# Patient Record
Sex: Male | Born: 1980
Health system: Southern US, Community
[De-identification: ages and names within clinical notes are randomized; demographics above are authoritative.]

## PROBLEM LIST (undated history)

## (undated) DIAGNOSIS — J45909 Unspecified asthma, uncomplicated: Secondary | ICD-10-CM

## (undated) DIAGNOSIS — B20 Human immunodeficiency virus [HIV] disease: Secondary | ICD-10-CM

## (undated) DIAGNOSIS — N186 End stage renal disease: Secondary | ICD-10-CM

## (undated) DIAGNOSIS — I10 Essential (primary) hypertension: Secondary | ICD-10-CM

## (undated) DIAGNOSIS — Z21 Asymptomatic human immunodeficiency virus [HIV] infection status: Secondary | ICD-10-CM

## (undated) HISTORY — DX: End stage renal disease: N18.6

## (undated) HISTORY — DX: Essential (primary) hypertension: I10

---

## 2017-04-28 ENCOUNTER — Inpatient Hospital Stay (HOSPITAL_COMMUNITY): Payer: Medicaid Other

## 2017-04-28 ENCOUNTER — Encounter (HOSPITAL_COMMUNITY): Payer: Self-pay | Admitting: Emergency Medicine

## 2017-04-28 ENCOUNTER — Emergency Department (HOSPITAL_COMMUNITY): Payer: Medicaid Other

## 2017-04-28 ENCOUNTER — Inpatient Hospital Stay (HOSPITAL_COMMUNITY)
Admission: EM | Admit: 2017-04-28 | Discharge: 2017-05-06 | DRG: 969 | Disposition: A | Discharge status: Home / Self Care | Payer: Medicaid Other | Attending: Oncology | Admitting: Oncology

## 2017-04-28 DIAGNOSIS — E860 Dehydration: Secondary | ICD-10-CM | POA: Diagnosis present

## 2017-04-28 DIAGNOSIS — R112 Nausea with vomiting, unspecified: Secondary | ICD-10-CM

## 2017-04-28 DIAGNOSIS — D631 Anemia in chronic kidney disease: Secondary | ICD-10-CM | POA: Diagnosis present

## 2017-04-28 DIAGNOSIS — R197 Diarrhea, unspecified: Secondary | ICD-10-CM

## 2017-04-28 DIAGNOSIS — K529 Noninfective gastroenteritis and colitis, unspecified: Secondary | ICD-10-CM | POA: Diagnosis present

## 2017-04-28 DIAGNOSIS — Z9114 Patient's other noncompliance with medication regimen: Secondary | ICD-10-CM | POA: Diagnosis not present

## 2017-04-28 DIAGNOSIS — I120 Hypertensive chronic kidney disease with stage 5 chronic kidney disease or end stage renal disease: Secondary | ICD-10-CM | POA: Diagnosis present

## 2017-04-28 DIAGNOSIS — R000 Tachycardia, unspecified: Secondary | ICD-10-CM | POA: Diagnosis not present

## 2017-04-28 DIAGNOSIS — J45909 Unspecified asthma, uncomplicated: Secondary | ICD-10-CM | POA: Diagnosis present

## 2017-04-28 DIAGNOSIS — N179 Acute kidney failure, unspecified: Secondary | ICD-10-CM | POA: Diagnosis present

## 2017-04-28 DIAGNOSIS — I10 Essential (primary) hypertension: Secondary | ICD-10-CM

## 2017-04-28 DIAGNOSIS — J948 Other specified pleural conditions: Secondary | ICD-10-CM

## 2017-04-28 DIAGNOSIS — A419 Sepsis, unspecified organism: Secondary | ICD-10-CM | POA: Diagnosis present

## 2017-04-28 DIAGNOSIS — J181 Lobar pneumonia, unspecified organism: Secondary | ICD-10-CM

## 2017-04-28 DIAGNOSIS — R1084 Generalized abdominal pain: Secondary | ICD-10-CM | POA: Diagnosis not present

## 2017-04-28 DIAGNOSIS — J189 Pneumonia, unspecified organism: Secondary | ICD-10-CM | POA: Diagnosis present

## 2017-04-28 DIAGNOSIS — N184 Chronic kidney disease, stage 4 (severe): Secondary | ICD-10-CM | POA: Diagnosis not present

## 2017-04-28 DIAGNOSIS — I471 Supraventricular tachycardia: Secondary | ICD-10-CM | POA: Diagnosis present

## 2017-04-28 DIAGNOSIS — D649 Anemia, unspecified: Secondary | ICD-10-CM | POA: Diagnosis not present

## 2017-04-28 DIAGNOSIS — B20 Human immunodeficiency virus [HIV] disease: Secondary | ICD-10-CM | POA: Diagnosis present

## 2017-04-28 DIAGNOSIS — N186 End stage renal disease: Secondary | ICD-10-CM | POA: Diagnosis present

## 2017-04-28 DIAGNOSIS — Z8701 Personal history of pneumonia (recurrent): Secondary | ICD-10-CM

## 2017-04-28 DIAGNOSIS — Z419 Encounter for procedure for purposes other than remedying health state, unspecified: Secondary | ICD-10-CM

## 2017-04-28 DIAGNOSIS — E871 Hypo-osmolality and hyponatremia: Secondary | ICD-10-CM | POA: Diagnosis present

## 2017-04-28 DIAGNOSIS — Z992 Dependence on renal dialysis: Secondary | ICD-10-CM | POA: Diagnosis not present

## 2017-04-28 DIAGNOSIS — R509 Fever, unspecified: Secondary | ICD-10-CM

## 2017-04-28 DIAGNOSIS — E872 Acidosis: Secondary | ICD-10-CM | POA: Diagnosis present

## 2017-04-28 DIAGNOSIS — R109 Unspecified abdominal pain: Secondary | ICD-10-CM

## 2017-04-28 DIAGNOSIS — R319 Hematuria, unspecified: Secondary | ICD-10-CM | POA: Diagnosis present

## 2017-04-28 HISTORY — DX: Essential (primary) hypertension: I10

## 2017-04-28 HISTORY — DX: Asymptomatic human immunodeficiency virus (hiv) infection status: Z21

## 2017-04-28 HISTORY — DX: Unspecified asthma, uncomplicated: J45.909

## 2017-04-28 HISTORY — DX: Human immunodeficiency virus (HIV) disease: B20

## 2017-04-28 LAB — CBC WITH DIFFERENTIAL/PLATELET
BASOS ABS: 0 10*3/uL (ref 0.0–0.1)
Basophils Relative: 0 %
EOS ABS: 0 10*3/uL (ref 0.0–0.7)
Eosinophils Relative: 0 %
HEMATOCRIT: 26.7 % — AB (ref 39.0–52.0)
HEMOGLOBIN: 9.2 g/dL — AB (ref 13.0–17.0)
LYMPHS PCT: 13 %
Lymphs Abs: 2.4 10*3/uL (ref 0.7–4.0)
MCH: 29.5 pg (ref 26.0–34.0)
MCHC: 34.5 g/dL (ref 30.0–36.0)
MCV: 85.6 fL (ref 78.0–100.0)
MONOS PCT: 12 %
Monocytes Absolute: 2.2 10*3/uL — ABNORMAL HIGH (ref 0.1–1.0)
NEUTROS ABS: 13.9 10*3/uL — AB (ref 1.7–7.7)
NEUTROS PCT: 75 %
Platelets: 150 10*3/uL (ref 150–400)
RBC: 3.12 MIL/uL — ABNORMAL LOW (ref 4.22–5.81)
RDW: 15 % (ref 11.5–15.5)
WBC: 18.5 10*3/uL — ABNORMAL HIGH (ref 4.0–10.5)

## 2017-04-28 LAB — GASTROINTESTINAL PANEL BY PCR, STOOL (REPLACES STOOL CULTURE)
ASTROVIRUS: NOT DETECTED
Adenovirus F40/41: NOT DETECTED
CAMPYLOBACTER SPECIES: NOT DETECTED
CRYPTOSPORIDIUM: NOT DETECTED
CYCLOSPORA CAYETANENSIS: NOT DETECTED
ENTEROTOXIGENIC E COLI (ETEC): NOT DETECTED
Entamoeba histolytica: NOT DETECTED
Enteroaggregative E coli (EAEC): NOT DETECTED
Enteropathogenic E coli (EPEC): NOT DETECTED
Giardia lamblia: NOT DETECTED
Norovirus GI/GII: NOT DETECTED
PLESIMONAS SHIGELLOIDES: NOT DETECTED
Rotavirus A: NOT DETECTED
SAPOVIRUS (I, II, IV, AND V): NOT DETECTED
SHIGA LIKE TOXIN PRODUCING E COLI (STEC): NOT DETECTED
Salmonella species: NOT DETECTED
Shigella/Enteroinvasive E coli (EIEC): NOT DETECTED
VIBRIO SPECIES: NOT DETECTED
Vibrio cholerae: NOT DETECTED
YERSINIA ENTEROCOLITICA: NOT DETECTED

## 2017-04-28 LAB — I-STAT CG4 LACTIC ACID, ED
LACTIC ACID, VENOUS: 1.68 mmol/L (ref 0.5–1.9)
Lactic Acid, Venous: 1.8 mmol/L (ref 0.5–1.9)

## 2017-04-28 LAB — URINALYSIS, ROUTINE W REFLEX MICROSCOPIC
Bilirubin Urine: NEGATIVE
Glucose, UA: NEGATIVE mg/dL
Ketones, ur: NEGATIVE mg/dL
LEUKOCYTES UA: NEGATIVE
Nitrite: NEGATIVE
Specific Gravity, Urine: 1.015 (ref 1.005–1.030)
pH: 7.5 (ref 5.0–8.0)

## 2017-04-28 LAB — C DIFFICILE QUICK SCREEN W PCR REFLEX
C DIFFICILE (CDIFF) INTERP: NOT DETECTED
C DIFFICILE (CDIFF) TOXIN: NEGATIVE
C Diff antigen: NEGATIVE

## 2017-04-28 LAB — CREATININE, URINE, RANDOM: CREATININE, URINE: 114.63 mg/dL

## 2017-04-28 LAB — COMPREHENSIVE METABOLIC PANEL
ALBUMIN: 2.5 g/dL — AB (ref 3.5–5.0)
ALT: 14 U/L — ABNORMAL LOW (ref 17–63)
ANION GAP: 16 — AB (ref 5–15)
AST: 33 U/L (ref 15–41)
Alkaline Phosphatase: 124 U/L (ref 38–126)
BUN: 102 mg/dL — AB (ref 6–20)
CHLORIDE: 97 mmol/L — AB (ref 101–111)
CO2: 15 mmol/L — ABNORMAL LOW (ref 22–32)
Calcium: 8.5 mg/dL — ABNORMAL LOW (ref 8.9–10.3)
Creatinine, Ser: 15.14 mg/dL — ABNORMAL HIGH (ref 0.61–1.24)
GFR calc Af Amer: 4 mL/min — ABNORMAL LOW (ref >60)
GFR calc non Af Amer: 4 mL/min — ABNORMAL LOW (ref >60)
GLUCOSE: 119 mg/dL — AB (ref 65–99)
POTASSIUM: 4.4 mmol/L (ref 3.5–5.1)
Sodium: 128 mmol/L — ABNORMAL LOW (ref 135–145)
TOTAL PROTEIN: 8.2 g/dL — AB (ref 6.5–8.1)
Total Bilirubin: 0.6 mg/dL (ref 0.3–1.2)

## 2017-04-28 LAB — URINALYSIS, MICROSCOPIC (REFLEX)

## 2017-04-28 LAB — SODIUM, URINE, RANDOM: SODIUM UR: 42 mmol/L

## 2017-04-28 LAB — LIPASE, BLOOD: Lipase: 132 U/L — ABNORMAL HIGH (ref 11–51)

## 2017-04-28 LAB — PROTIME-INR
INR: 1.16
PROTHROMBIN TIME: 14.9 s (ref 11.4–15.2)

## 2017-04-28 LAB — POC OCCULT BLOOD, ED: Fecal Occult Bld: NEGATIVE

## 2017-04-28 MED ORDER — ONDANSETRON HCL 4 MG PO TABS
4.0000 mg | ORAL_TABLET | Freq: Four times a day (QID) | ORAL | Status: DC | PRN
  Administered 2017-04-30 – 2017-05-03 (×2): 4 mg via ORAL
  Filled 2017-04-28: qty 1

## 2017-04-28 MED ORDER — PANTOPRAZOLE SODIUM 40 MG IV SOLR
40.0000 mg | Freq: Once | INTRAVENOUS | Status: AC
  Administered 2017-04-28: 40 mg via INTRAVENOUS
  Filled 2017-04-28: qty 40

## 2017-04-28 MED ORDER — SODIUM CHLORIDE 0.9 % IV BOLUS (SEPSIS)
1000.0000 mL | Freq: Once | INTRAVENOUS | Status: AC
  Administered 2017-04-28: 1000 mL via INTRAVENOUS

## 2017-04-28 MED ORDER — PIPERACILLIN-TAZOBACTAM 3.375 G IVPB 30 MIN
3.3750 g | Freq: Once | INTRAVENOUS | Status: AC
  Administered 2017-04-28: 3.375 g via INTRAVENOUS
  Filled 2017-04-28: qty 50

## 2017-04-28 MED ORDER — HYDROMORPHONE HCL 1 MG/ML IJ SOLN
0.5000 mg | INTRAMUSCULAR | Status: AC | PRN
  Administered 2017-04-28 – 2017-04-29 (×3): 0.5 mg via INTRAVENOUS
  Filled 2017-04-28 (×3): qty 0.5

## 2017-04-28 MED ORDER — GI COCKTAIL ~~LOC~~
30.0000 mL | Freq: Three times a day (TID) | ORAL | Status: DC | PRN
  Administered 2017-04-29: 30 mL via ORAL
  Filled 2017-04-28 (×2): qty 30

## 2017-04-28 MED ORDER — PIPERACILLIN-TAZOBACTAM 3.375 G IVPB
3.3750 g | Freq: Two times a day (BID) | INTRAVENOUS | Status: DC
  Administered 2017-04-28 – 2017-05-03 (×9): 3.375 g via INTRAVENOUS
  Filled 2017-04-28 (×11): qty 50

## 2017-04-28 MED ORDER — NEPRO/CARBSTEADY PO LIQD
237.0000 mL | Freq: Two times a day (BID) | ORAL | Status: DC
  Filled 2017-04-28 (×4): qty 237

## 2017-04-28 MED ORDER — ORAL CARE MOUTH RINSE
15.0000 mL | Freq: Two times a day (BID) | OROMUCOSAL | Status: DC
  Administered 2017-04-28 – 2017-05-05 (×12): 15 mL via OROMUCOSAL

## 2017-04-28 MED ORDER — VANCOMYCIN HCL 10 G IV SOLR
2000.0000 mg | Freq: Once | INTRAVENOUS | Status: AC
  Administered 2017-04-28: 2000 mg via INTRAVENOUS
  Filled 2017-04-28: qty 2000

## 2017-04-28 MED ORDER — ZOLPIDEM TARTRATE 5 MG PO TABS
5.0000 mg | ORAL_TABLET | Freq: Every evening | ORAL | Status: DC | PRN
  Administered 2017-04-28 – 2017-05-01 (×4): 5 mg via ORAL
  Filled 2017-04-28 (×4): qty 1

## 2017-04-28 MED ORDER — HEPARIN SODIUM (PORCINE) 5000 UNIT/ML IJ SOLN
5000.0000 [IU] | Freq: Three times a day (TID) | INTRAMUSCULAR | Status: DC
  Administered 2017-04-28 – 2017-05-06 (×22): 5000 [IU] via SUBCUTANEOUS
  Filled 2017-04-28 (×23): qty 1

## 2017-04-28 MED ORDER — SODIUM CHLORIDE 0.9% FLUSH
3.0000 mL | Freq: Two times a day (BID) | INTRAVENOUS | Status: DC
  Administered 2017-04-28 – 2017-05-05 (×11): 3 mL via INTRAVENOUS

## 2017-04-28 MED ORDER — ACETAMINOPHEN 650 MG RE SUPP: 650.0000 mg | Freq: Four times a day (QID) | RECTAL | Status: DC | PRN

## 2017-04-28 MED ORDER — ONDANSETRON HCL 4 MG/2ML IJ SOLN
4.0000 mg | Freq: Four times a day (QID) | INTRAMUSCULAR | Status: DC | PRN
  Administered 2017-04-29: 4 mg via INTRAVENOUS
  Filled 2017-04-28 (×2): qty 2

## 2017-04-28 MED ORDER — SODIUM CHLORIDE 0.9 % IV BOLUS (SEPSIS)
500.0000 mL | Freq: Once | INTRAVENOUS | Status: AC
  Administered 2017-04-28: 500 mL via INTRAVENOUS

## 2017-04-28 MED ORDER — ACETAMINOPHEN 325 MG PO TABS
650.0000 mg | ORAL_TABLET | Freq: Four times a day (QID) | ORAL | Status: DC | PRN
  Administered 2017-04-28 – 2017-05-06 (×3): 650 mg via ORAL
  Filled 2017-04-28 (×2): qty 2

## 2017-04-28 MED ORDER — SODIUM BICARBONATE 8.4 % IV SOLN
INTRAVENOUS | Status: DC
  Administered 2017-04-28 – 2017-04-29 (×3): via INTRAVENOUS
  Filled 2017-04-28 (×8): qty 150

## 2017-04-28 MED ORDER — GI COCKTAIL ~~LOC~~
30.0000 mL | Freq: Once | ORAL | Status: AC
Start: 2017-04-28 — End: 2017-04-28
  Administered 2017-04-28: 30 mL via ORAL
  Filled 2017-04-28: qty 30

## 2017-04-28 MED ORDER — ALBUTEROL SULFATE (2.5 MG/3ML) 0.083% IN NEBU
2.5000 mg | INHALATION_SOLUTION | RESPIRATORY_TRACT | Status: DC | PRN
  Administered 2017-04-28: 2.5 mg via RESPIRATORY_TRACT
  Filled 2017-04-28 (×2): qty 3

## 2017-04-28 NOTE — ED Notes (Signed)
Hospitalist at bedside 

## 2017-04-28 NOTE — ED Provider Notes (Signed)
[facility]EMER[facility]ENCY DEPT Provider Note   CSN: [id number] Arrival date & time: 04/28/17  [id number]     History   Chief Complaint Chief Complaint  Patient presents with  . Shortness of Breath    HPI James Schwartz is a 36 y.o. male.   He presents for evaluation of frequent episodes of nausea, vomiting, diarrhea. Vomiting. Appears like whatever he ate. Stool is like water and "black." No reported fever, chills, diaphoresis, chest pain, shortness of breath, or abdominal pain. He has HIV disease and states he is compliant with his medications. He is visiting from [place], here in [place]. There are no other known modifying factors.   HPI  Past Medical History:  Diagnosis Date  . Asthma   . HIV (human immunodeficiency virus infection) (HCC)     There are no active problems to display for this patient.   History reviewed. No pertinent surgical history.     Home Medications    Prior to Admission medications   Not on File    Family History History reviewed. No pertinent family history.  Social History Social History  Substance Use Topics  . Smoking status: Never Smoker  . Smokeless tobacco: Never Used  . Alcohol use No     Allergies   Patient has no known allergies.   Review of Systems Review of Systems  All other systems reviewed and are negative.    Physical Exam Updated Vital Signs BP (!) 151/83   Pulse (!) 124   Temp (!) 102.8 F (39.3 C) (Oral)   Resp (!) 29   Ht 6\' 1"  (1.854 m)   Wt 136.1 kg (300 lb)   SpO2 100%   BMI 39.58 kg/m   Physical Exam  Constitutional: He is oriented to person, place, and time. He appears well-developed. No distress.  Appears older than stated age.  HENT:  Head: Normocephalic and atraumatic.  Right Ear: External ear normal.  Left Ear: External ear normal.  Eyes: Pupils are equal, round, and reactive to light. Conjunctivae and EOM are normal.  Neck: Normal range of motion and phonation normal. Neck supple.    Cardiovascular: Normal rate, regular rhythm and normal heart sounds.   Pulmonary/Chest: Effort normal and breath sounds normal. He exhibits no bony tenderness.  Abdominal: Soft. He exhibits no distension. There is no tenderness. There is no guarding.  Musculoskeletal: Normal range of motion.  Neurological: He is alert and oriented to person, place, and time. No cranial nerve deficit or sensory deficit. He exhibits normal muscle tone. Coordination normal.  Skin: Skin is warm, dry and intact.  Psychiatric: He has a normal mood and affect. His behavior is normal. Judgment and thought content normal.  Nursing note and vitals reviewed.    ED Treatments / Results  Labs (all labs ordered are listed, but only abnormal results are displayed) Labs Reviewed  COMPREHENSIVE METABOLIC PANEL - Abnormal; Notable for the following:       Result Value   Sodium 128 (*)    Chloride 97 (*)    CO2 15 (*)    Glucose, Bld 119 (*)    BUN 102 (*)    Creatinine, Ser 15.14 (*)    Calcium 8.5 (*)    Total Protein 8.2 (*)    Albumin 2.5 (*)    ALT 14 (*)    GFR calc non Af Amer 4 (*)    GFR calc Af Amer 4 (*)    Anion gap 16 (*)    All other  components within normal limits  CBC WITH DIFFERENTIAL/PLATELET - Abnormal; Notable for the following:    WBC 18.5 (*)    RBC 3.12 (*)    Hemoglobin 9.2 (*)    HCT 26.7 (*)    Neutro Abs 13.9 (*)    Monocytes Absolute 2.2 (*)    All other components within normal limits  C DIFFICILE QUICK SCREEN W PCR REFLEX  CULTURE, BLOOD (ROUTINE X 2)  CULTURE, BLOOD (ROUTINE X 2)  URINE CULTURE  GASTROINTESTINAL PANEL BY PCR, STOOL (REPLACES STOOL CULTURE)  PROTIME-INR  URINALYSIS, ROUTINE W REFLEX MICROSCOPIC  I-STAT CG4 LACTIC ACID, ED  POC OCCULT BLOOD, ED  I-STAT CG4 LACTIC ACID, ED    EKG  EKG Interpretation  Date/Time:  Sunday April 28 2017 09:47:59 EDT Ventricular Rate:  150 PR Interval:    QRS Duration: 76 QT Interval:  264 QTC Calculation: 417 R  Axis:   33 Text Interpretation:  Sinus tachycardia Borderline T abnormalities, diffuse leads since last tracing no significant change Confirmed by Mancel Bale [phone]) on 04/28/2017 10:06:37 AM       Radiology Dg Chest Port 1 View  Result Date: 04/28/2017 CLINICAL DATA:  Shortness of breath.  Abdominal pain. EXAM: PORTABLE CHEST 1 VIEW COMPARISON:  None. FINDINGS: There is a focal opacity at the right base. The opacity is hazy and subpleural. Lung volumes are low. Cardiomegaly and vascular pedicle widening that is accentuated by low volumes and rotation. IMPRESSION: 1. Subpleural opacity at the right base with chart history of fever and sepsis favoring pneumonia over infarct. 2. Cardiomegaly and vascular pedicle widening accentuated by low volumes and rotation. Electronically Signed   By: Marnee Spring M.D.   On: 04/28/2017 10:24    Procedures Procedures (including critical care time)  Medications Ordered in ED Medications  vancomycin (VANCOCIN) 2,000 mg in sodium chloride 0.9 % 500 mL IVPB (2,000 mg Intravenous New Bag/Given 04/28/17 1133)  piperacillin-tazobactam (ZOSYN) IVPB 3.375 g (not administered)  sodium chloride 0.9 % bolus 1,000 mL (0 mLs Intravenous Stopped 04/28/17 1156)    And  sodium chloride 0.9 % bolus 1,000 mL (0 mLs Intravenous Stopped 04/28/17 1156)    And  sodium chloride 0.9 % bolus 1,000 mL (1,000 mLs Intravenous New Bag/Given 04/28/17 1133)    And  sodium chloride 0.9 % bolus 1,000 mL (1,000 mLs Intravenous New Bag/Given 04/28/17 1206)    And  sodium chloride 0.9 % bolus 500 mL (500 mLs Intravenous New Bag/Given 04/28/17 1217)  gi cocktail (Maalox,Lidocaine,Donnatal) (30 mLs Oral Given 04/28/17 1041)  pantoprazole (PROTONIX) injection 40 mg (40 mg Intravenous Given 04/28/17 1041)  piperacillin-tazobactam (ZOSYN) IVPB 3.375 g (0 g Intravenous Stopped 04/28/17 1133)     Initial Impression / Assessment and Plan / ED Course  I have reviewed the triage vital signs and the nursing  notes.  Pertinent labs & imaging results that were available during my care of the patient were reviewed by me and considered in my medical decision making (see chart for details).  Clinical Course as of Apr 28 1318  Sun Apr 28, 2017  1316 High WBC: (!) 18.5 [EW]  1317 Low Hemoglobin: (!) 9.2 [EW]  1317 Low Sodium: (!) 128 [EW]  1317 Low, consistent with acute renal failure CO2: (!) 15 [EW]  1317 High Creatinine: (!) 15.14 [EW]  1318 Sub-pleural mass, right DG Chest Port 1 View [EW]    Clinical Course User Index [EW] Mancel Bale, MD     Patient Vitals for the  past 24 hrs:  BP Temp Temp src Pulse Resp SpO2 Height Weight  04/28/17 1115 (!) 151/83 - - (!) 124 (!) 29 100 % - -  04/28/17 1000 (!) 143/111 - - (!) 146 (!) 23 94 % - -  04/28/17 0946 - - - - - - 6\' 1"  (1.854 m) 136.1 kg (300 lb)  04/28/17 0933 (!) 157/95 (!) 102.8 F (39.3 C) Oral (!) 135 (!) 27 100 % - -      1:08 PM Reevaluation with update and discussion. After initial assessment and treatment, an updated evaluation reveals No change in clinical status.Mancel Bale. Javeon Macmurray L    1:16 PM-Consult complete with On-call teaching service resident. Patient case explained and discussed. He agrees to admit patient for further evaluation and treatment. Call ended at 14:45  Final Clinical Impressions(s) / ED Diagnoses   Final diagnoses:  Sepsis, due to unspecified organism (HCC)  AKI (acute kidney injury) (HCC)  Nausea vomiting and diarrhea  HIV (human immunodeficiency virus infection) (HCC)  Pleural mass   Nausea vomiting and diarrhea with fever. Patient with acute renal failure, likely dehydrated. Baseline creatinine, prior labs in [place], last year, the creatinine was 4. Suspected acute on chronic renal failure. Initial lactate normal. Patient will require admission for monitoring.  Nursing Notes Reviewed/ Care Coordinated Applicable Imaging Reviewed Interpretation of Laboratory Data incorporated into ED  treatment   Plan: Admit   New Prescriptions New Prescriptions   No medications on file     Mancel BaleWentz, Toua Stites, MD 04/28/17 1530

## 2017-04-28 NOTE — Progress Notes (Signed)
Patient arrived on unit via stretcher from ED.  No family at bedside.  Telemetry placed per MD order and CMT notified.  

## 2017-04-28 NOTE — H&P (Signed)
Date: 04/28/2017               Patient Name:  James Schwartz MRN: [id number]  DOB: 1981-07-20 Age / Sex: 36 y.o., male   PCP: No primary care provider on file.              Medical Service: Internal Medicine Teaching Service              Attending Physician: Dr. Cyndie ChimeGranfortuna, Genene ChurnJames M, Schwartz    First Contact: James GeraldsKalyani Avva, MS OMS-IV Pager: [phone]  Second Contact: Dr. Gwynn BurlyAndrew Marayah Schwartz Pager: [phone]            After Hours (After 5p/  First Contact Pager: [phone]  weekends / holidays): Second Contact Pager: [phone]   Chief Complaint: Abdominal Pain, Diarrhea, Vomitting  History of Present Illness: James Schwartz is a 36 yo [place] Male with a medical history significant for HIV, Chronic Kidney Disease, and asthma. [facility] with a 6 day history of abdominal pain, diarrhea, vomiting. Patient is from [place] and currently visiting his wife in [place]. He said that his symptoms began on 7/31, one day after his arrival and evening meal at a seafood buffet.   He states that his pain is a 6/10 and has gotten better since he has been in the hospital. He has had poor oral intake during this time and has had a fever and chills.  He states that he has been having up to 10 loose bowel movements a day, which are void of blood and mucus. He denies any hematemesis. His symptoms are also accompanied by a cough. He is also having right peri-scapular pain.   He also states that he has been non-compliant with his anti-retrovirals for the past 2 months. He denies any chest pain, difficulty breathing, new-onset weakness,or headache.    While in the ER, he was febrile (tmax 102.8) and tachycardic ( 146bpm) and had a leukocytosis 18.5. CXR revealed an opacity at the base of the lower lung. Blood cultures were drawn, and he was started on vancomycin and zosyn and given 4.5 L of NS .  UA was unremarkable for infectious causes, but significant for hematuria and proteinuria His BUN was 102 and  creatinine is 15.12 (labs from [place] 1 year ago show creatinine of ~ 4). He was admitted to the general medical floor for sepsis secondary to acute gastroenteritis as well as acute on chronic renal failure.  Meds: Current Facility-Administered Medications  Medication Dose Route Frequency Provider Last Rate Last Dose  . piperacillin-tazobactam (ZOSYN) IVPB 3.375 g  3.375 g Intravenous Q12H Deja, Roderic Scarcerin N, RPH       No current outpatient prescriptions on file.    Allergies: Allergies as of 04/28/2017  . (No Known Allergies)   Past Medical History:  Diagnosis Date  . Asthma   . HIV (human immunodeficiency virus infection) (HCC)    History reviewed. No pertinent surgical history. History reviewed. No pertinent family history. Social History   Social History  . Marital status: Single    Spouse name: N/A  . Number of children: N/A  . Years of education: N/A   Occupational History  . Not on file.   Social History Main Topics  . Smoking status: Never Smoker  . Smokeless tobacco: Never Used  . Alcohol use No  . Drug use: No  . Sexual activity: Not on file   Other Topics Concern  . Not on file   Social History  Narrative  . No narrative on file    Review of Systems: Pertinent items noted in HPI and remainder of comprehensive ROS otherwise negative.  Physical Exam: Blood pressure (!) 166/120, pulse (!) 125, temperature 100 F (37.8 C), temperature source Oral, resp. rate (!) 26, height 6\' 1"  (1.854 m), weight 136.1 kg (300 lb), SpO2 96 %. BP (!) 166/120   Pulse (!) 125   Temp 100 F (37.8 C) (Oral)   Resp (!) 26   Ht 6\' 1"  (1.854 m)   Wt 136.1 kg (300 lb)   SpO2 96%   BMI 39.58 kg/m  General appearance: alert, cooperative, mild distress and mildly obese Head: Normocephalic, without obvious abnormality, atraumatic Eyes: PERLA. Sclera anicteric Ears: normal TM's and external ear canals both ears Throat: lips, mucosa, and tongue normal; teeth and gums normal Neck: no  adenopathy and supple, symmetrical, trachea midline Lungs: clear to auscultation bilaterally Heart: tachycardic, S1 S2 present, no murmurs rubs or gallops Abdomen: soft, Bowel sounds present . Tenderness to palpation ellicited in epigastric region Extremities: extremities normal, atraumatic, no cyanosis or edema   Imaging results:  Dg Chest Port 1 View  Result Date: 04/28/2017 CLINICAL DATA:  Shortness of breath.  Abdominal pain. EXAM: PORTABLE CHEST 1 VIEW COMPARISON:  None. FINDINGS: There is a focal opacity at the right base. The opacity is hazy and subpleural. Lung volumes are low. Cardiomegaly and vascular pedicle widening that is accentuated by low volumes and rotation. IMPRESSION: 1. Subpleural opacity at the right base with chart history of fever and sepsis favoring pneumonia over infarct. 2. Cardiomegaly and vascular pedicle widening accentuated by low volumes and rotation. Electronically Signed   By: Marnee SpringJonathon  Watts M.D.   On: 04/28/2017 10:24    Other results: EKG: sinus tachycardia.  Assessment & Plan by Problem:  Sepsis Secondary to Acute Gastroenteritis   Patient presented with a fever,tachycardia, leukocytosis accompanied by a 6 day history of gastrointestinal symptoms. GI panel ordered to evaluate potential infectious causes. Lipase for potential pancreatitis. Blood cultures were drawn. C.diff toxin was negative.  Patient on empiric zosyn and discontinue Vancomycin, will deescalate based on culture and sensitivity results. PRN Gi coctails Monitoring CBC and CMP daily. Vitals Q6hrs.   Acute on chronic renal failure  Patient's creatinine was 15.1 today up from 3.9 in March 2017. He states known chronic kidney disease. Possibly due to sepsis or ATN. Renal ultrasound consistent with medical renal disease. Patient still able to urinate.  Given severity of disease and lack of prior history nephrology will be consulted tomorrow morning.  No current indication for emergent HD. Holding  all nephrotoxic medications. Monitoring BMP daily.  Strict I/Os. It is unclear what is current baseline creatinine is.  Continue IVF to treat underlying pre-renal physiology that may be contributing to renal failure.  Pneumonia   CXR upon admission revealed possible right lower lobe opacity. Patients sepsis could be due to pneumonia but gastroenteritis is more likely due to patients presenting symptoms. Checking pneumococcal.  Patient on empiric zosyn for now. Checking legionella antigen due to the patients respiratory symptoms and diarrhea.   Normocytic Anemia   Patients Hgb was 9.2 on admission with an MCV of 85.6. Given patient HIV status and chronic kidney disease, anemia most likely due to chronic disease rather than IDA given no reports of blood loss.  Hgb was noted to be 13 in March 2017.   Will save smear for review.  Anion Gap Acidosis   On admission  Bicarb levels  were 15 and his anion gap was 16 indicating an anion gap acidosis. Most likely due to his underlying uremia. Will monitor Bmp daily.  Treat acidosis with NaHco3 gtt  HIV    Patient stated that he was diagnosed in 2002. Currently non-compliant with anti-retroviral therapy. Checking HIV viral load and CD4 count to establish disease burden. Last CD4 48 and VL > 113,000 in March 2017 available through Care Everywhere.  He is unsure of his medication regimen that he should be on.   Disposition: Admit patient to inpatient floor. Estimated length of stay >2days. Will consider discharge once the patient has clinical and symptomatic improvement.   This is a Psychologist, occupational Note.  The care of the patient was discussed with Dr. Earlene Plater and the assessment and plan was formulated with their assistance.  Please see their note for official documentation of the patient encounter.   Signed: Harvie Junior, Medical Student 04/28/2017, 4:24 PM   Attestation for Student Documentation:  I personally was present and performed or re-performed the  history, physical exam and medical decision-making activities of this service and have verified that the service and findings are accurately documented in the student's note.  James Burly, DO 04/28/2017, 6:49 PM

## 2017-04-28 NOTE — ED Triage Notes (Signed)
Pt from home- SOB for several days. N/V/D. Pt went out to eat on Monday and started feeling these symptoms afterwards. Pt has hx asthma. EMS gave total 10 mg albuterol and 0.5mg  atrovent. 4mg  zofran po. BP 150/122, HR 132, 97% on room air, CBG 114. EMS reports wheezing.

## 2017-04-28 NOTE — Progress Notes (Signed)
Pharmacy Antibiotic Note  James PercyMaurice Schwartz is a 36 y.o. male admitted on 04/28/2017 with sepsis.  Pharmacy has been consulted for vancomycin and Zosyn dosing. CMP and CBC still in progress. Tmax 102.37F.  Plan: Vancomycin 2 g IV x1 Zosyn 3.375 g IV x1 F/u lab results and determine maintenance dosing  ADDENDUM: Scr resulted 15.14, est CrCl ~2810mL/min.  Will not schedule vancomycin at this time. F/u renal function, may check a random level. Will schedule zosyn 3.375 g IV q12h (4 hour infusion). Monitor renal function for improvement and dose adjust accordingly.  Height: 6\' 1"  (185.4 cm) Weight: 300 lb (136.1 kg) IBW/kg (Calculated) : 79.9  Temp (24hrs), Avg:102.8 F (39.3 C), Min:102.8 F (39.3 C), Max:102.8 F (39.3 C)   Recent Labs Lab 04/28/17 0958  LATICACIDVEN 1.68    CrCl cannot be calculated (No order found.).    No Known Allergies  Microbiology results: 8/5 BCx:  8/5 UCx:  8/5 GI panel:   8/5 C.diff PCR:   Thank you for allowing pharmacy to be a part of this patient's care.   Roderic ScarceErin N. Zigmund Danieleja, PharmD PGY1 Pharmacy Resident Pager: [phone]  04/28/2017 10:02 AM

## 2017-04-28 NOTE — ED Notes (Signed)
Patient transported to Ultrasound 

## 2017-04-28 NOTE — ED Notes (Signed)
Pt aware he needs to provide urine sample. Pt informed RN he urinated when he defecated and was unable to get sample. Will try again as soon as possible. RN bladder scanned pt- 0mL as pt has just urinated

## 2017-04-29 ENCOUNTER — Inpatient Hospital Stay (HOSPITAL_COMMUNITY): Payer: Medicaid Other

## 2017-04-29 HISTORY — PX: IR US GUIDE VASC ACCESS RIGHT: IMG2390

## 2017-04-29 HISTORY — PX: IR FLUORO GUIDE CV LINE RIGHT: IMG2283

## 2017-04-29 LAB — CBC WITH DIFFERENTIAL/PLATELET
BASOS PCT: 0 %
Band Neutrophils: 1 %
Basophils Absolute: 0 10*3/uL (ref 0.0–0.1)
Blasts: 0 %
EOS PCT: 1 %
Eosinophils Absolute: 0.1 10*3/uL (ref 0.0–0.7)
HEMATOCRIT: 23.6 % — AB (ref 39.0–52.0)
HEMOGLOBIN: 8 g/dL — AB (ref 13.0–17.0)
Lymphocytes Relative: 7 %
Lymphs Abs: 0.8 10*3/uL (ref 0.7–4.0)
MCH: 28.9 pg (ref 26.0–34.0)
MCHC: 33.9 g/dL (ref 30.0–36.0)
MCV: 85.2 fL (ref 78.0–100.0)
MONO ABS: 0.8 10*3/uL (ref 0.1–1.0)
MYELOCYTES: 0 %
Metamyelocytes Relative: 0 %
Monocytes Relative: 7 %
NEUTROS PCT: 84 %
NRBC: 0 /100{WBCs}
Neutro Abs: 9 10*3/uL — ABNORMAL HIGH (ref 1.7–7.7)
OTHER: 0 %
PROMYELOCYTES ABS: 0 %
Platelets: 127 10*3/uL — ABNORMAL LOW (ref 150–400)
RBC: 2.77 MIL/uL — AB (ref 4.22–5.81)
RDW: 15.2 % (ref 11.5–15.5)
WBC: 10.7 10*3/uL — AB (ref 4.0–10.5)

## 2017-04-29 LAB — RENAL FUNCTION PANEL
ANION GAP: 13 (ref 5–15)
Albumin: 2 g/dL — ABNORMAL LOW (ref 3.5–5.0)
BUN: 101 mg/dL — ABNORMAL HIGH (ref 6–20)
CALCIUM: 7.8 mg/dL — AB (ref 8.9–10.3)
CHLORIDE: 102 mmol/L (ref 101–111)
CO2: 18 mmol/L — AB (ref 22–32)
Creatinine, Ser: 14.37 mg/dL — ABNORMAL HIGH (ref 0.61–1.24)
GFR calc non Af Amer: 4 mL/min — ABNORMAL LOW (ref >60)
GFR, EST AFRICAN AMERICAN: 4 mL/min — AB (ref >60)
GLUCOSE: 106 mg/dL — AB (ref 65–99)
POTASSIUM: 4.4 mmol/L (ref 3.5–5.1)
Phosphorus: 7.3 mg/dL — ABNORMAL HIGH (ref 2.5–4.6)
SODIUM: 133 mmol/L — AB (ref 135–145)

## 2017-04-29 LAB — URINE CULTURE

## 2017-04-29 LAB — HIV-1 RNA QUANT-NO REFLEX-BLD
HIV 1 RNA Quant: 258000 copies/mL
LOG10 HIV-1 RNA: 5.412 {Log_copies}/mL

## 2017-04-29 LAB — LEGIONELLA PNEUMOPHILA SEROGP 1 UR AG: L. pneumophila Serogp 1 Ur Ag: NEGATIVE

## 2017-04-29 LAB — T-HELPER CELLS (CD4) COUNT (NOT AT ARMC)
CD4 % Helper T Cell: 2 % — ABNORMAL LOW (ref 33–55)
CD4 T Cell Abs: 30 /uL — ABNORMAL LOW (ref 400–2700)

## 2017-04-29 LAB — SAVE SMEAR

## 2017-04-29 MED ORDER — NITROGLYCERIN 0.4 MG SL SUBL
SUBLINGUAL_TABLET | SUBLINGUAL | Status: AC
  Administered 2017-04-29: 21:00:00
  Administered 2017-04-29: 0.4 mg
  Filled 2017-04-29: qty 1

## 2017-04-29 MED ORDER — DEXTROSE 5 % IV SOLN
5.0000 mg/h | INTRAVENOUS | Status: DC
  Administered 2017-04-29: 5 mg/h via INTRAVENOUS
  Administered 2017-04-30: 15 mg/h via INTRAVENOUS
  Administered 2017-05-01: 10 mg/h via INTRAVENOUS
  Filled 2017-04-29 (×7): qty 100

## 2017-04-29 MED ORDER — AZITHROMYCIN 250 MG PO TABS
250.0000 mg | ORAL_TABLET | Freq: Every day | ORAL | Status: DC
  Administered 2017-04-30: 250 mg via ORAL
  Filled 2017-04-29: qty 1

## 2017-04-29 MED ORDER — HEPARIN SODIUM (PORCINE) 1000 UNIT/ML IJ SOLN
INTRAMUSCULAR | Status: AC
  Administered 2017-04-29: 2.8 mL
  Filled 2017-04-29: qty 1

## 2017-04-29 MED ORDER — LIDOCAINE HCL (PF) 1 % IJ SOLN
INTRAMUSCULAR | Status: DC | PRN
  Administered 2017-04-29: 10 mL

## 2017-04-29 MED ORDER — AZITHROMYCIN 500 MG PO TABS
500.0000 mg | ORAL_TABLET | Freq: Every day | ORAL | Status: AC
  Administered 2017-04-29: 500 mg via ORAL
  Filled 2017-04-29: qty 1

## 2017-04-29 MED ORDER — LIDOCAINE HCL (PF) 1 % IJ SOLN
INTRAMUSCULAR | Status: AC
  Filled 2017-04-29: qty 30

## 2017-04-29 MED ORDER — DM-GUAIFENESIN ER 30-600 MG PO TB12
1.0000 | ORAL_TABLET | Freq: Two times a day (BID) | ORAL | Status: DC
  Administered 2017-04-29 – 2017-05-06 (×15): 1 via ORAL
  Filled 2017-04-29 (×15): qty 1

## 2017-04-29 MED ORDER — SEVELAMER CARBONATE 800 MG PO TABS
800.0000 mg | ORAL_TABLET | Freq: Three times a day (TID) | ORAL | Status: DC
  Administered 2017-04-29 – 2017-05-06 (×16): 800 mg via ORAL
  Filled 2017-04-29 (×17): qty 1

## 2017-04-29 NOTE — Progress Notes (Signed)
Attempted to call report at 3W x1.

## 2017-04-29 NOTE — Procedures (Signed)
  Procedure:   R IJ HD catheter temporary Preprocedure diagnosis:  Renal failure Postprocedure diagnosis:  same EBL:     minimal Complications:   none immediate  See full dictation in [facility] PACS.  Thora Lance. Llewyn Heap MD Main # [phone] Pager  [phone]

## 2017-04-29 NOTE — Consult Note (Signed)
James Schwartz Admit Date: 04/28/2017 04/29/2017 Noemi Chapel, DO  [facility] Consultation note Requesting Physician:  Cyndie Chime, MD Reason for Consult:  Acute on Chronic renal failure suspect new ESRD  HPI:  36 y/o M visiting [place] from [place] with Mhx significant for HIV (current CD4 count 30), CKD stage 4, and asthma admitted 04/28/17 with 6 day history of epigastric abdominal pain, vomiting and diarrhea. Admits to up to 10 episodes of watery black stools per day, emesis (no blood) and abdominal pain. Notes this all started 1 day after eating at a seafood buffet however no other diners are ill. Has had poor PO intake since symptom onset and has felt febrile. He notes 2 month history of anti-retroviral non-compliance as he didn't feel like taking them. His wife lives here in [place].   He endorses a month history of decreased appetite, early morning nausea, fatigue and itching. Chart review shows creatinine of 3.9 11/2015. He states he had no idea he had kidney disease and has never seen a nephrologist. Has never had dialysis or been hospitalized for kidney issues. Takes tylenol or ASA occasionally for headache. No recent contrast or antibiotic exposure. Urinates 8-10 times a day. He denies any confusion. Endorses intermittent swelling after walking long distances for "some time."   On admission he was febrile to 102.8, tachycardic to 146, respirations 29 and hypertensive at 157/95. He was saturating 100% on RA. CMET with sodium of 128, CO2 of 15, Cr 15, BUN 102 and estimated GFR of 4. CBC with leukocytosis to 18.5 and had a normocytic anemia with Hb of 9.2 and MCV at 85.6. UA without infection however did show TNTC RBC and >300 protenuria. Lactic acid normal. CXR concerning for right basilar pneumonia and mild cardiomegaly. Blood cultures were obtained and he was subsequently given 4L IVF and started on Vancomycin and Zosyn. He was admitted to the IM teaching service for further evaluation. Nephrology has been asked to  evaluate.   NSAIDS: Tylenol or ASA rarely IV Contrast: no recent TMP/SMX: No recent Hypotension: No recent  PMH Incudes:  HIV  CKD  HTN  Anemia  Asthma  Creatinine, Ser (mg/dL)  Date Value  16/06/9603 14.37 (H)  04/28/2017 15.14 (H)  PSH History reviewed. No pertinent surgical history. FH History reviewed. No pertinent family history. SH  reports that he has never smoked. He has never used smokeless tobacco. He reports that he does not drink alcohol or use drugs.  ROS per HPI  I/Os: I/O last 3 completed shifts: In: 5341.7 [P.O.:720; I.V.:871.7; IV Piggyback:3750] Out: 525 [Urine:525]  Physical Exam  Blood pressure (!) 168/102, pulse (!) 107, temperature 99.2 F (37.3 C), temperature source Oral, resp. rate 18, height 6\' 1"  (1.854 m), weight 266 lb 15.6 oz (121.1 kg), SpO2 100 %. GEN: Frustrated [name] although NAD. Alert and oriented x3. Poor health literacy.  CV: Tachycardic, rate about 110. Otherwise regular.  PULM: Crackles RLL, otherwise clear. Able to speak in complete sentences. Normal WOB on RA. ABD: Soft. TTP epigastrum however otherwise non-tender. Not distended. +bs SKIN: warm, perfused. Not diaphoretic.  EXT: No peripheral edema appreciated. 2+ distal pulses.   Allergies  Allergen Reactions  . Tomato Other (See Comments)    Mouth sores   Prior to Admission medications   Medication Sig Start Date End Date Taking? Authorizing Provider  Ibuprofen-Diphenhydramine Cit (ADVIL PM PO) Take 2 tablets by mouth at bedtime as needed (sleep).   Yes [provider]  azithromycin (ZITHROMAX) 600 MG tablet Take  600 mg by mouth. 03/18/17   [provider]  darunavir (PREZISTA) 800 MG tablet Take 800 mg by mouth daily.    [provider]  dolutegravir (TIVICAY) 50 MG tablet Take 50 mg by mouth daily.    [provider]  NIFEdipine (PROCARDIA-XL/ADALAT-CC/NIFEDICAL-XL) 30 MG 24 hr tablet Take 30 mg by mouth daily. 03/18/17   [provider]  rilpivirine (EDURANT) 25 MG TABS tablet Take 25 mg by mouth daily with breakfast.    [provider]  ritonavir (NORVIR) 100 MG TABS tablet Take 100 mg by mouth daily. 03/18/17   [provider]    Current Medications . [START ON 04/30/2017] azithromycin  250 mg Oral Daily  . dextromethorphan-guaiFENesin  1 tablet Oral BID  . feeding supplement (NEPRO CARB STEADY)  237 mL Oral BID BM  . heparin  5,000 Units Subcutaneous Q8H  . mouth rinse  15 mL Mouth Rinse BID  . sodium chloride flush  3 mL Intravenous Q12H   Continuous Infusions: . piperacillin-tazobactam (ZOSYN)  IV 3.375 g (04/29/17 1002)  .  sodium bicarbonate  infusion 1000 mL 100 mL/hr at 04/29/17 1001   PRN Meds:.acetaminophen **OR** acetaminophen, albuterol, gi cocktail, HYDROmorphone (DILAUDID) injection, ondansetron **OR** ondansetron (ZOFRAN) IV, zolpidem   Recent Labs Lab 04/28/17 0949 04/29/17 0549  WBC 18.5* 10.7*  NEUTROABS 13.9* 9.0*  HGB 9.2* 8.0*  HCT 26.7* 23.6*  MCV 85.6 85.2  PLT 150 127*   Basic Metabolic Panel  Recent Labs Lab 04/28/17 0949 04/29/17 0549  NA 128* 133*  K 4.4 4.4  CL 97* 102  CO2 15* 18*  GLUCOSE 119* 106*  BUN 102* 101*  CREATININE 15.14* 14.37*  CALCIUM 8.5* 7.8*  PHOS  --  7.3*   Assessment & Plan: This is a 36 y/o M with poorly controlled HTN and HIV here with acute on chronic renal failure in setting of acute GI illness and RLL PNA. Likely new ESRD. Currently placing temp access as patient remains febrile however ordering vein mapping in anticipation of graft vs fistula placement.   Renal: Acute on Chronic Renal Failure in setting of HIV and poorly controlled HTN. Likely new ESRD.  Cr 15 on admission, now 14.37 and is producing urine. He complains of uremic symptoms including nausea, itching and fatigue for approximately 1 month. Renal US with chronic medical renal disease kidneys bilaterally small and echodense. Cr historically 3.9 in 2017  at which time his HIV was poorly (but better) controlled. He states he was unaware of his history of renal disease despite 2 recent [place] [facility] [place] [facility] [place] [place] as recently as February of this year [place] [facility] UA with significant proteinuria and RBCs. BUN elevated however patient denies any history of confusion or AMS, further suggesting chronicity of renal disease. This is likely new ESRD.  -Try to obtain records from outside hospitals, would be helpful to know more background -No renal biopsy as kidneys small and scarred, wont alter treatment plan -Good discussion was had with the patient and his wife. He has poor health literacy and will require multiple rounds of education on the matter. Have asked RN to show ESRD videos.  -Will need emergency medicare application, importance discussed with wife -Temp cath placement as patient currently febrile with pneumonia, he will need tunneled HD cath after resolution of fever. Per IR.  -Order vein mapping. Discussed with patient need for permanent access (graft vs fistula) -Urine protein/cr ratio -Hep B and C screens in anticipation  of HD  Electrolytes: Metabolic acidosis, hyperphosphatemia Phosphate 7.3. Corrected calcium normal -PTH -Start Renvela -Bicarb replacement pending initiation of HD  Hematologic: Normocytic anemia, Thrombocytopenia Hb 8.0 today. Will check iron studies. No schistocytes appreciated on initial smear review and bilirubin normal. Likely secondary to pneumonia. Monitor.  -CBC in AM -Iron studies, replace as indicated  Infectious: RLL PNA, Advanced HIV Disease with CD4 of 30 Remains febrile to 102 today. On Zosyn and azithromycin. Per primary.   Noemi Chapel, DO Internal Medicine - PGY2 04/29/2017, 4:22 PM  I have seen and examined this patient and agree with plan and assessment in the above note with renal recommendations/intervention highlighted. AAM with HIV (poorly controlled) HTN  (historically also poorly controlled) admitted with febrile illness c/w PNA, noted to have BUN/creatinine 100/15, metabolic acidosis, anemia, and sx that I think are referable to uremia. Kidneys bilaterally small and echodense so despite unknown if renal failure HIV related (vs 2/2 poorly controlled HTN), with proteinuria and hematuria, renal bx not appropriate. Temp cath to be placed until fever resolved, cultures neg, etc then will require [facility] and AVF/AVG, emergency [place] medicaid and CLIP for outpt HD. He has EXTREMELY low health literacy and will require MUCH explanation for every step along the way.   Jamine Wingate B,MD 04/29/2017 7:52 PM

## 2017-04-29 NOTE — Progress Notes (Signed)
Subjective:  Patient was seen and examined at bedside today. Appears more lethargic. Patient reports vomiting, weakness and fatigue. He says the pain in his abdomen is gone and he now just has pain in his upper back on the right side. He denies any chest pain.   Objective: Vital signs in last 24 hours: Vitals:   04/29/17 0315 04/29/17 0428 04/29/17 0539 04/29/17 0945  BP: (!) 149/93  (!) 150/102 (!) 168/102  Pulse: (!) 109  (!) 104 (!) 107  Resp: (!) 24  20 18   Temp: 100.2 F (37.9 C)  99.3 F (37.4 C) 99.2 F (37.3 C)  TempSrc: Oral  Oral Oral  SpO2: 99%  100% 100%  Weight:  121.1 kg (266 lb 15.6 oz)    Height:        Intake/Output Summary (Last 24 hours) at 04/29/17 1039 Last data filed at 04/29/17 0600  Gross per 24 hour  Intake          5341.67 ml  Output              525 ml  Net          4816.67 ml   BP (!) 168/102 (BP Location: Left Arm)   Pulse (!) 107   Temp 99.2 F (37.3 C) (Oral)   Resp 18   Ht 6' 1"  (1.854 m)   Wt 121.1 kg (266 lb 15.6 oz)   SpO2 100%   BMI 35.22 kg/m  General appearance: alert and mild distress Head: Normocephalic, without obvious abnormality, atraumatic Throat: lips, mucosa, and tongue normal; teeth and gums normal Neck: no adenopathy and supple, symmetrical, trachea midline Lungs: dullness to percussion base - right and rales base - right Heart: Tachycardic, S1 and S2 present no murmurs rubs or gallops  Abdomen: Soft,Bowel Sounds Present, tenderness to palpation appriciated in the epigastric resion   Assessment/Plan:  Acute on Chronic Renal Failure  Patient's creatinine trended down today 14.37 from 15.1. Patient exhibits no signs of volume overload or altered mentation. Seeing that the patient's GI panel was negative, the patients gastrointestinal symptoms are most likely due to uremia vs acute gastroenteritis.  Nephrology (Dr.[place]) was consulted and given the patients high creatinine and low urine output over the last 24 hours he  likely has ESRD and needs dialysis. IR to  Place an IJ due to risk of infection of a tunneled dialysis catheter given the patient's recent pneumonia.  Monitoring BMP daily.   Sepsis Secondary to Community Acquired Pneumonia of the Right Middle Lobe Repeat CXR this morning revealed pneumonia of the right middle lobe. Patient is in no respiratory distress.  Currently treating with Zosyn and azithromycin. Legionella antigen still pending. Closely monitoring CBC and vital signs      HIV  Patient has been HIV positive since 2002. Currently not on anti-retroviral therapy. CD4 T cell count was 30. Will initiate  prophylaxis for MAC   with azithromycin 1200 once weekly, after current course of azithromycin. Prophylaxis for toxoplasmosis and PCP with tmp-smx 400/80 daily, once dialysis is initiated. Will consult Infectious diseases to determine appropriate course of antiretroviral medications.   Anion Gap Acidosis  Today, bicarb was 18 and the anion gap was 13. Initial acidosis was secondary to underlying uremia and dehydration.  Problem resolved through through patient's treatment with sodium bicarb in d5w @100ml /hr.   This is a Careers information officer Note.  The care of the patient was discussed with Dr.Granfortuna and the assessment and plan formulated with their assistance.  Please see their attached note for official documentation of the daily encounter.   LOS: 1 day   James Schwartz, James Schwartz, Medical Student 04/29/2017, 10:39 AM

## 2017-04-29 NOTE — Progress Notes (Signed)
Medicine attending: I was called from the radiology department to report that [name] who was down getting a temporary dialysis catheter placed had a rapid heart rate over 150.  I instructed the staff to transport him back to his room on 6 E. On exam he is comfortable, in no respiratory distress, eating his dinner. Blood pressure 149/111, pulse 160, rapid, regular, respiratory rate 18, oxygen saturation 98% on room air, temperature 98.5. Decreased breath sounds one third up right hemithorax no change from a.m. exam.  New temporary dialysis catheter right neck.  Rapid regular cardiac rhythm. He is alert awake and oriented 3.  Cardiac monitor: Rapid atrial arrhythmia  EKG: SVT at 150/min  Portable chest x-ray to rule out pneumothorax pending but this is clinically unlikely.  Impression: Acute supraventricular tachycardia Risk factors: Acute renal failure and right middle lobe pneumonia.  No suspicion for pulmonary embolus.  Plan: We will transfer him to a stepdown or cardiology monitored bed.  Begin Cardizem infusion.  Cardiology consultation if needed. Patient examined together with resident physician Dr. Valentino NoseNathan Boswell.  Patient's wife present.  Status and management plan discussed.

## 2017-04-29 NOTE — Significant Event (Signed)
Rapid Response Event Note  Overview:  Called to assist with patient with elevated HR. Time Called: 1815 Arrival Time: 1830 Event Type: Cardiac  Initial Focused Assessment:  Sitting in bed - NAD - denies CP, SOB or palpitations - very cautious about anyone caring for him - skin warm and dry - alert - temp HD cath intact right neck.  Bil BS clear.  Patient on RA.  Needs Cardizem drip started.  Bicarb gtt infusing left PIV - right antecubital PIV out of vein.  Patient got out of bed with 6E staff to go to the bathroom - denied SOB or dizziness with OOB.   Interventions:  Placed back in bed.  No attempt to restart 2nd PIV due to poor venous access.  Call to IV team.  Left PIV antecubital patent - Bicarb drip placed on hold - flushed line - Cardizem gtt started at 5 mg.  BP 138/97  HR 171 RR 16 O2 sats 99%.   Patient rate slowed to 106 at 1920.  Handoff to [employer] IncLeslie RN for transfer to 3W.  Remains asx.    Plan of Care (if not transferred):  Event Summary: Name of Physician Notified: Dr. Cyndie ChimeGranfortuna at  (pta RRT)    at    Outcome: Transferred (Comment) (3w cardiac tele)  Event End Time:  (handoff to pm RRT at 1900 for tx 3 west)  [place]itt, Dakwan Pridgen L

## 2017-04-29 NOTE — Progress Notes (Signed)
Received a call from CCMD that pt HR is at 170's while at IR, this nurse notified IR. Pt was transported back to the unit with VS as follows: 149/111, HR 171, rapid, regular, RR of 18, 02 at 98% on room air, temp of 98.5. Rapid response notified. Dr. Cyndie ChimeGranfortuna at bedside. Transfer order in place.

## 2017-04-29 NOTE — Progress Notes (Signed)
Initial Nutrition Assessment  DOCUMENTATION CODES:   Severe malnutrition in context of acute illness/injury, Obesity unspecified  INTERVENTION:  - Continue Nepro Shake po BID, each supplement provides 425 kcal and 19 grams protein  NUTRITION DIAGNOSIS:   Malnutrition (Severe) related to acute illness (Nausea and Vomiting) as evidenced by mild depletion of muscle mass, energy intake < or equal to 50% for > or equal to 5 days, moderate depletions of muscle mass.  GOAL:   Patient will meet greater than or equal to 90% of their needs  MONITOR:   PO intake, Supplement acceptance, Labs, I & O's  REASON FOR ASSESSMENT:   Malnutrition Screening Tool    ASSESSMENT:   Pt with PMH of HIV, CKD, and asthma presents with acute on chronic renal failure. Pt reports ongoing nausea, vomiting, and diarrhea.    Pt reports only eating the rice on his dinner tray last evening. Pt reports he has not eaten more than bites for almost the past 7 days r/t recent N/V and diarrhea. Per chart review pt has consumed 0-25% of meals since admission.   Pt states prior to this week, he was eating well eggs, bacon, sausage for breakfast; lunch and dinner vary such as sandwiches and side or pork chops with sides. Pt states he eats [place] with [place] compared to when [place] is staying in [place].  Pt reports recent weight loss of 100 lbs in the past year. Pt reports a UBW of 400 but was unsure of the last time he weighed this. Pt unable to provide specifics No recent weight history in chart.   Per chart review, Nephrology consulted pt likely with ESRD and may need HD  Pt reports not being offered Nepro but is amenable to supplementation.   Labs reviewed; Na (133), Phosphorus (7.3) Medications reviewed; GI cocktail (Maalox, Lidocaine, Donnatal), Zofran, 100 mL Sodium bicarbonate in D5  Nutrition focused physical exam completed. Findings include no fat depletion, mild to moderate  muscle depletion and no edema.  Diet Order:  Diet renal with fluid restriction Fluid restriction: 1200 mL Fluid; Room service appropriate? Yes; Fluid consistency: Thin  Skin:  Reviewed, no issues  Last BM:  04/28/17  Height:   Ht Readings from Last 1 Encounters:  04/28/17 6\' 1"  (1.854 m)    Weight:   Wt Readings from Last 1 Encounters:  04/29/17 266 lb 15.6 oz (121.1 kg)    Ideal Body Weight:  83.6 kg  BMI:  Body mass index is 35.22 kg/m.  Estimated Nutritional Needs:   Kcal:  2800-3000  Protein:  145-168 grams  Fluid:  <1.2 L/d  EDUCATION NEEDS:   Education needs no appropriate at this time   Fransisca Kaufmannllison Ioannides, RDN 04/29/2017 3:28 PM

## 2017-04-30 ENCOUNTER — Encounter (HOSPITAL_COMMUNITY): Payer: Self-pay | Admitting: Interventional Radiology

## 2017-04-30 DIAGNOSIS — R1084 Generalized abdominal pain: Secondary | ICD-10-CM

## 2017-04-30 DIAGNOSIS — N186 End stage renal disease: Secondary | ICD-10-CM

## 2017-04-30 DIAGNOSIS — R112 Nausea with vomiting, unspecified: Secondary | ICD-10-CM

## 2017-04-30 DIAGNOSIS — R197 Diarrhea, unspecified: Secondary | ICD-10-CM

## 2017-04-30 DIAGNOSIS — R000 Tachycardia, unspecified: Secondary | ICD-10-CM

## 2017-04-30 DIAGNOSIS — B20 Human immunodeficiency virus [HIV] disease: Secondary | ICD-10-CM

## 2017-04-30 DIAGNOSIS — Z992 Dependence on renal dialysis: Secondary | ICD-10-CM

## 2017-04-30 LAB — IRON AND TIBC: Iron: 57 ug/dL (ref 45–182)

## 2017-04-30 LAB — CBC
HEMATOCRIT: 22.5 % — AB (ref 39.0–52.0)
Hemoglobin: 7.7 g/dL — ABNORMAL LOW (ref 13.0–17.0)
MCH: 28.4 pg (ref 26.0–34.0)
MCHC: 34.2 g/dL (ref 30.0–36.0)
MCV: 83 fL (ref 78.0–100.0)
Platelets: 123 10*3/uL — ABNORMAL LOW (ref 150–400)
RBC: 2.71 MIL/uL — ABNORMAL LOW (ref 4.22–5.81)
RDW: 14.8 % (ref 11.5–15.5)
WBC: 8.7 10*3/uL (ref 4.0–10.5)

## 2017-04-30 LAB — RENAL FUNCTION PANEL
Albumin: 1.9 g/dL — ABNORMAL LOW (ref 3.5–5.0)
Anion gap: 13 (ref 5–15)
BUN: 100 mg/dL — AB (ref 6–20)
CO2: 21 mmol/L — AB (ref 22–32)
Calcium: 7.5 mg/dL — ABNORMAL LOW (ref 8.9–10.3)
Chloride: 102 mmol/L (ref 101–111)
Creatinine, Ser: 14.64 mg/dL — ABNORMAL HIGH (ref 0.61–1.24)
GFR calc Af Amer: 4 mL/min — ABNORMAL LOW (ref >60)
GFR, EST NON AFRICAN AMERICAN: 4 mL/min — AB (ref >60)
GLUCOSE: 100 mg/dL — AB (ref 65–99)
POTASSIUM: 3.8 mmol/L (ref 3.5–5.1)
Phosphorus: 7.9 mg/dL — ABNORMAL HIGH (ref 2.5–4.6)
Sodium: 136 mmol/L (ref 135–145)

## 2017-04-30 LAB — MRSA PCR SCREENING: MRSA by PCR: NEGATIVE

## 2017-04-30 LAB — FERRITIN: Ferritin: 1403 ng/mL — ABNORMAL HIGH (ref 24–336)

## 2017-04-30 LAB — TSH: TSH: 2.288 u[IU]/mL (ref 0.350–4.500)

## 2017-04-30 MED ORDER — DARUNAVIR-COBICISTAT 800-150 MG PO TABS
1.0000 | ORAL_TABLET | Freq: Every day | ORAL | Status: DC
  Administered 2017-05-01 – 2017-05-05 (×5): 1 via ORAL
  Filled 2017-04-30 (×6): qty 1

## 2017-04-30 MED ORDER — WHITE PETROLATUM GEL
Status: AC
  Filled 2017-04-30: qty 1

## 2017-04-30 MED ORDER — DOLUTEGRAVIR SODIUM 50 MG PO TABS
50.0000 mg | ORAL_TABLET | Freq: Every day | ORAL | Status: DC
  Administered 2017-05-01 – 2017-05-05 (×5): 50 mg via ORAL
  Filled 2017-04-30 (×6): qty 1

## 2017-04-30 MED ORDER — DARBEPOETIN ALFA 100 MCG/0.5ML IJ SOSY
100.0000 ug | PREFILLED_SYRINGE | INTRAMUSCULAR | Status: DC
  Administered 2017-05-01: 100 ug via INTRAVENOUS
  Filled 2017-04-30: qty 0.5

## 2017-04-30 MED ORDER — SULFAMETHOXAZOLE-TRIMETHOPRIM 400-80 MG PO TABS
1.0000 | ORAL_TABLET | Freq: Every day | ORAL | Status: DC
  Administered 2017-05-01 – 2017-05-05 (×5): 1 via ORAL
  Filled 2017-04-30 (×7): qty 1

## 2017-04-30 MED ORDER — RILPIVIRINE HCL 25 MG PO TABS
25.0000 mg | ORAL_TABLET | Freq: Every day | ORAL | Status: DC
  Administered 2017-05-01 – 2017-05-05 (×5): 25 mg via ORAL
  Filled 2017-04-30 (×6): qty 1

## 2017-04-30 MED ORDER — BOOST / RESOURCE BREEZE PO LIQD
1.0000 | Freq: Three times a day (TID) | ORAL | Status: DC
  Administered 2017-04-30 – 2017-05-01 (×4): 1 via ORAL

## 2017-04-30 NOTE — Progress Notes (Signed)
Medicine attending: He developed a supraventricular arrhythmia yesterday at time of placement of a temporary dialysis catheter in the right internal jugular position.  He was started on a Cardizem infusion and reverted back into sinus rhythm.  While we were watching his cardiac monitor on rounds, he did still have some intermittent SVT. Dialysis has been initiated and is currently in progress. He is still having diarrhea and is very uncomfortable. He is currently afebrile on Zosyn and azithromycin.  Blood and urine culture sterile so far. Impression: 1.  Pneumonia and HIV-positive man.  Clinically responding to current antibiotics. 2.  End-stage renal disease.  New diagnosis.  Getting first dialysis today. 3.  HIV/AIDS noncompliant with medication.  CD4 count 30.  We will ask our infectious disease experts for recommendations on appropriate anti-retroviral therapy. 4.  Acute onset SVT.  No associated hypoxia or fever at time of this event.  Possible atrial irritability related to placement of a dialysis catheter versus pericardial irritation from right middle lobe pneumonia versus contribution from uremia.  He reverted to sinus rhythm.  We should be able to transition him to oral Cardizem at this time.

## 2017-04-30 NOTE — Consult Note (Signed)
[name] for Infectious Disease    Reason for Consult: Recommendations for anti-retroviral therapy in patient with HIV/AIDS    Referring Physician: Dr. Beryle Beams  Recommendations: - Start Prezcobix (darunavir/cobicistat)  - Start Tivicay - avoid aluminum, magnesium, iron, and calcium coadmistration with Tivicay unless he is eating full meal - Start [place] - patient will need chewable meal with Edurant and should not take antacids/acid suppression medications.  - Start Bactrim for PCP prophylaxis - Continue treating CAP, can consider completing treatment at 5 days should the patient remain stable. Consider discontinuing azithromycin.  - Will see if insurance can fill Juluca (Tivicay/Edurant combination) that he will continue on discharge - Follow up HIV genotyping, INSTI testing, Hep B/C, and HLA B701 - Patient will follow with our ID clinic  Assessment: This 36 yo male with a history of HIV, CKD stage IV/V, and HTN presents with pneumonia, acute on chronic renal failure, and possible GI infection. Patient has CD4 of 30, helper T cell 2% and HIV viral load to 258,000. He has a long-standing history of antiretroviral noncompliance, which he attributes to medication side effects and ambivalence about being treated for the disease. Unfortunately, very little is known about the patient's past HIV treatment history, we are attempting to retrieve medical records from [place]. Patient denies any recent hospitalizations, but there is concern for continued deterioration should he continue untreated. Reported prescriptions include Prezista, Norvir, Tivicay, and Edurant, he may have been instructed to take all 4 but this is unclear. This is a rather uncommon regimen and raises suspicion about resistance vs complications from prior medications, including but not limited to nephrotoxicity and hypersensitivity reaction. Absence of therapy with NRTIs may be attributable to the aforementioned. In  accordance with this, we are considering starting patient on Juluca (dolutegravir/rilpivirine) and Prezcobix (darunavir/cobicistat). Patient endorses willingness to reinitiate therapy. Patient will be able to follow with our ID clinic once he moves to [place].   Genotyping has been ordered along with INSTI testing, will follow up results.    Antibiotics: Vancomycin (8/5) Zosyn (8/5 - ) Azithromycin (8/6 - )  Principal Problem:   Acute renal failure (ARF) (HCC) Active Problems:   Hypertension   HIV disease ([place])   Asthma   Pneumonia   Abdominal pain   Diarrhea   Anemia   . azithromycin  250 mg Oral Daily  . dextromethorphan-guaiFENesin  1 tablet Oral BID  . feeding supplement (NEPRO CARB STEADY)  237 mL Oral BID BM  . heparin  5,000 Units Subcutaneous Q8H  . mouth rinse  15 mL Mouth Rinse BID  . sevelamer carbonate  800 mg Oral TID WC  . sodium chloride flush  3 mL Intravenous Q12H   HPI: James Schwartz is a 36 y.o.African American male with a history of HIV, CKD stage IV/V, and HTN who presented 6 day onset of abdominal pain, n/v, and diarrhea. On presentation on 8/5, patient was febrile to 102.8, tachycardic to 146, tachypneic to 23 with O2 sat of 94% on RA, BP at 143/11. WBC at 58.5 with neutrophilic predominance, lactate at 1.7. Chest XR showed R basilar pneumonia. Patient was started vancomycin (8/5 x1 dose), Zosyn (8/5 - ) and PO azithromycin (8/6 - ) and received a total of 4.5 L normal saline.  C diff and GI panel was negative, urine culture showed insignificant growth, blood cultures show no growth at 1 day x2. Legionella urine antigen negative. Hep B/C, strep pneumo urine antigen pending. Patient was also found to  be hyponatremic to 128, K normal at 4.4.  SCr was at 15.1 (3.9 in 11/2015 at OSH), BUN 102, bicarb 15. UA showed large Hgb with >300 protein, renal ultrasound showed small echogenic kidneys. Nephrology is following, s/p HD today via R IJ catheter. Patient has been  oligouric. CKD likely secondary to HIVAN vs hypertensive nephropathy.   Patient has been afebrile x24 hours, fever curve downtrending Patient with intermittent SVT to 140s-170s, patient on cardizem gtt for rate control. BPs have been stable. WBC improved to 8.7. He says he feels better today, denies chest pain, headache, shortness of breath. Denies abdominal pain, diarrhea, constipation.  Patient found to have CD4 of 30, helper T cell 2%  and HIV viral load to 258,000. Patient diagnosed with HIV in 2002 on screening, he reports initial compliance with anti-retroviral therapy. In the past few years, he reports sparsely taking his anti-retrovirals. He demonstrates very little recollection about his HIV treatment history. HIV was managed by Dr. Glean Salen at [facility]. Patient still lives in [place] but is planning to move to [place] soon. Anti-retrovirals in chart reported to be filled in [place] on 03/18/17 include include Prezista, Norvir, Tivicay, and Edurant, and although it is unclear which ones he was actively prescribed. He denies taking antiretroviral therapy in the past year. He was most recently seen ID at [facility] in 11/2015, he was found to have CD4 of 48 with 5% helper T cells and HIV viral load of 113,000.  Review of Systems: All other systems reviewed and are negative    Past Medical History:  Diagnosis Date  . Asthma   . HIV (human immunodeficiency virus infection) ([place])     Social History  Substance Use Topics  . Smoking status: Never Smoker  . Smokeless tobacco: Never Used  . Alcohol use No    History reviewed. No pertinent family history.  Allergies  Allergen Reactions  . Tomato Other (See Comments)    Mouth sores    Physical Exam: Constitutional: sitting in bed, no acute distress, conversant.  Vitals:   04/30/17 1015 04/30/17 1022  BP: (!) 160/87 (!) 141/98  Pulse: (!) 149 (!) 140  Resp: (!) 25 (!) 27  Temp:  98.4 F (36.9 C)   EYES: anicteric, conjunctivae  normal ENMT: MMM, no oral lesions Cardiovascular: tachycardic, S1 and S2 noted, no murmur Respiratory: mild scattered wheezes, decreased breath sounds at R base, normal work of breathing GI: soft, non-distended, non-tender, normoactive bowel sounds Musculoskeletal: full ROM in all extremities, trace edema in lower extremities bilaterally to shin Skin: no rash or lesions Neuro/Psych: Sensation grossly intact. Alert and oriented x3. Appropriate mood and affect  Lab Results  Component Value Date   WBC 8.7 04/30/2017   HGB 7.7 (L) 04/30/2017   HCT 22.5 (L) 04/30/2017   MCV 83.0 04/30/2017   PLT 123 (L) 04/30/2017    Lab Results  Component Value Date   CREATININE 14.64 (H) 04/30/2017   BUN 100 (H) 04/30/2017   NA 136 04/30/2017   K 3.8 04/30/2017   CL 102 04/30/2017   CO2 21 (L) 04/30/2017    Lab Results  Component Value Date   ALT 14 (L) 04/28/2017   AST 33 04/28/2017   ALKPHOS 124 04/28/2017     Microbiology: Recent Results (from the past 240 hour(s))  Culture, blood (Routine x 2)     Status: None (Preliminary result)   Collection Time: 04/28/17  9:43 AM  Result Value Ref Range Status   Specimen Description  BLOOD LEFT ANTECUBITAL  Final   Special Requests   Final    BOTTLES DRAWN AEROBIC AND ANAEROBIC Blood Culture adequate volume   Culture NO GROWTH 1 DAY  Final   Report Status PENDING  Incomplete  Culture, blood (Routine x 2)     Status: None (Preliminary result)   Collection Time: 04/28/17 10:15 AM  Result Value Ref Range Status   Specimen Description BLOOD RIGHT ANTECUBITAL  Final   Special Requests   Final    BOTTLES DRAWN AEROBIC AND ANAEROBIC Blood Culture adequate volume   Culture NO GROWTH 1 DAY  Final   Report Status PENDING  Incomplete  Gastrointestinal Panel by PCR , Stool     Status: None   Collection Time: 04/28/17 10:40 AM  Result Value Ref Range Status   Campylobacter species NOT DETECTED NOT DETECTED Final   Plesimonas shigelloides NOT DETECTED NOT  DETECTED Final   Salmonella species NOT DETECTED NOT DETECTED Final   Yersinia enterocolitica NOT DETECTED NOT DETECTED Final   Vibrio species NOT DETECTED NOT DETECTED Final   Vibrio cholerae NOT DETECTED NOT DETECTED Final   Enteroaggregative E coli (EAEC) NOT DETECTED NOT DETECTED Final   Enteropathogenic E coli (EPEC) NOT DETECTED NOT DETECTED Final   Enterotoxigenic E coli (ETEC) NOT DETECTED NOT DETECTED Final   Shiga like toxin producing E coli (STEC) NOT DETECTED NOT DETECTED Final   Shigella/Enteroinvasive E coli (EIEC) NOT DETECTED NOT DETECTED Final   Cryptosporidium NOT DETECTED NOT DETECTED Final   Cyclospora cayetanensis NOT DETECTED NOT DETECTED Final   Entamoeba histolytica NOT DETECTED NOT DETECTED Final   Giardia lamblia NOT DETECTED NOT DETECTED Final   Adenovirus F40/41 NOT DETECTED NOT DETECTED Final   Astrovirus NOT DETECTED NOT DETECTED Final   Norovirus GI/GII NOT DETECTED NOT DETECTED Final   Rotavirus A NOT DETECTED NOT DETECTED Final   Sapovirus (I, II, IV, and V) NOT DETECTED NOT DETECTED Final  C difficile quick scan w PCR reflex     Status: None   Collection Time: 04/28/17 10:40 AM  Result Value Ref Range Status   C Diff antigen NEGATIVE NEGATIVE Final   C Diff toxin NEGATIVE NEGATIVE Final   C Diff interpretation No C. difficile detected.  Final  Urine culture     Status: Abnormal   Collection Time: 04/28/17  2:47 PM  Result Value Ref Range Status   Specimen Description URINE, RANDOM  Final   Special Requests NONE  Final   Culture <10,000 COLONIES/mL INSIGNIFICANT GROWTH (A)  Final   Report Status 04/29/2017 FINAL  Final    Will Lannette Donath, MS4 04/30/2017, 10:57 AM

## 2017-04-30 NOTE — Progress Notes (Signed)
Admit Date: 04/28/2017 Bethany Molt, DO  [facility] Rounding note  SUBJECTIVE Brief HPI: 36 y/o M from [place] with uncontrolled HTN, HIV (current CD4 count 30), hx of CKD stage 4 (Cr 11/2015 3.9) now likely ESRD, and asthma admitted 04/28/17 with 6 day history of epigastric abdominal pain, vomiting and diarrhea. Other uremic syx x1 month. US with small echogenic kidneys. He has been non-compliant with his anti-retrovirals as he didn't feel like taking them. His [place] lives here in [place] and he is planning on staying in the area for HD.   Interval events: R IJ temp HD catheter placed yesterday evening. Developed SVT (rates to 170) about 1 hour later and was subsequently transferred to SDU and placed on cardizem. Rates better controlled. Seen after his first HD session and patient notes remarkable increase in his energy level.    OBJECTIVE Vitals: Blood pressure (!) 168/102, pulse (!) 107, temperature 99.2 F (37.3 C), temperature source Oral, resp. rate 18, height 6\' 1"  (1.854 m), weight 266 lb 15.6 oz (121.1 kg), SpO2 100 %. Physical Exam   GEN: [name] resting comfortably in bedside chair. NAD. Pleasant. Alert and oriented x3. Right temp HD cath placed 04/29/17. Mental status much improved.  CV: Tachycardic, rate about 110. Otherwise regular.  PULM: CTA BL. Able to speak in complete sentences. Normal WOB on RA. ABD: Soft. Not distended. +bs SKIN: warm, perfused. Not diaphoretic.  EXT: Minimal peripheral edema. 2+ distal pulses. Seen after first HD - INCREDIBLY more interactive, clearer MS wise  Recent Labs Lab 04/28/17 0949 04/29/17 0549 04/30/17 0408  WBC 18.5* 10.7* 8.7  NEUTROABS 13.9* 9.0*  --   HGB 9.2* 8.0* 7.7*  HCT 26.7* 23.6* 22.5*  MCV 85.6 85.2 83.0  PLT 150 127* 123*   Basic Metabolic Panel  Recent Labs Lab 04/28/17 0949 04/29/17 0549 04/30/17 0408  NA 128* 133* 136  K 4.4 4.4 3.8  CL 97* 102 102  CO2 15* 18* 21*  GLUCOSE 119* 106* 100*  BUN 102* 101* 100*  CREATININE 15.14*  14.37* 14.64*  CALCIUM 8.5* 7.8* 7.5*  PHOS  --  7.3* 7.9*   Current Medications . azithromycin  250 mg Oral Daily  . dextromethorphan-guaiFENesin  1 tablet Oral BID  . feeding supplement (NEPRO CARB STEADY)  237 mL Oral BID BM  . heparin  5,000 Units Subcutaneous Q8H  . mouth rinse  15 mL Mouth Rinse BID  . sevelamer carbonate  800 mg Oral TID WC  . sodium chloride flush  3 mL Intravenous Q12H   Continuous Infusions: . diltiazem (CARDIZEM) infusion 5 mg/hr (04/30/17 0149)  . piperacillin-tazobactam (ZOSYN)  IV Stopped (04/30/17 0417)  .  sodium bicarbonate  infusion 1000 mL 100 mL/hr at 04/29/17 2229   PRN Meds:.acetaminophen **OR** acetaminophen, albuterol, gi cocktail, lidocaine (PF), ondansetron **OR** ondansetron (ZOFRAN) IV, zolpidem   Intake/Output Summary (Last 24 hours) at 04/30/17 1207 Last data filed at 04/30/17 1022  Gross per 24 hour  Intake          2413.08 ml  Output              400 ml  Net          2013.08 ml   IMAGING:  CXR 04/29/17: Improving right basilar opacity consistent with improving pneumonia. Tip of R IJ catheter in expected location. No pneumothorax.   ASSESSMENT & PLAN Background: This is a 36 y/o M with poorly controlled HTN and HIV here with acute on chronic renal failure  in setting of acute GI illness and RLL PNA. Likely new ESRD. Has temp HD cath as pt febrile with PNA however ordering vein mapping in anticipation of graft vs fistula placement.   1. Acute on Chronic Renal Failure, Likely new ESRD: Right temp HD cath placed 8/6 and patient s/p first HD session 04/30/17. He feels remarkably improved and his mental status is much clearer. HIVAN vs hypertensive nephropathy? UA with proteinuria and hematuria. Renal US with BL small and echodense kidneys. Cr 15 on admission, now 14.6 despite good UOP. States he had been seen by [facility] in [place] who did not inform him of his kidney disease.  -S/p 1st HD today. Will repeat HD  sessions tomorrow and Thursday.  -Follow-up Pr/Cr ratio -Follow up vein mapping -Tunneled HD cath plus AVF once febrile illness improved consult to VVS after vein mapping completed -Follow up HepB/C  2. RLL Pneumonia: Demonstrated on CXR although follow-up in several weeks recommended to evaluate for underlying malignancy. On Zosyn and Azithromycin per primary. Afebrile overnight.  -ID on board -Continue tx per recs -Can place tunneled HD cath once febrile illness resolved  3. Advanced HIV Disease: Several months of anti-retroviral non-compliance. CD4 count now 30.  -initiate anti-retroviral per ID recs  4. Hyperphosphatemia: Phosphorus 7.9, was 7.3 yest. Corrected calcium normal. Started on Renvela 800mg  TID WC yesterday evening. PTH pending. Bicarb low however wait until HD initiated.  -PTH pending -Continue Renvela 800mg  TID WC  5. Normocytic anemia: Hb 7.7 today. Iron 47, saturation ratio pending. Ferritin 1403. Likely related to CKD-5 + hematuria.  -Start aranesp 100mg  Qwed HD, starting tomorrow -Follow CBC daily -Transfuse <7 -Follow-up iron sat, replace if indicated  Noemi Chapel, DO Internal Medicine - PGY2 04/30/2017, 8:43 AM   I have seen and examined this patient and agree with plan and assessment in the above note with renal recommendations/intervention highlighted. Looks QUITE a bit better after 1st HD. More alert, engaging. Says feels better. Will have HD again next 2 days the TIW. Starting CLIP process. Will need emergency Medicaid [place]. V Mapping ordered. Consult to VVS once v mapping done and can get [facility] and AVF after febrile illness resolves. Aranesp started. PTH pending.  Calayah Guadarrama B,MD 04/30/2017 12:35 PM

## 2017-04-30 NOTE — Plan of Care (Signed)
Problem: Activity: Goal: Risk for activity intolerance will decrease Outcome: Progressing Patient able with one assist to use BSC.

## 2017-04-30 NOTE — Progress Notes (Signed)
Subjective: Patient was seen and examined today. During insertion of IJ catheter for hemodialysis, patient went into SVT with Hr 170's. He was started on a diltiazem drip and transferred to the step-down unit. Patient received first hemodialysis session today. On exam, patient reported that he could occasionally feel his rapid heart rate and had some associated shortness of breath. He reports that his back pain is now 3/10. He also reports that he is seeing trace amounts of blood in his diarrhea. He denies any chest pain.  Objective: Vital signs in last 24 hours: Vitals:   04/30/17 0945 04/30/17 1000 04/30/17 1015 04/30/17 1022  BP: 130/84 (!) 149/100 (!) 160/87 (!) 141/98  Pulse: (!) 155 (!) 147 (!) 149 (!) 140  Resp: (!) 26 (!) 35 (!) 25 (!) 27  Temp:    98.4 F (36.9 C)  TempSrc:    Oral  SpO2: 94% 94% 97% 96%  Weight:    119.4 kg (263 lb 3.7 oz)  Height:       Weight change: -16.329 kg (-36 lb)  Intake/Output Summary (Last 24 hours) at 04/30/17 1550 Last data filed at 04/30/17 1400  Gross per 24 hour  Intake          2773.08 ml  Output              100 ml  Net          2673.08 ml   BP (!) 141/98 (BP Location: Right Arm)   Pulse (!) 140   Temp 98.4 F (36.9 C) (Oral)   Resp (!) 27   Ht _0  (1.854 m)   Wt 119.4 kg (263 lb 3.7 oz)   SpO2 96%   BMI 34.73 kg/m  General appearance: alert, distracted and moderately obese Head: Normocephalic, without obvious abnormality, atraumatic Lungs: diminished breath sounds bilaterally Heart: Tachycardic HR 180s Abdomen: soft, non-tender; bowel sounds normal; no masses,  no organomegaly Extremities: extremities normal, atraumatic, no cyanosis or edema Skin: Xerosis present across the entire body  Assessment/Plan:  Acute on Chronic Renal Failure  Patient started on dialysis today. Nephrology is following. Will monitor BMP closely for improving  creatinine  and electrolyte levels.  - Appreciate nephrology assistance - Started on  Renvela  - Aranesp started - Vein mapping ordered, will need VVS consultation once completed to get tunneled catheter and AVF  Supraventricular Tachycardia  During insertion of IJ catheter for hemodialysis, patient went into SVT with Hr 170's. Upon exam this afternoon patient still is experience SVT with HR in 180s, but last night he temporarily converted into sinus rhythm upon defecation. TSH within normal limits. - Continue diltiazem drip.  Titrate as HR and BP allows. Will transition to oral once patient experiences a sustained sinus rhythm.    Community Acquired Pneumonia of the Right Lower Lobe CXR post IJ revealed resolving right lower lobe pneumonia. Patient is currently afebrile and not in any respiratory distress. Continue azithromycin and zosyn for now, with an expected duration of treatment for 7 days.  - Follow up ID recommendations for de-escalating as appropriate  HIV  ID was consulted today and will see the patient for possible re-initation of anti-retroviral therapy. HIV-1 viral load was 258,000.  HIV genotype pending. Patient currently not on prophylaxis for PCP,Toxoplasmosis, and MAC     Diarrhea   Patient is still having persistent diarrhea, which he reports contains small amount blood. Symptoms are most likely due to uremia. GI panel negative. Due to patients significant immunocompromise will  order Ova and parasite study.   Disposition Will consider discharge once patients BUN and creatinine levels improve, and SVT is properly stabilized as well as he has a safe disposition to receive hemodialysis.  This is a Careers information officer Note.  The care of the patient was discussed with Dr.Yareni Creps and the assessment and plan formulated with their assistance.  Please see their attached note for official documentation of the daily encounter.   LOS: 2 days   Avva, Juanda Chance, Medical Student 04/30/2017, 3:50 PM   Attestation for Student Documentation:  I personally was present and  performed or re-performed the history, physical exam and medical decision-making activities of this service and have verified that the service and findings are accurately documented in the student's note.  Jule Ser, DO 04/30/2017, 4:27 PM
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
## 2017-05-01 ENCOUNTER — Encounter (HOSPITAL_COMMUNITY): Payer: Medicaid - Out of State

## 2017-05-01 DIAGNOSIS — N179 Acute kidney failure, unspecified: Secondary | ICD-10-CM

## 2017-05-01 LAB — PREPARE RBC (CROSSMATCH)

## 2017-05-01 LAB — RENAL FUNCTION PANEL
Albumin: 1.7 g/dL — ABNORMAL LOW (ref 3.5–5.0)
Anion gap: 10 (ref 5–15)
BUN: 67 mg/dL — ABNORMAL HIGH (ref 6–20)
CO2: 25 mmol/L (ref 22–32)
Calcium: 7 mg/dL — ABNORMAL LOW (ref 8.9–10.3)
Chloride: 103 mmol/L (ref 101–111)
Creatinine, Ser: 11.2 mg/dL — ABNORMAL HIGH (ref 0.61–1.24)
GFR calc Af Amer: 6 mL/min — ABNORMAL LOW
GFR calc non Af Amer: 5 mL/min — ABNORMAL LOW
Glucose, Bld: 92 mg/dL (ref 65–99)
Phosphorus: 5 mg/dL — ABNORMAL HIGH (ref 2.5–4.6)
Potassium: 3.7 mmol/L (ref 3.5–5.1)
Sodium: 138 mmol/L (ref 135–145)

## 2017-05-01 LAB — CBC
HCT: 20.1 % — ABNORMAL LOW (ref 39.0–52.0)
Hemoglobin: 6.8 g/dL — CL (ref 13.0–17.0)
MCH: 28.6 pg (ref 26.0–34.0)
MCHC: 33.8 g/dL (ref 30.0–36.0)
MCV: 84.5 fL (ref 78.0–100.0)
Platelets: 134 10*3/uL — ABNORMAL LOW (ref 150–400)
RBC: 2.38 MIL/uL — ABNORMAL LOW (ref 4.22–5.81)
RDW: 15 % (ref 11.5–15.5)
WBC: 7.8 10*3/uL (ref 4.0–10.5)

## 2017-05-01 LAB — PTH, INTACT AND CALCIUM
Calcium, Total (PTH): 7.4 mg/dL — ABNORMAL LOW (ref 8.7–10.2)
PTH: 247 pg/mL — AB (ref 15–65)

## 2017-05-01 LAB — HEPATITIS C ANTIBODY (REFLEX): HCV Ab: 0.1 s/co ratio (ref 0.0–0.9)

## 2017-05-01 LAB — HEPATITIS B SURFACE ANTIBODY,QUALITATIVE: Hep B S Ab: REACTIVE

## 2017-05-01 LAB — ABO/RH: ABO/RH(D): B POS

## 2017-05-01 LAB — HEPATITIS B SURFACE ANTIGEN: Hepatitis B Surface Ag: NEGATIVE

## 2017-05-01 LAB — HCV COMMENT:

## 2017-05-01 MED ORDER — SODIUM CHLORIDE 0.9 % IV SOLN
Freq: Once | INTRAVENOUS | Status: AC
  Administered 2017-05-01: 04:00:00 via INTRAVENOUS

## 2017-05-01 MED ORDER — DARBEPOETIN ALFA 100 MCG/0.5ML IJ SOSY
PREFILLED_SYRINGE | INTRAMUSCULAR | Status: AC
  Administered 2017-05-01: 100 ug via INTRAVENOUS
  Filled 2017-05-01: qty 0.5

## 2017-05-01 MED ORDER — RENA-VITE PO TABS
1.0000 | ORAL_TABLET | Freq: Every day | ORAL | Status: DC
  Administered 2017-05-01 – 2017-05-05 (×5): 1 via ORAL
  Filled 2017-05-01 (×7): qty 1

## 2017-05-01 MED FILL — JULUCA 50-25 MG TAB: 50-25 | 30 days supply | Qty: 30 | Fill #0

## 2017-05-01 MED FILL — SYMTUZA 800-150-200-10 MG T: 800-150-200 | 30 days supply | Qty: 30 | Fill #0
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
## 2017-05-01 NOTE — Progress Notes (Signed)
Subjective: The patient was seen and examined in the dialysis unit. Last night the patient's experienced another run of SVT into the 150's and his CBC came back with a hgb of 6.8,neccesetating a transfusion of 1 unit PRBCs.  He stated that he is feeling generally better this morning, and he has not had any diarrhea since yesterday afternoon. He is still experiencing occasional episodes of rapid heart rate with accompanying dyspnea. He denies any chest pain or vomiting.  Objective: Vital signs in last 24 hours: Vitals:   05/01/17 0836 05/01/17 0900 05/01/17 0930 05/01/17 1000  BP: 127/78 136/88 128/84 (!) 113/55  Pulse: 86 96 (!) 101 87  Resp:      Temp:      TempSrc:      SpO2:      Weight:      Height:       Weight change: -0.15 kg (-5.3 oz)  Intake/Output Summary (Last 24 hours) at 05/01/17 1014 Last data filed at 04/30/17 1700  Gross per 24 hour  Intake              480 ml  Output              200 ml  Net              280 ml   BP (!) 113/55   Pulse 87   Temp 98.7 F (37.1 C) (Oral)   Resp 18   Ht 6\' 1"  (1.854 m)   Wt 119.7 kg (264 lb)   SpO2 97%   BMI 34.83 kg/m  General appearance: alert, cooperative, no distress and moderately obese Head: Normocephalic, without obvious abnormality, atraumatic Lungs: diminished breath sounds bilaterally Heart: regular rate and rhythm, S1, S2 normal, no murmur, click, rub or gallop Extremities: extremities normal, atraumatic, no cyanosis or edema Skin: Skin color, texture, turgor normal. No rashes or lesions or Widespread Xerosis present.  Assessment/Plan: Acute on Chronic Renal Failure  Patient received second round of dialysis today. His creatinine improved to 11.2 from 14.67 and his acidosis has resolved. Per nephrology will receive another HD session tomorrow and vein mapping today, for more permanent HD access. We are monitoring his BUN,Creatinine and electrolyte closely.  He continues Renvela and Aranesp per  Neprhology.  Supraventricular Tachycardia  Patient was in sinus rhythm upon exam today but had a run of SVT overnight. Currently on 10mg /hr diltiazem drip. Will switch to oral therapy once he is no longer tachycardic for 24hrs. Patient on telemetry.     Community Acquired Pneumonia of the Right Lower Lobe   Patient is symptomatically improving. Legionella antigen was negative. Per ID, we can discontinue his azithromycin and continue the zosyn for 1 more days for a total therapy duration of 5 days.   HIV   ID consulted yesterday and patient was started on triple antiretroviral therapy( Prezcobix,Tivicay and Edurant). He is HepC negative and HepB immune. Due to this patient's hiatus from anti-retrovirals we will monitor him closely for symptoms of immune reconstitution inflammatory syndrome.  He is on Bactrim for PCP prophylaxis   Diarrhea   Per patient diarrhea is resolving. Most likely due to uremia. Ova and Parasite studies still pending.   Disposition:Will consider discharge once patients BUN and creatinine levels improve, and SVT is properly stabilized as well as he has a safe disposition to receive hemodialysis.  This is a Psychologist, occupationalMedical Student Note.  The care of the patient was discussed with Dr.Najla Aughenbaugh and the assessment and plan formulated  with their assistance.  Please see their attached note for official documentation of the daily encounter.   LOS: 3 days   Avva, Junius Finner, Medical Student 05/01/2017, 10:14 AM   Attestation for Student Documentation:  I personally was present and performed or re-performed the history, physical exam and medical decision-making activities of this service and have verified that the service and findings are accurately documented in the student's note.  Gwynn Burly, DO 05/01/2017, 11:19 AM

## 2017-05-01 NOTE — Progress Notes (Signed)
Date of Admission:  04/28/2017     Recommendations - Patient has been started on Prezcobix, Tivicay, and Edurant (8/8).  - Plan to change regimen to Juluca (dolutegravir/rilpivirine) with Symstuza (Darunavir/cobistat/emtricitabine/tenofovir alafenamide), which will be similar to current regimen with addition of 2 NRTIs in emtricitabine and tenofivir alafenamide. [place] Medicaid not able to cover medications in [place], but our ID pharmacy will be able to fill - Continue Bactrim for PCP and toxoplasmosis prophylaxis  - Continue treating CAP as per primary team - Follow up HIV genotyping, INSTI testing, and HLA B701 - Patient will follow with our ID clinic  Assessment This 36 yo male with a history of HIV, CKD stage IV/V, and HTN presents with pneumonia, acute on chronic renal failure, and possible GI infection.   #HIV/AIDS:  Patient has CD4 of 30, helper T cell 2% and HIV viral load to 258,000. He has a long-standing history of antiretroviral noncompliance, which he attributes to medication side effects and ambivalence about being treated for the disease. Patient had been following with Dr. Tomie China in [facility] in [place], [place]. Per chart, patient had prescription filled for Prezista, Norvir, Tivicay, and Edurant as of 03/18/17, none of which he has been taking. Lack of NRTI raises suspicion of failure of prior therapy vs contraindication with CKD and preference for nucleotide sparing regimen. Patient being tested for HIV genotyping, INSTI,  and HLA B701. HepC antibody negative, HepB surface antibody positive and surface antigen negative consistent with immunity.  Patient started on Preszcobix, Tivicay, and Edurant while here (8/8).  Plan to change regimen to Juluca (dolutegravir/rilpivirine) with Symstuza (Darunavir/cobistat/emtricitabine/tenofovir alafenamide), which will be similar to current regimen with addition of 2 NRTIs in emtricitabine and tenofivir alafenamide. [place] Medicaid not able to  cover medications in [place], but our ID pharmacy will be able to fill. He was also started on PCP prophylaxis with bactrim. Patient reports plans to move to [facility] in the near future, plan to have patient follow with out ID clinic.   #Pneumonia Patient presenting with R basilar infiltrate on CXR. He is currently receiving zosyn (8/5-). Patient has had episodes of SVT with heart rates to 175, primary team suspects atrial irritability s/p dialysis catheter vs pneumonia-related pericardial irritation. He has been afebrile x48 hours, WBC to 7.8, and BPs generally in 110-140s/70-90s with continued tachypnea but has adequate oxygenation, otherwise endorsing no respiratory symptoms.  #Acute on Chronic Renal Failure He has presumed stage IV/V CKD (Cr 3.9 in 11/2015, no other records) presenting with oliguric renal failure. Nephrology following. HIVAN is possible contributor given long-standing history of HIV medication non-compliance in an [place] American male. Suspect limited recovery of renal function if patient has HIVAN. Patient receiving HD through temporary R IJ catheter. He is being evaluated for permanent access   Principal Problem:   Acute renal failure (ARF) ([place]) Active Problems:   Hypertension   HIV disease ([place])   Asthma   Community acquired pneumonia of right lower lobe of lung ([place])   Abdominal pain   Diarrhea   Anemia   AIDS (acquired immune deficiency syndrome) ([place])   ESRD (end stage renal disease) ([facility])   Nausea vomiting and diarrhea   AKI (acute kidney injury) ([place])   . darbepoetin (ARANESP) injection - DIALYSIS  100 mcg Intravenous Q Wed-HD  . darunavir-cobicistat  1 tablet Oral Q breakfast  . dextromethorphan-guaiFENesin  1 tablet Oral BID  . dolutegravir  50 mg Oral Daily  . feeding supplement  1 Container Oral TID  BM  . heparin  5,000 Units Subcutaneous Q8H  . mouth rinse  15 mL Mouth Rinse BID  . multivitamin  1 tablet Oral QHS  . rilpivirine  25 mg Oral Q breakfast    . sevelamer carbonate  800 mg Oral TID WC  . sodium chloride flush  3 mL Intravenous Q12H  . sulfamethoxazole-trimethoprim  1 tablet Oral Daily    SUBJECTIVE: Patient refusing being weighed this AM. Receiving HD this AM.  Review of Systems  Reason unable to perform ROS: deferred, patient in HD.    Allergies  Allergen Reactions  . Tomato Other (See Comments)    Mouth sores    :Physical Exam  Vitals:   05/01/17 1315 05/01/17 1354 05/01/17 2007 05/02/17 0500  BP:  119/82 124/76 123/83  Pulse:   100 82  Resp:   18 18  Temp:   (!) 100.4 F (38 C) 98.7 F (37.1 C)  TempSrc: Oral  Oral Oral  SpO2:  98% 98% 99%  Weight:      Height:       Body mass index is 34.83 kg/m.   Lab Results Lab Results  Component Value Date   WBC 6.8 05/02/2017   HGB 7.8 (L) 05/02/2017   HCT 23.1 (L) 05/02/2017   MCV 84.6 05/02/2017   PLT 135 (L) 05/02/2017    Lab Results  Component Value Date   CREATININE 8.13 (H) 05/02/2017   BUN 34 (H) 05/02/2017   NA 136 05/02/2017   K 3.8 05/02/2017   CL 101 05/02/2017   CO2 26 05/02/2017    Lab Results  Component Value Date   ALT 14 (L) 04/28/2017   AST 33 04/28/2017   ALKPHOS 124 04/28/2017   BILITOT 0.6 04/28/2017     Microbiology: Recent Results (from the past 240 hour(s))  Culture, blood (Routine x 2)     Status: None (Preliminary result)   Collection Time: 04/28/17  9:43 AM  Result Value Ref Range Status   Specimen Description BLOOD LEFT ANTECUBITAL  Final   Special Requests   Final    BOTTLES DRAWN AEROBIC AND ANAEROBIC Blood Culture adequate volume   Culture NO GROWTH 3 DAYS  Final   Report Status PENDING  Incomplete  Culture, blood (Routine x 2)     Status: None (Preliminary result)   Collection Time: 04/28/17 10:15 AM  Result Value Ref Range Status   Specimen Description BLOOD RIGHT ANTECUBITAL  Final   Special Requests   Final    BOTTLES DRAWN AEROBIC AND ANAEROBIC Blood Culture adequate volume   Culture NO GROWTH 3  DAYS  Final   Report Status PENDING  Incomplete  Gastrointestinal Panel by PCR , Stool     Status: None   Collection Time: 04/28/17 10:40 AM  Result Value Ref Range Status   Campylobacter species NOT DETECTED NOT DETECTED Final   Plesimonas shigelloides NOT DETECTED NOT DETECTED Final   Salmonella species NOT DETECTED NOT DETECTED Final   Yersinia enterocolitica NOT DETECTED NOT DETECTED Final   Vibrio species NOT DETECTED NOT DETECTED Final   Vibrio cholerae NOT DETECTED NOT DETECTED Final   Enteroaggregative E coli (EAEC) NOT DETECTED NOT DETECTED Final   Enteropathogenic E coli (EPEC) NOT DETECTED NOT DETECTED Final   Enterotoxigenic E coli (ETEC) NOT DETECTED NOT DETECTED Final   Shiga like toxin producing E coli (STEC) NOT DETECTED NOT DETECTED Final   Shigella/Enteroinvasive E coli (EIEC) NOT DETECTED NOT DETECTED Final   Cryptosporidium NOT DETECTED  NOT DETECTED Final   Cyclospora cayetanensis NOT DETECTED NOT DETECTED Final   Entamoeba histolytica NOT DETECTED NOT DETECTED Final   Giardia lamblia NOT DETECTED NOT DETECTED Final   Adenovirus F40/41 NOT DETECTED NOT DETECTED Final   Astrovirus NOT DETECTED NOT DETECTED Final   Norovirus GI/GII NOT DETECTED NOT DETECTED Final   Rotavirus A NOT DETECTED NOT DETECTED Final   Sapovirus (I, II, IV, and V) NOT DETECTED NOT DETECTED Final  C difficile quick scan w PCR reflex     Status: None   Collection Time: 04/28/17 10:40 AM  Result Value Ref Range Status   C Diff antigen NEGATIVE NEGATIVE Final   C Diff toxin NEGATIVE NEGATIVE Final   C Diff interpretation No C. difficile detected.  Final  Urine culture     Status: Abnormal   Collection Time: 04/28/17  2:47 PM  Result Value Ref Range Status   Specimen Description URINE, RANDOM  Final   Special Requests NONE  Final   Culture <10,000 COLONIES/mL INSIGNIFICANT GROWTH (A)  Final   Report Status 04/29/2017 FINAL  Final  MRSA PCR Screening     Status: None   Collection Time:  04/30/17  6:51 PM  Result Value Ref Range Status   MRSA by PCR NEGATIVE NEGATIVE Final    Comment:        The GeneXpert MRSA Assay (FDA approved for NASAL specimens only), is one component of a comprehensive MRSA colonization surveillance program. It is not intended to diagnose MRSA infection nor to guide or monitor treatment for MRSA infections.    Will Rasheeda Mulvehill, MS4 05/02/2017, 9:03 AM

## 2017-05-01 NOTE — Plan of Care (Signed)
Problem: Safety: Goal: Ability to remain free from injury will improve Outcome: Progressing Fall risk bundle in place per policies. No falls, skin break down or other injuries this shift. Will continue to monitor.   Problem: Tissue Perfusion: Goal: Risk factors for ineffective tissue perfusion will decrease Outcome: Not Progressing Pt hgb 6.9. IM notified. Type&screen ordered and one unit of blood. Pt VS stable. No visible signs of bleeding.

## 2017-05-01 NOTE — Progress Notes (Signed)
Pt HR elevated at rest. Now in 160's. Pt also complained of anxiety. IM notified and stated he will come see the pt.

## 2017-05-01 NOTE — Procedures (Signed)
I have personally attended this patient's dialysis session.   Getting 1 unit transfusion Access is temp HD cath in R IJ - working fine ATB's to be done on 8/9 so could get perm access done fairly soon  Camille Balynthia Huston Stonehocker, MD [facility] [phone] Pager 05/01/2017, 11:58 AM
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
## 2017-05-02 ENCOUNTER — Telehealth: Payer: Self-pay | Admitting: *Deleted

## 2017-05-02 ENCOUNTER — Inpatient Hospital Stay (HOSPITAL_COMMUNITY): Payer: Medicaid Other

## 2017-05-02 ENCOUNTER — Other Ambulatory Visit: Payer: Self-pay | Admitting: Pharmacist Clinician (PhC)/ Clinical Pharmacy Specialist

## 2017-05-02 DIAGNOSIS — Z992 Dependence on renal dialysis: Secondary | ICD-10-CM

## 2017-05-02 DIAGNOSIS — N186 End stage renal disease: Secondary | ICD-10-CM

## 2017-05-02 DIAGNOSIS — N184 Chronic kidney disease, stage 4 (severe): Secondary | ICD-10-CM

## 2017-05-02 DIAGNOSIS — B20 Human immunodeficiency virus [HIV] disease: Secondary | ICD-10-CM

## 2017-05-02 DIAGNOSIS — I10 Essential (primary) hypertension: Secondary | ICD-10-CM

## 2017-05-02 LAB — TYPE AND SCREEN
ABO/RH(D): B POS
Antibody Screen: NEGATIVE
UNIT DIVISION: 0

## 2017-05-02 LAB — BPAM RBC
BLOOD PRODUCT EXPIRATION DATE: 201808292359
ISSUE DATE / TIME: 201808081032
UNIT TYPE AND RH: 7300

## 2017-05-02 LAB — RENAL FUNCTION PANEL
ALBUMIN: 1.8 g/dL — AB (ref 3.5–5.0)
Anion gap: 9 (ref 5–15)
BUN: 34 mg/dL — AB (ref 6–20)
CO2: 26 mmol/L (ref 22–32)
CREATININE: 8.13 mg/dL — AB (ref 0.61–1.24)
Calcium: 7 mg/dL — ABNORMAL LOW (ref 8.9–10.3)
Chloride: 101 mmol/L (ref 101–111)
GFR, EST AFRICAN AMERICAN: 9 mL/min — AB (ref >60)
GFR, EST NON AFRICAN AMERICAN: 8 mL/min — AB (ref >60)
Glucose, Bld: 102 mg/dL — ABNORMAL HIGH (ref 65–99)
PHOSPHORUS: 3.1 mg/dL (ref 2.5–4.6)
POTASSIUM: 3.8 mmol/L (ref 3.5–5.1)
Sodium: 136 mmol/L (ref 135–145)

## 2017-05-02 LAB — CBC
HCT: 23.1 % — ABNORMAL LOW (ref 39.0–52.0)
Hemoglobin: 7.8 g/dL — ABNORMAL LOW (ref 13.0–17.0)
MCH: 28.6 pg (ref 26.0–34.0)
MCHC: 33.8 g/dL (ref 30.0–36.0)
MCV: 84.6 fL (ref 78.0–100.0)
PLATELETS: 135 10*3/uL — AB (ref 150–400)
RBC: 2.73 MIL/uL — ABNORMAL LOW (ref 4.22–5.81)
RDW: 14.7 % (ref 11.5–15.5)
WBC: 6.8 10*3/uL (ref 4.0–10.5)

## 2017-05-02 MED ORDER — DOLUTEGRAVIR-RILPIVIRINE 50-25 MG PO TABS: 1.0000 | ORAL_TABLET | Freq: Every day | ORAL | 3 refills | Status: DC

## 2017-05-02 MED ORDER — AMBULATORY NON FORMULARY MEDICATION: 1.0000 | Freq: Every day | 3 refills | Status: DC

## 2017-05-02 MED ORDER — DILTIAZEM HCL 60 MG PO TABS
60.0000 mg | ORAL_TABLET | Freq: Four times a day (QID) | ORAL | Status: DC
  Administered 2017-05-02 – 2017-05-05 (×11): 60 mg via ORAL
  Filled 2017-05-02 (×11): qty 1

## 2017-05-02 MED ORDER — ACETAMINOPHEN 325 MG PO TABS
ORAL_TABLET | ORAL | Status: AC
  Filled 2017-05-02: qty 2

## 2017-05-02 NOTE — Progress Notes (Signed)
Day Nurse informed that another stool collection need to be obtained (per Microbiology lab).

## 2017-05-02 NOTE — Progress Notes (Signed)
Pt has refused to have his weight taken at this AM. He refused to get out of bed to stand on the scale.

## 2017-05-02 NOTE — Progress Notes (Signed)
[facility] Rounding note  SUBJECTIVE Brief HPI: 36 y/o M w/ uncontrolled HTN, HIV (current CD4 count 30), hx of CKD stage 4 (Cr 11/2015 3.9) now likely ESRD admitted 04/28/17 with 1 week of GI illness however had other uremic syx x1 month. Started HD 8/7 with good result. US with small echogenic kidneys. He has been non-compliant with his anti-retrovirals as he didn't feel like taking them.   Interval events: Seen during 3rd HD. Continues to feel much better since admission. 2nd HD session was uneventful. Had 1 unit PRB transfused then with appropriate response.  HR much better controlled but still remains on dilt drip. Looks like he will be transitioned to PO today.  I/O last 3 completed shifts: In: 306 [Blood:306] Out: 700 [Urine:300; Other:400] No intake/output data recorded.  OBJECTIVE Vitals: Blood pressure 123/83, pulse 82, temperature 98.7 F (37.1 C), temperature source Oral, resp. rate 18, height 6\' 1"  (1.854 m),, SpO2 99 %. Physical Exam   GEN: [name] laying comfortably in bed during HD. Right HD temp cath 04/29/17. Mental status clearer. Lots of questions about fistula.  CV: RRR. NO MGR.  PULM: CTA BL. Able to speak in complete sentences. Normal WOB on RA.  ABD: Soft. Not distended. +bs SKIN: warm, perfused. Not diaphoretic.  EXT: Minimal peripheral edema.   Recent Labs Lab 04/28/17 0949 04/29/17 0549 04/30/17 0408 05/01/17 0215 05/02/17 0322  WBC 18.5* 10.7* 8.7 7.8 6.8  NEUTROABS 13.9* 9.0*  --   --   --   HGB 9.2* 8.0* 7.7* 6.8* 7.8*  HCT 26.7* 23.6* 22.5* 20.1* 23.1*  MCV 85.6 85.2 83.0 84.5 84.6  PLT 150 127* 123* 134* 135*   Basic Metabolic Panel  Recent Labs Lab 04/28/17 0949 04/29/17 0549 04/30/17 0408 05/01/17 0215 05/02/17 0322  NA 128* 133* 136 138 136  K 4.4 4.4 3.8 3.7 3.8  CL 97* 102 102 103 101  CO2 15* 18* 21* 25 26  GLUCOSE 119* 106* 100* 92 102*  BUN 102* 101* 100* 67* 34*  CREATININE 15.14* 14.37* 14.64* 11.20* 8.13*  CALCIUM 8.5* 7.8* 7.5*   7.4* 7.0* 7.0*  PHOS  --  7.3* 7.9* 5.0* 3.1   Medications . darbepoetin (ARANESP) injection - DIALYSIS  100 mcg Intravenous Q Wed-HD  . darunavir-cobicistat  1 tablet Oral Q breakfast  . dextromethorphan-guaiFENesin  1 tablet Oral BID  . dolutegravir  50 mg Oral Daily  . feeding supplement  1 Container Oral TID BM  . heparin  5,000 Units Subcutaneous Q8H  . mouth rinse  15 mL Mouth Rinse BID  . multivitamin  1 tablet Oral QHS  . rilpivirine  25 mg Oral Q breakfast  . sevelamer carbonate  800 mg Oral TID WC  . sodium chloride flush  3 mL Intravenous Q12H  . sulfamethoxazole-trimethoprim  1 tablet Oral Daily   Continuous Infusions: . diltiazem (CARDIZEM) infusion 5 mg/hr (04/30/17 0149)  . piperacillin-tazobactam (ZOSYN)  IV Stopped (04/30/17 0417)  .  sodium bicarbonate  infusion 1000 mL 100 mL/hr at 04/29/17 2229   PRN Meds:.acetaminophen **OR** acetaminophen, albuterol, gi cocktail, lidocaine (PF), ondansetron **OR** ondansetron (ZOFRAN) IV, zolpidem  IMAGING:  CXR 04/29/17: Improving right basilar opacity consistent with improving pneumonia. Tip of R IJ catheter in expected location. No pneumothorax.   Background: This is a 36 y/o M with poorly controlled HTN and HIV here with acute on chronic renal failure in setting of acute GI illness and RLL PNA. Likely new ESRD. Has temp HD cath as  pt febrile with PNA however ordering vein mapping in anticipation of graft vs fistula placement.   ASSESSMENT & PLAN  1. Acute on Chronic Renal Failure, Likely new ESRD: Via R temp HD cath placed 8/6, first HD 8/7.  Patient s/p 2 HD sessions.  HD today.  Vein mapping hopefully today.  Starting CLIP process. -HD again today, will try to go TIW route following -Follow-up vein mapping -Consult VVS, d/w primary -Will need tunneled HD cath plus AVF -CLIP process will be hindered by need for emergency Medicaid   2. RLL Pneumonia:  Improving.  On Zosyn and Bactrim currently.  Remains afebrile.   -ID on board -Continue tx per recs -Can place tunneled HD cath once febrile illness resolved -Bcx NGTD  3. Advanced HIV Disease:  Several months of anti-retroviral non-compliance.  CD4 count now 30.  -Started on Tivicay and Prezcobix   4. Hyperphosphatemia:  Phosphorus 3.1 today while on Revenla.  PTH 247. (In range for CKD 5 150-300)  -Continue Renvela 800mg  TID WC  5. Normocytic anemia:  Hb appropriately 7.8 today after receiving 1 unit PRB yesterday with HD.  Had first aranesp 100 mcg 8/8  -Follow CBC daily  Noemi Chapel, DO Internal Medicine - PGY2 05/02/2017, 8:39 AM   Agree with above assessment and recommendations in the excellent note of Dr. Vincente Liberty. Using R IJ temp cath with HD #3 for pt today. Marked improvement in uremic symptoms. Needs vein mapping and VVS consultation.  CLIP process will be hindered by need for emergency Medicaid application  Camille Bal, MD [facility] [phone] Pager 05/02/2017, 1:02 PM

## 2017-05-02 NOTE — Progress Notes (Signed)
Dialysis treatment completed.  2083 mL ultrafiltrated.  1583 mL net fluid removal.  Patient status unchanged. Lung sounds diminished to ausculation in all fields. No edema. Cardiac: NSR.  Cleansed RIJ catheter with chlorhexidine.  Disconnected lines and flushed ports with saline per protocol.  Ports locked with heparin and capped per protocol.    Report given to bedside, RN Otelia SanteeFarrah.

## 2017-05-02 NOTE — Progress Notes (Signed)
  Subjective: Patient was seen and examined at bedside. He is to receive another dialysis session today and vein mappign for potential AV fistula placement.  He reports that he his feeling better and no longer in any pain. He denies any chest pain, difficulty breathing, nausea/vomitting, or weakness.   Objective: Vital signs in last 24 hours: Vitals:   05/01/17 1315 05/01/17 1354 05/01/17 2007 05/02/17 0500  BP:  119/82 124/76 123/83  Pulse:   100 82  Resp:   18 18  Temp:   (!) 100.4 F (38 C) 98.7 F (37.1 C)  TempSrc: Oral  Oral Oral  SpO2:  98% 98% 99%  Weight:      Height:       Weight change: 1.3 kg (2 lb 13.9 oz)  Intake/Output Summary (Last 24 hours) at 05/02/17 0955 Last data filed at 05/02/17 0700  Gross per 24 hour  Intake              306 ml  Output              700 ml  Net             -394 ml   BP 123/83 (BP Location: Right Arm)   Pulse 82   Temp 98.7 F (37.1 C) (Oral)   Resp 18   Ht 6\' 1"  (1.854 m)   Wt 120.9 kg (266 lb 8.6 oz) Comment: stood to scale   SpO2 99%   BMI 35.17 kg/m  General appearance: alert, cooperative and no distress Head: Normocephalic, without obvious abnormality, atraumatic Lungs: clear to auscultation bilaterally Heart: regular rate and rhythm, S1, S2 normal, no murmur, click, rub or gallop Skin: Widespread Xerosis present  Assessment/Plan: Acute on Chronic Renal Failure  Patient received third round of dialysis today. His creatinine improved to 8.13 from 11.2 and his acidosis has resolved. Per nephrology will receive  vein mapping today, for more permanent HD access. We are monitoring his BUN,Creatinine and electrolyte closely.  He continues Renvela and Aranesp per Nephrology.  Supraventricular Tachycardia  Patient has been in sinus rhythm for 24hr, will discontinue diltiazem drip and transition to oral diltiazem 60mg  Q6hr. If tachycardia is not controlled with this dose can increase to 90mg  Q6hr. Patient on telemetry  Community  Acquired Pneumonia of the Right Lower Lobe   Patient is symptomatically improving. Legionella antigen was negative. Patient to receive last dose of zosyn today to complete 5 day therapy duration. Will discontinue tomorrow.  HIV    Patient will continue triple antiretroviral therapy( Prezcobix,Tivicay and Edurant). Due to this patient's hiatus from anti-retrovirals we will monitor him closely for symptoms of immune reconstitution inflammatory syndrome.  He is on Bactrim for PCP prophylaxis.   Diarrhea   Per patient diarrhea has resolved. Most likely due to uremia. Ova and Parasite studies still pending.   This is a Psychologist, occupationalMedical Student Note.  The care of the patient was discussed with Dr.Wallace  and the assessment and plan formulated with their assistance.  Please see their attached note for official documentation of the daily encounter.   LOS: 4 days   Shantelle Alles, Junius FinnerKalyani L, Medical Student 05/02/2017, 9:55 AM

## 2017-05-02 NOTE — Care Management Note (Signed)
Case Management Note  Patient Details  Name: James Schwartz MRN: [id number] Date of Birth: 11-Jan-1981  Subjective/Objective:  Pt admitted with SOB, PNA - has hx of ESRD and HIV                  Action/Plan:  PTA from home, has wife.  Pt has been documented as non compliant with HIV medication regimen.  Pt has had HD initiated here thus far has tolerated 3 sessions.  Per nephrology - clipping process has began.  Pt is being followed by financial counseling for [place] medicaid (pt has [employer] [place]) as this is a requirement for  outpt HD placement.  CM will continue to follow for discharge needs - if pt discharges home will need set up at clinic and medication assistance   Expected Discharge Date:                  Expected Discharge Plan:     In-House Referral:  Clinical Social Work  Discharge planning Services  CM Consult  Post Acute Care Choice:    Choice offered to:     DME Arranged:    DME Agency:     HH Arranged:    HH Agency:     Status of Service:     If discussed at [employer] Meetings, dates discussed:    Additional Comments:  Cherylann ParrClaxton, Esabella Stockinger S, RN 05/02/2017, 9:21 AM

## 2017-05-02 NOTE — Procedures (Signed)
I have personally attended this patient's dialysis session.   Using R IJ tem cath HD #3 for pt today Marked improvement in uremic symptoms Needs vein mapping and VVS consultation now that ATB's to stop today CLIP process will be hindered by need for emergency Medicaid applicaiton   Camille Balynthia Quanesha Klimaszewski, MD [facility] [phone] Pager 05/02/2017, 1:01 PM

## 2017-05-02 NOTE — Consult Note (Addendum)
Requested by:  Dr. Eliott Nine (Nephrology)  Reason for consultation: New access   History of Present Illness   James Schwartz is a 36 y.o. ([birth year]) male with AIDS who presents for evaluation for permanent access and tunneled dialysis catheter placement.  The patient is right hand dominant.  The patient has not had previous access procedures.  Previous central venous cannulation procedures include: right internal jugular vein temporary dialysis catheter.  The patient has never had a PPM placed.  This patient has not be compliant with his anti-HIV regimen.  Past Medical History:  Diagnosis Date  . Asthma   . HIV (human immunodeficiency virus infection) (HCC)     Past Surgical History:  Procedure Laterality Date  . IR FLUORO GUIDE CV LINE RIGHT  04/29/2017  . IR US GUIDE VASC ACCESS RIGHT  04/29/2017    Social History   Social History  . Marital status: Single    Spouse name: N/A  . Number of children: N/A  . Years of education: N/A   Occupational History  . Not on file.   Social History Main Topics  . Smoking status: Never Smoker  . Smokeless tobacco: Never Used  . Alcohol use No  . Drug use: No  . Sexual activity: Not on file   Other Topics Concern  . Not on file   Social History Narrative  . No narrative on file    Family History: patient is unable to detail the medical history of his parents   Current Facility-Administered Medications  Medication Dose Route Frequency Provider Last Rate Last Dose  . acetaminophen (TYLENOL) tablet 650 mg  650 mg Oral Q6H PRN Gwynn Burly, DO   650 mg at 04/29/17 2228   Or  . acetaminophen (TYLENOL) suppository 650 mg  650 mg Rectal Q6H PRN Gwynn Burly, DO      . albuterol (PROVENTIL) (2.5 MG/3ML) 0.083% nebulizer solution 2.5 mg  2.5 mg Nebulization Q4H PRN Gwynn Burly, DO   2.5 mg at 04/28/17 2115  . Darbepoetin Alfa (ARANESP) injection 100 mcg  100 mcg Intravenous Q Wed-HD Molt, Bethany, DO   100 mcg at 05/01/17  1205  . darunavir-cobicistat (PREZCOBIX) 800-150 MG per tablet 1 tablet  1 tablet Oral Q breakfast Daiva Eves, Lisette Grinder, MD   1 tablet at 05/02/17 [phone]  . dextromethorphan-guaiFENesin (MUCINEX DM) 30-600 MG per 12 hr tablet 1 tablet  1 tablet Oral BID Darreld Mclean, MD   1 tablet at 05/02/17 0859  . diltiazem (CARDIZEM) tablet 60 mg  60 mg Oral Q6H Gwynn Burly, DO   60 mg at 05/02/17 1413  . dolutegravir (TIVICAY) tablet 50 mg  50 mg Oral Daily Daiva Eves, Lisette Grinder, MD   50 mg at 05/02/17 0859  . feeding supplement (BOOST / RESOURCE BREEZE) liquid 1 Container  1 Container Oral TID BM Levert Feinstein, MD   1 Container at 05/01/17 2000  . heparin injection 5,000 Units  5,000 Units Subcutaneous Q8H Gwynn Burly, DO   5,000 Units at 05/02/17 1413  . lidocaine (PF) (XYLOCAINE) 1 % injection    PRN Oley Balm, MD   10 mL at 04/29/17 1647  . MEDLINE mouth rinse  15 mL Mouth Rinse BID Levert Feinstein, MD   15 mL at 05/02/17 0900  . multivitamin (RENA-VIT) tablet 1 tablet  1 tablet Oral QHS Camille Bal, MD   1 tablet at 05/01/17 2147  . ondansetron (ZOFRAN) tablet 4 mg  4 mg Oral Q6H PRN  Gwynn Burly, DO   4 mg at 04/30/17 2202   Or  . ondansetron [facility]) injection 4 mg  4 mg Intravenous Q6H PRN Gwynn Burly, DO   4 mg at 04/29/17 1400  . piperacillin-tazobactam (ZOSYN) IVPB 3.375 g  3.375 g Intravenous Q12H Margart Sickles, RPH   Stopped at 05/02/17 0157  . rilpivirine (EDURANT) tablet 25 mg  25 mg Oral Q breakfast Daiva Eves, Lisette Grinder, MD   25 mg at 05/02/17 0859  . sevelamer carbonate (RENVELA) tablet 800 mg  800 mg Oral TID WC Molt, Bethany, DO   800 mg at 05/02/17 0859  . sodium chloride flush (NS) 0.9 % injection 3 mL  3 mL Intravenous Q12H Gwynn Burly, DO   3 mL at 05/02/17 0900  . sulfamethoxazole-trimethoprim (BACTRIM,SEPTRA) 400-80 MG per tablet 1 tablet  1 tablet Oral Daily Daiva Eves, Lisette Grinder, MD   1 tablet at 05/01/17 1725  . zolpidem (AMBIEN) tablet 5 mg  5  mg Oral QHS PRN,MR X 1 Gwynn Burly, DO   5 mg at 05/01/17 2148    Allergies  Allergen Reactions  . Tomato Other (See Comments)    Mouth sores    REVIEW OF SYSTEMS (negative unless checked):   Cardiac:  []  Chest pain or chest pressure? [x]  Shortness of breath upon activity? [x]  Shortness of breath when lying flat? []  Irregular heart rhythm?  Vascular:  []  Pain in calf, thigh, or hip brought on by walking? []  Pain in feet at night that wakes you up from your sleep? []  Blood clot in your veins? []  Leg swelling?  Pulmonary:  []  Oxygen at home? [x]  Productive cough? [x]  Wheezing?  Neurologic:  []  Sudden weakness in arms or legs? []  Sudden numbness in arms or legs? []  Sudden onset of difficult speaking or slurred speech? []  Temporary loss of vision in one eye? []  Problems with dizziness?  Gastrointestinal:  []  Blood in stool? []  Vomited blood?  Genitourinary:  []  Burning when urinating? []  Blood in urine?  Psychiatric:  []  Major depression  Hematologic:  []  Bleeding problems? []  Problems with blood clotting?  Dermatologic:  []  Rashes or ulcers?  Constitutional:  []  Fever or chills?  Ear/Nose/Throat:  []  Change in hearing? []  Nose bleeds? []  Sore throat?  Musculoskeletal:  []  Back pain? []  Joint pain? []  Muscle pain?   Physical Examination     Vitals:   05/02/17 1200 05/02/17 1230 05/02/17 1245 05/02/17 1248  BP: 128/76 125/77 126/81 (!) 125/35  Pulse: 81 78 82 72  Resp:   (!) 27   Temp:   98.2 F (36.8 C)   TempSrc:      SpO2:      Weight:   260 lb 9.3 oz (118.2 kg)   Height:       Body mass index is 34.38 kg/m.  General Alert, O x 3, Obese, NAD  Head Purdy/AT,    Ear/Nose/ Throat Hearing grossly intact, nares without erythema or drainage, oropharynx without Erythema or Exudate, Mallampati score: 3,   Eyes PERRLA, EOMI,    Neck Supple, mid-line trachea,    Pulmonary Sym exp, good B air movt, upper airway sounds  Cardiac RRR, Nl  S1, S2, no Murmurs, No rubs, No S3,S4  Vascular Vessel Right Left  Radial Palpable Palpable  Brachial Palpable Palpable  Carotid Palpable, No Bruit Palpable, No Bruit  Aorta Not palpable N/A  Femoral Palpable Palpable  Popliteal Not palpable Not palpable  PT Palpable Palpable  DP  Palpable Palpable    Gastro- intestinal soft, non-distended, non-tender to palpation, No guarding or rebound, no HSM, no masses, no CVAT B, No palpable prominent aortic pulse due to pannus,     Musculo- skeletal M/S 5/5 throughout  , Extremities without ischemic changes  , No edema present, No visible varicosities , No Lipodermatosclerosis present  Neurologic Cranial nerves 2-12 intact , Pain and light touch intact in extremities , Motor exam as listed above  Psychiatric Judgement intact, Mood & affect appropriate for pt's clinical situation  Dermatologic See M/S exam for extremity exam, No rashes otherwise noted  Lymphatic  Palpable lymph nodes: None     Non-invasive Vascular Imaging   BUE Vein Mapping pending   Laboratory   CBC CBC Latest Ref Rng & Units 05/02/2017 05/01/2017 04/30/2017  WBC 4.0 - 10.5 K/uL 6.8 7.8 8.7  Hemoglobin 13.0 - 17.0 g/dL 7.8(L) 6.8(LL) 7.7(L)  Hematocrit 39.0 - 52.0 % 23.1(L) 20.1(L) 22.5(L)  Platelets 150 - 400 K/uL 135(L) 134(L) 123(L)    BMP BMP Latest Ref Rng & Units 05/02/2017 05/01/2017 04/30/2017  Glucose 65 - 99 mg/dL 161(W) 92 960(A)  BUN 6 - 20 mg/dL 54(U) 98(J) 191(Y)  Creatinine 0.61 - 1.24 mg/dL 7.82(N) 56.21(H) 08.65(H)  Sodium 135 - 145 mmol/L 136 138 136  Potassium 3.5 - 5.1 mmol/L 3.8 3.7 3.8  Chloride 101 - 111 mmol/L 101 103 102  CO2 22 - 32 mmol/L 26 25 21(L)  Calcium 8.9 - 10.3 mg/dL 7.0(L) 7.0(L) 7.5(L)    Coagulation Lab Results  Component Value Date   INR 1.16 04/28/2017   No results found for: PTT  Lipids No results found for: CHOL, TRIG, HDL, CHOLHDL, VLDL, LDLCALC, LDLDIRECT  HIV viral count: 258,000   Radiology     04/29/17  CXR: Improving right basilar opacity consistent with improving pneumonia. Interval placement of right internal jugular catheter with distal tip in expected position of right atrium. No pneumothorax is noted.   Medical Decision Making   James Schwartz is a 36 y.o. male who presents with end stage renal disease requiring hemodialysis, AIDS   Will need B arm vein mapping, obviously prefer to place any permanent access into L arm, but will use best vein available.  Will schedule pt for L arm AVF vs AVG placement, tunneled dialysis exchange vs placement for Monday, 05/06/17.  The patient is aware that the risks of access surgery include but are not limited to: bleeding, infection, steal syndrome, nerve damage, ischemic monomelic neuropathy, failure of access to mature, complications related to venous hypertension, and possible need for additional access procedures in the future. The patient is aware the risks of tunneled dialysis catheter placement include but are not limited to: bleeding, infection, central venous injury, pneumothorax, possible venous stenosis, possible malpositioning in the venous system, and possible infections related to long-term catheter presence.  The patient was aware of these risks and agreed to proceed.   Leonides Sake, MD, FACS [facility] Office: [phone] Pager: [phone]  05/02/2017, 2:16 PM   Addendum  Right Cephalic  Segment Diameter Depth Comment  1. Axilla 2.5 mm 13.6 mm   2. Mid upper arm 2.7 mm 6.9 mm   3. Above AC 3.1 mm 4.5 mm   4. In AC 4.6 mm 4.1 mm   5. Below AC 4.5 mm 4.3 mm Branch  6. Mid forearm 2.9 mm 7 mm Branch  7. Wrist 2.2 mm 4.9 mm    Right Basilic  Segment  Diameter Depth Comment  2. Mid upper arm 3.6 mm 14.2 mm   3. Above AC 2.7 mm 14 mm   4. In AC 2.6 mm 6.9 mm  branch  5. Below AC 2.5 mm 2.2 mm  branch  6. Mid forearm 3 mm 5.9 mm   7. Wrist 2.4 mm 2.9 mm    Left  Cephalic  Segment Diameter Depth Comment  1. Axilla 3.7 mm 14.5 mm   2. Mid upper arm 3.6 mm 12 mm branch  3. Above AC 1.6 mm 7.9 mm   4. In AC 1.7 mm 2.4 mm  branch  5. Below AC 1.2 mm 2.7 mm   6. Mid forearm 1.3 mm 2.6 mm   7. Wrist mm mm    Small segment of superficial vein thrombosis noted in the left cephalic vein at the axilla.  - R cephalic vein much better, so change to R RC vs BC AVF for Monday   Leonides SakeBrian Chen, MD, [facility] [place] Office: [phone] Pager: [phone]  05/02/2017, 4:44 PM

## 2017-05-02 NOTE — Progress Notes (Signed)
Patient arrived to unit by bed.  Reviewed treatment plan and this RN agrees with plan.  Report received from bedside RN, Ceaser.  Consent verified.  Patient A & o X 4.   Lung sounds diminished to ausculation in all fields. No edema. Cardiac:  NSR.  Removed caps and cleansed RIJ catheter with chlorhedxidine.  Aspirated ports of heparin and flushed them with saline per protocol.  Connected and secured lines, initiated treatment at 0943.  UF Goal of 2500 mL and net fluid removal 2 L.  Will continue to monitor.

## 2017-05-02 NOTE — Progress Notes (Signed)
Bilateral Upper Extremity Vein Map  Right Cephalic  Segment Diameter Depth Comment  1. Axilla 2.5 mm 13.6 mm   2. Mid upper arm 2.7 mm 6.9 mm   3. Above AC 3.1 mm 4.5 mm   4. In AC 4.6 mm 4.1 mm   5. Below AC 4.5 mm 4.3 mm Branch  6. Mid forearm 2.9 mm 7 mm Branch  7. Wrist 2.2 mm 4.9 mm    Right Basilic  Segment Diameter Depth Comment  2. Mid upper arm 3.6 mm 14.2 mm   3. Above AC 2.7 mm 14 mm   4. In AC 2.6 mm 6.9 mm  branch  5. Below AC 2.5 mm 2.2 mm  branch  6. Mid forearm 3 mm 5.9 mm   7. Wrist 2.4 mm 2.9 mm    Left Cephalic  Segment Diameter Depth Comment  1. Axilla 3.7 mm 14.5 mm   2. Mid upper arm 3.6 mm 12 mm branch  3. Above AC 1.6 mm 7.9 mm   4. In AC 1.7 mm 2.4 mm  branch  5. Below AC 1.2 mm 2.7 mm   6. Mid forearm 1.3 mm 2.6 mm   7. Wrist mm mm    Small segment of superficial vein thrombosis noted in the left cephalic vein at the axilla.  Levin Baconlaire Cara Aguino- RDMS, RVT 4:05 PM  05/02/2017

## 2017-05-02 NOTE — Telephone Encounter (Signed)
error 

## 2017-05-02 NOTE — Progress Notes (Addendum)
Antiviral Management:  Pt has been approved for 3 months of Juluca and 1 month of Symtuza through patient assistance. Both meds have been delivered to the nurse [facility]) today so they can be sent home at discharge. Counseled him for a good 20 minutes on how to take the meds and he needs to be compliance. Told him to make it a routine to take both at dinner with food since he would be done with his HD on the HD days. This to avoid any issue with drug removal with HD.   Discharge HIV antivirals = Symtuza + Narda BondsJuluca  Minh Pham, PharmD, BCPS, AAHIVP, CPP Infectious Disease Pharmacist Pager: [phone] 05/02/2017 3:50 PM

## 2017-05-02 NOTE — Plan of Care (Signed)
Problem: Fluid Volume: Goal: Ability to maintain a balanced intake and output will improve Outcome: Progressing Pt educated on fluid restriction.

## 2017-05-03 DIAGNOSIS — J181 Lobar pneumonia, unspecified organism: Secondary | ICD-10-CM

## 2017-05-03 LAB — RENAL FUNCTION PANEL
Albumin: 1.9 g/dL — ABNORMAL LOW (ref 3.5–5.0)
Anion gap: 9 (ref 5–15)
BUN: 18 mg/dL (ref 6–20)
CALCIUM: 7.3 mg/dL — AB (ref 8.9–10.3)
CHLORIDE: 101 mmol/L (ref 101–111)
CO2: 26 mmol/L (ref 22–32)
CREATININE: 6.64 mg/dL — AB (ref 0.61–1.24)
GFR, EST AFRICAN AMERICAN: 11 mL/min — AB (ref >60)
GFR, EST NON AFRICAN AMERICAN: 10 mL/min — AB (ref >60)
Glucose, Bld: 93 mg/dL (ref 65–99)
Phosphorus: 3.6 mg/dL (ref 2.5–4.6)
Potassium: 3.5 mmol/L (ref 3.5–5.1)
SODIUM: 136 mmol/L (ref 135–145)

## 2017-05-03 LAB — CULTURE, BLOOD (ROUTINE X 2)
CULTURE: NO GROWTH
Culture: NO GROWTH
SPECIAL REQUESTS: ADEQUATE
Special Requests: ADEQUATE

## 2017-05-03 LAB — CBC
HCT: 24.8 % — ABNORMAL LOW (ref 39.0–52.0)
Hemoglobin: 8.2 g/dL — ABNORMAL LOW (ref 13.0–17.0)
MCH: 28.3 pg (ref 26.0–34.0)
MCHC: 33.1 g/dL (ref 30.0–36.0)
MCV: 85.5 fL (ref 78.0–100.0)
PLATELETS: 164 10*3/uL (ref 150–400)
RBC: 2.9 MIL/uL — AB (ref 4.22–5.81)
RDW: 14.8 % (ref 11.5–15.5)
WBC: 6.3 10*3/uL (ref 4.0–10.5)

## 2017-05-03 NOTE — Progress Notes (Signed)
   Preoperative Note   Procedure: RIJV TDC exchange, R RC vs, BC AVF  Date: 05/06/17  Preoperative diagnosis: ESRD  Consent: to be obtained  Laboratory:  CBC:    Component Value Date/Time   WBC 6.3 05/03/2017 0258   RBC 2.90 (L) 05/03/2017 0258   HGB 8.2 (L) 05/03/2017 0258   HCT 24.8 (L) 05/03/2017 0258   PLT 164 05/03/2017 0258   MCV 85.5 05/03/2017 0258   MCH 28.3 05/03/2017 0258   MCHC 33.1 05/03/2017 0258   RDW 14.8 05/03/2017 0258   LYMPHSABS 0.8 04/29/2017 0549   MONOABS 0.8 04/29/2017 0549   EOSABS 0.1 04/29/2017 0549   BASOSABS 0.0 04/29/2017 0549    BMP:    Component Value Date/Time   NA 136 05/03/2017 0258   K 3.5 05/03/2017 0258   CL 101 05/03/2017 0258   CO2 26 05/03/2017 0258   GLUCOSE 93 05/03/2017 0258   BUN 18 05/03/2017 0258   CREATININE 6.64 (H) 05/03/2017 0258   CALCIUM 7.3 (L) 05/03/2017 0258   CALCIUM 7.4 (L) 04/30/2017 0408   GFRNONAA 10 (L) 05/03/2017 0258   GFRAA 11 (L) 05/03/2017 0258    Coagulation: Lab Results  Component Value Date   INR 1.16 04/28/2017   No results found for: PTT   Leonides SakeBrian Chen, MD [facility] [place] Office: [phone] Pager: [phone]  05/03/2017, 10:39 AM

## 2017-05-03 NOTE — Progress Notes (Signed)
Medicine attending: I examined this patient today and I concur with the evaluation and management plan as recorded by resident physician Dr. Gwynn BurlyAndrew Wallace and acting intern Ms.Kalyani Avva.  He is feeling much better.  He expresses appreciation for current medical care.  He really wants to go home.  He had a hard time understanding the logistics and time commitments associated with placing a AV fistula and arranging for a suitable outpatient dialysis center after discharge. Hemoglobin stable at 8.2 following transfusion given the other day.  Platelet count now normal. Maximum temperature 99.2 off all antibiotics. Impression: 1.  Progression to end-stage renal disease now dialysis dependent 2.  HIV disease noncompliant with medications.  Now back on antiretrovirals and PCP prophylaxis. 3.  Right middle lobe pneumonia.  Clinical response to 5 days of parenteral Zosyn. 4.  Acute supraventricular tachyarrhythmia.  Medically converted with Cardizem.  Initial IV.  Now p.o.

## 2017-05-03 NOTE — Progress Notes (Signed)
Date of Admission:  04/28/2017     Recommendations  - Continue Prezcobix, Tivicay, and Edurant (8/8 - ).  - Plan to change regimen to Symstuza and continue Edurant on discharge, with discontinuation of Prezcobix and Tivicay. Patient should take these medications with chewable meals and should avoid antacids/acid suppression. - Continue SS Bactrim for PCP prophylaxis  - Continue treating CAP as per primary team - Hold off on starting MAC prophylaxis - Follow up HIV genotyping, INSTI testing, and HLA B701 - Patient will follow with our ID clinic in week after next. We will arrange transportation and scheduling. Refer to Dr. Derek Mound note for additional information - Blood cultures negative for over 72 hours. Patient is cleared for tunneled HD cath and AVF procedure  Assessment This 36 yo male with a history of HIV, CKD stage IV/V, and HTN presents with pneumonia, acute on chronic renal failure, and possible GI infection.   #HIV/AIDS:  Patient has CD4 of 30, helper T cell 2%and HIV viral load to 258,000. He has a long-standing history of antiretroviral noncompliance, which he attributes to medication side effects and ambivalence about being treated for the disease. Patient had been following with Dr. Tomie China in [facility] in [place], [place]. Per chart, patient had prescription filled for Prezista, Norvir, Tivicay, and Edurant as of 03/18/17, none of which he has been taking. Lack of NRTI raises suspicion of failure of prior therapy vs contraindication with CKD and preference for nucleotide sparing regimen. Patient being tested for HIV genotyping, INSTI,  and HLA B701. HepC antibody negative, HepB surface antibody positive and surface antigen negative consistent with immunity.  Plan to change regimen to Symstuza and continue Edurant on discharge, with discontinuation of Prezcobix and Tivicay. He was also started on PCP prophylaxis with bactrim. Patient reports plans to move to [facility]  in the near future, plan to have patient follow with out ID clinic.   #Pneumonia Patient presenting with R basilar infiltrate on CXR. Patient has completed course of Zosyn (8/5 - 8/10) and has been stable and without respiratory symptoms.  #Acute on Chronic Renal Failure He has presumed stage IV/V CKD (Cr 3.9 in 11/2015, no other records) presenting with oliguric renal failure. Nephrology following. HIVAN is possible contributor given long-standing history of HIV medication non-compliance in an [place] American male. Suspect limited recovery of renal function if patient has HIVAN. Blood cultures have been negative for over 72 hours, patient is cleared for HD cath and AVF procedure from ID standpoint.  Principal Problem:   Acute renal failure (ARF) (HCC) Active Problems:   Hypertension   HIV disease ([place])   Asthma   Community acquired pneumonia of right lower lobe of lung ([place])   Abdominal pain   Diarrhea   Anemia   AIDS (acquired immune deficiency syndrome) ([place])   ESRD (end stage renal disease) (HCC)   Nausea vomiting and diarrhea   AKI (acute kidney injury) ([place])   Dialysis patient ([place])   . darbepoetin (ARANESP) injection - DIALYSIS  100 mcg Intravenous Q Wed-HD  . darunavir-cobicistat  1 tablet Oral Q breakfast  . dextromethorphan-guaiFENesin  1 tablet Oral BID  . diltiazem  60 mg Oral Q6H  . dolutegravir  50 mg Oral Daily  . feeding supplement  1 Container Oral TID BM  . heparin  5,000 Units Subcutaneous Q8H  . mouth rinse  15 mL Mouth Rinse BID  . multivitamin  1 tablet Oral QHS  . rilpivirine  25 mg Oral  Q breakfast  . sevelamer carbonate  800 mg Oral TID WC  . sodium chloride flush  3 mL Intravenous Q12H  . sulfamethoxazole-trimethoprim  1 tablet Oral Daily    SUBJECTIVE: Patient reports feeling better today. Denies chest pain, shortness of breath, abdominal pain, nausea/vomiting.   Review of Systems: Review of Systems  All other systems reviewed and are  negative.   Allergies  Allergen Reactions  . Tomato Other (See Comments)    Mouth sores    OBJECTIVE: Vitals:   05/02/17 1807 05/02/17 2052 05/03/17 0019 05/03/17 0536  BP: 126/87 127/87 124/88 131/84  Pulse: 98 98 83 89  Resp:  _0 Temp: 98 F (36.7 C) 99 F (37.2 C) 98.7 F (37.1 C) 98.5 F (36.9 C)  TempSrc: Oral Oral Oral Oral  SpO2: 97% 99% 99% 97%  Weight:    118.4 kg (261 lb 0.4 oz)  Height:       Body mass index is 34.44 kg/m.  Physical Exam  Constitutional: He is oriented to person, place, and time and well-developed, well-nourished, and in no distress.  HENT:  Head: Normocephalic and atraumatic.  Eyes: Conjunctivae are normal. No scleral icterus.  Neck: Neck supple.  Cardiovascular: Normal rate, regular rhythm and normal heart sounds.   No murmur heard. Pulmonary/Chest: Effort normal and breath sounds normal. No respiratory distress.  Abdominal: Soft. Bowel sounds are normal. He exhibits no distension. There is no tenderness.  Musculoskeletal: He exhibits no edema or tenderness.  Neurological: He is alert and oriented to person, place, and time.  Skin: Skin is warm and dry. No rash noted. No erythema.  Psychiatric: Mood and affect normal.    Lab Results Lab Results  Component Value Date   WBC 6.3 05/03/2017   HGB 8.2 (L) 05/03/2017   HCT 24.8 (L) 05/03/2017   MCV 85.5 05/03/2017   PLT 164 05/03/2017    Lab Results  Component Value Date   CREATININE 6.64 (H) 05/03/2017   BUN 18 05/03/2017   NA 136 05/03/2017   K 3.5 05/03/2017   CL 101 05/03/2017   CO2 26 05/03/2017    Lab Results  Component Value Date   ALT 14 (L) 04/28/2017   AST 33 04/28/2017   ALKPHOS 124 04/28/2017   BILITOT 0.6 04/28/2017     Microbiology: Recent Results (from the past 240 hour(s))  Culture, blood (Routine x 2)     Status: None (Preliminary result)   Collection Time: 04/28/17  9:43 AM  Result Value Ref Range Status   Specimen Description BLOOD LEFT  ANTECUBITAL  Final   Special Requests   Final    BOTTLES DRAWN AEROBIC AND ANAEROBIC Blood Culture adequate volume   Culture NO GROWTH 4 DAYS  Final   Report Status PENDING  Incomplete  Culture, blood (Routine x 2)     Status: None (Preliminary result)   Collection Time: 04/28/17 10:15 AM  Result Value Ref Range Status   Specimen Description BLOOD RIGHT ANTECUBITAL  Final   Special Requests   Final    BOTTLES DRAWN AEROBIC AND ANAEROBIC Blood Culture adequate volume   Culture NO GROWTH 4 DAYS  Final   Report Status PENDING  Incomplete  Gastrointestinal Panel by PCR , Stool     Status: None   Collection Time: 04/28/17 10:40 AM  Result Value Ref Range Status   Campylobacter species NOT DETECTED NOT DETECTED Final   Plesimonas shigelloides NOT DETECTED NOT DETECTED Final   Salmonella species NOT  DETECTED NOT DETECTED Final   Yersinia enterocolitica NOT DETECTED NOT DETECTED Final   Vibrio species NOT DETECTED NOT DETECTED Final   Vibrio cholerae NOT DETECTED NOT DETECTED Final   Enteroaggregative E coli (EAEC) NOT DETECTED NOT DETECTED Final   Enteropathogenic E coli (EPEC) NOT DETECTED NOT DETECTED Final   Enterotoxigenic E coli (ETEC) NOT DETECTED NOT DETECTED Final   Shiga like toxin producing E coli (STEC) NOT DETECTED NOT DETECTED Final   Shigella/Enteroinvasive E coli (EIEC) NOT DETECTED NOT DETECTED Final   Cryptosporidium NOT DETECTED NOT DETECTED Final   Cyclospora cayetanensis NOT DETECTED NOT DETECTED Final   Entamoeba histolytica NOT DETECTED NOT DETECTED Final   Giardia lamblia NOT DETECTED NOT DETECTED Final   Adenovirus F40/41 NOT DETECTED NOT DETECTED Final   Astrovirus NOT DETECTED NOT DETECTED Final   Norovirus GI/GII NOT DETECTED NOT DETECTED Final   Rotavirus A NOT DETECTED NOT DETECTED Final   Sapovirus (I, II, IV, and V) NOT DETECTED NOT DETECTED Final  C difficile quick scan w PCR reflex     Status: None   Collection Time: 04/28/17 10:40 AM  Result Value Ref  Range Status   C Diff antigen NEGATIVE NEGATIVE Final   C Diff toxin NEGATIVE NEGATIVE Final   C Diff interpretation No C. difficile detected.  Final  Urine culture     Status: Abnormal   Collection Time: 04/28/17  2:47 PM  Result Value Ref Range Status   Specimen Description URINE, RANDOM  Final   Special Requests NONE  Final   Culture <10,000 COLONIES/mL INSIGNIFICANT GROWTH (A)  Final   Report Status 04/29/2017 FINAL  Final  MRSA PCR Screening     Status: None   Collection Time: 04/30/17  6:51 PM  Result Value Ref Range Status   MRSA by PCR NEGATIVE NEGATIVE Final    Comment:        The GeneXpert MRSA Assay (FDA approved for NASAL specimens only), is one component of a comprehensive MRSA colonization surveillance program. It is not intended to diagnose MRSA infection nor to guide or monitor treatment for MRSA infections.     Will Nolin Grell, MS4 05/03/2017, 8:17 AM

## 2017-05-03 NOTE — Progress Notes (Addendum)
Subjective:  "do you have some good news.when can I go home?"   Antibiotics:  Anti-infectives    Start     Dose/Rate Route Frequency Ordered Stop   05/01/17 0800  darunavir-cobicistat (PREZCOBIX) 800-150 MG per tablet 1 tablet     1 tablet Oral Daily with breakfast 04/30/17 1606     05/01/17 0800  dolutegravir (TIVICAY) tablet 50 mg     50 mg Oral Daily 04/30/17 1606     05/01/17 0800  rilpivirine (EDURANT) tablet 25 mg     25 mg Oral Daily with breakfast 04/30/17 1606     04/30/17 1730  sulfamethoxazole-trimethoprim (BACTRIM,SEPTRA) 400-80 MG per tablet 1 tablet     1 tablet Oral Daily 04/30/17 1634     04/30/17 1000  azithromycin (ZITHROMAX) tablet 250 mg  Status:  Discontinued     250 mg Oral Daily 04/29/17 1121 05/01/17 0900   04/29/17 1130  azithromycin (ZITHROMAX) tablet 500 mg     500 mg Oral Daily 04/29/17 1121 04/29/17 1217   04/28/17 2200  piperacillin-tazobactam (ZOSYN) IVPB 3.375 g     3.375 g 12.5 mL/hr over 240 Minutes Intravenous Every 12 hours 04/28/17 1100     04/28/17 1100  vancomycin (VANCOCIN) 2,000 mg in sodium chloride 0.9 % 500 mL IVPB     2,000 mg 250 mL/hr over 120 Minutes Intravenous  Once 04/28/17 1012 04/28/17 1411   04/28/17 1030  piperacillin-tazobactam (ZOSYN) IVPB 3.375 g     3.375 g 100 mL/hr over 30 Minutes Intravenous  Once 04/28/17 1012 04/28/17 1133      Medications: Scheduled Meds: . darbepoetin (ARANESP) injection - DIALYSIS  100 mcg Intravenous Q Wed-HD  . darunavir-cobicistat  1 tablet Oral Q breakfast  . dextromethorphan-guaiFENesin  1 tablet Oral BID  . diltiazem  60 mg Oral Q6H  . dolutegravir  50 mg Oral Daily  . feeding supplement  1 Container Oral TID BM  . heparin  5,000 Units Subcutaneous Q8H  . mouth rinse  15 mL Mouth Rinse BID  . multivitamin  1 tablet Oral QHS  . rilpivirine  25 mg Oral Q breakfast  . sevelamer carbonate  800 mg Oral TID WC  . sodium chloride flush  3 mL Intravenous Q12H  .  sulfamethoxazole-trimethoprim  1 tablet Oral Daily   Continuous Infusions: . piperacillin-tazobactam (ZOSYN)  IV 3.375 g (05/03/17 1009)   PRN Meds:.acetaminophen **OR** acetaminophen, albuterol, lidocaine (PF), ondansetron **OR** ondansetron (ZOFRAN) IV, zolpidem    Objective: Weight change: -2 lb 6.8 oz (-1.1 kg)  Intake/Output Summary (Last 24 hours) at 05/03/17 1023 Last data filed at 05/03/17 0544  Gross per 24 hour  Intake                3 ml  Output             1683 ml  Net            -1680 ml   Blood pressure 131/84, pulse 89, temperature 98.5 F (36.9 C), temperature source Oral, resp. rate 18, height 6\' 1"  (1.854 m), weight 261 lb 0.4 oz (118.4 kg), SpO2 97 %. Temp:  [98 F (36.7 C)-99 F (37.2 C)] 98.5 F (36.9 C) (08/10 0536) Pulse Rate:  [72-98] 89 (08/10 0536) Resp:  [18-27] 18 (08/10 0536) BP: (124-131)/(35-88) 131/84 (08/10 0536) SpO2:  [97 %-99 %] 97 % (08/10 0536) Weight:  [260 lb 9.3 oz (118.2 kg)-261 lb 0.4 oz (118.4 kg)] 261  lb 0.4 oz (118.4 kg) (08/10 0536)  Physical Exam: General: Alert and awake, oriented x3, not in any acute distress. HEENT: anicteric sclera,  CVS regular rate, normal r,  no murmur rubs or gallops Chest:wheezing, resp distress Abdomen: soft nondistended, normal bowel sounds,  Skin: diffuse rash on abdomen, chest (not new) Lymph: no new lymphadenopathy Neuro: nonfocal  CBC:  CBC Latest Ref Rng & Units 05/03/2017 05/02/2017 05/01/2017  WBC 4.0 - 10.5 K/uL 6.3 6.8 7.8  Hemoglobin 13.0 - 17.0 g/dL 8.2(L) 7.8(L) 6.8(LL)  Hematocrit 39.0 - 52.0 % 24.8(L) 23.1(L) 20.1(L)  Platelets 150 - 400 K/uL 164 135(L) 134(L)      BMET  Recent Labs  05/02/17 0322 05/03/17 0258  NA 136 136  K 3.8 3.5  CL 101 101  CO2 26 26  GLUCOSE 102* 93  BUN 34* 18  CREATININE 8.13* 6.64*  CALCIUM 7.0* 7.3*     Liver Panel   Recent Labs  05/02/17 0322 05/03/17 0258  ALBUMIN 1.8* 1.9*       Sedimentation Rate No results for  input(s): ESRSEDRATE in the last 72 hours. C-Reactive Protein No results for input(s): CRP in the last 72 hours.  Micro Results: Recent Results (from the past 720 hour(s))  Culture, blood (Routine x 2)     Status: None (Preliminary result)   Collection Time: 04/28/17  9:43 AM  Result Value Ref Range Status   Specimen Description BLOOD LEFT ANTECUBITAL  Final   Special Requests   Final    BOTTLES DRAWN AEROBIC AND ANAEROBIC Blood Culture adequate volume   Culture NO GROWTH 4 DAYS  Final   Report Status PENDING  Incomplete  Culture, blood (Routine x 2)     Status: None (Preliminary result)   Collection Time: 04/28/17 10:15 AM  Result Value Ref Range Status   Specimen Description BLOOD RIGHT ANTECUBITAL  Final   Special Requests   Final    BOTTLES DRAWN AEROBIC AND ANAEROBIC Blood Culture adequate volume   Culture NO GROWTH 4 DAYS  Final   Report Status PENDING  Incomplete  Gastrointestinal Panel by PCR , Stool     Status: None   Collection Time: 04/28/17 10:40 AM  Result Value Ref Range Status   Campylobacter species NOT DETECTED NOT DETECTED Final   Plesimonas shigelloides NOT DETECTED NOT DETECTED Final   Salmonella species NOT DETECTED NOT DETECTED Final   Yersinia enterocolitica NOT DETECTED NOT DETECTED Final   Vibrio species NOT DETECTED NOT DETECTED Final   Vibrio cholerae NOT DETECTED NOT DETECTED Final   Enteroaggregative E coli (EAEC) NOT DETECTED NOT DETECTED Final   Enteropathogenic E coli (EPEC) NOT DETECTED NOT DETECTED Final   Enterotoxigenic E coli (ETEC) NOT DETECTED NOT DETECTED Final   Shiga like toxin producing E coli (STEC) NOT DETECTED NOT DETECTED Final   Shigella/Enteroinvasive E coli (EIEC) NOT DETECTED NOT DETECTED Final   Cryptosporidium NOT DETECTED NOT DETECTED Final   Cyclospora cayetanensis NOT DETECTED NOT DETECTED Final   Entamoeba histolytica NOT DETECTED NOT DETECTED Final   Giardia lamblia NOT DETECTED NOT DETECTED Final   Adenovirus F40/41  NOT DETECTED NOT DETECTED Final   Astrovirus NOT DETECTED NOT DETECTED Final   Norovirus GI/GII NOT DETECTED NOT DETECTED Final   Rotavirus A NOT DETECTED NOT DETECTED Final   Sapovirus (I, II, IV, and V) NOT DETECTED NOT DETECTED Final  C difficile quick scan w PCR reflex     Status: None   Collection Time: 04/28/17 10:40 AM  Result Value Ref Range Status   C Diff antigen NEGATIVE NEGATIVE Final   C Diff toxin NEGATIVE NEGATIVE Final   C Diff interpretation No C. difficile detected.  Final  Urine culture     Status: Abnormal   Collection Time: 04/28/17  2:47 PM  Result Value Ref Range Status   Specimen Description URINE, RANDOM  Final   Special Requests NONE  Final   Culture <10,000 COLONIES/mL INSIGNIFICANT GROWTH (A)  Final   Report Status 04/29/2017 FINAL  Final  MRSA PCR Screening     Status: None   Collection Time: 04/30/17  6:51 PM  Result Value Ref Range Status   MRSA by PCR NEGATIVE NEGATIVE Final    Comment:        The GeneXpert MRSA Assay (FDA approved for NASAL specimens only), is one component of a comprehensive MRSA colonization surveillance program. It is not intended to diagnose MRSA infection nor to guide or monitor treatment for MRSA infections.     Studies/Results: No results found.    Assessment/Plan:  INTERVAL HISTORY:  Pt needs HD catheter and AVF prior to DC   Principal Problem:   Acute renal failure (ARF) (HCC) Active Problems:   Hypertension   HIV disease (HCC)   Asthma   Community acquired pneumonia of right lower lobe of lung (HCC)   Abdominal pain   Diarrhea   Anemia   AIDS (acquired immune deficiency syndrome) (HCC)   ESRD (end stage renal disease) (HCC)   Nausea vomiting and diarrhea   AKI (acute kidney injury) (HCC)   Dialysis patient (HCC)    James Schwartz is a 36 y.o. male with  HIV/AIDS admitted with ARF on CKD and CAP  #1 HIV/AIDS: in house continue Prezcobix, Edurant (RPV) and Tivicay (DTG)  At DC he will [facility]  [facility]ULUCA with chewable meal and avoiding acid suppression  (note I mistyped Edurant in place of juluca in this note earlier)  He will need to keep taking SS bactrim daily and would see if pharmacy can provide or it can be purchased for $4 at [facility]. DS bactrim TIW is another option  I wouldn't YET institute MAvium prophy since he may raise  His CD4 quickly  MIchelle did Emergency ADAP for him and RW yesterday  HE WILL NEED TO COME TO OUR CLINIC WEEK AFTER NEXT TO SEE ME OR ONE OF MY ID PHARMACISTS (WE WILL ARRANGE)  I HAVE TALKED TO MITCH MCGHEE TO SEE IF [facility]CH CAN PICK HIM UP AND DRIVE HIM TO HIS APPT (MITCH CANNOT DO NEXT WEEK)  HE DOES NOT HAVE RELIABLE PHONE NUMBER AND GAVE ME THAT OF HIS "WIFE" James Schwartz AT [phone]  When he comes to Clinic we will retest wife and try to get her on PrEP  #2 HD: fine to place AVF and HD catheter since he is not bacteremic  I will sign off for now and see pt in [facility] clinic   LOS: 5 days   Acey LavCornelius Van Dam 05/03/2017, 10:23 AM

## 2017-05-03 NOTE — Progress Notes (Signed)
[facility] Rounding note  SUBJECTIVE Brief HPI: 36 y/o M w/ uncontrolled HTN, HIV (current CD4 count 30), hx of CKD stage 4 (Cr 11/2015 3.9) now likely ESRD admitted 04/28/17 with 1 week of GI illness however had other uremic syx x1 month. Started HD 8/7 with good result. US with small echogenic kidneys. He has been non-compliant with his anti-retrovirals as he didn't feel like taking them.   Interval events:  Continues to feel improved.  Had 3rd HD yesterday, uneventful.  Had vein mapping yesterday.  Seen by VVS who plan for Sx Monday HR stable with PO cardizem 60mg  Q6H  Filed Weights   05/02/17 0938 05/02/17 1245 05/03/17 0536  Weight: 264 lb 1.8 oz (119.8 kg) 260 lb 9.3 oz (118.2 kg) 261 lb 0.4 oz (118.4 kg)   Weight change: -2 lb 6.8 oz (-1.1 kg)  I/O last 3 completed shifts: In: 3 [I.V.:3] Out: 1983 [Urine:400; Other:1583] No intake/output data recorded.  OBJECTIVE Vitals: Blood pressure 131/84, pulse 89, temperature 98.5 F (36.9 C), temperature source Oral, resp. rate 18, height 6\' 1"  (1.854 m), weight 261 lb 0.4 oz (118.4 kg), SpO2 97 %. Physical Exam   GEN: [name] resting in bed. Right HD temp cath 04/29/17. Mental status much clearer.  CV: RRR. No MGR.  PULM: CTA BL. Able to speak in complete sentences. Normal WOB on RA.  ABD: Soft. Not distended. +bs EXT: No peripheral edema.   Recent Labs Lab 04/28/17 0949 04/29/17 0549  05/01/17 0215 05/02/17 0322 05/03/17 0258  WBC 18.5* 10.7*  < > 7.8 6.8 6.3  NEUTROABS 13.9* 9.0*  --   --   --   --   HGB 9.2* 8.0*  < > 6.8* 7.8* 8.2*  HCT 26.7* 23.6*  < > 20.1* 23.1* 24.8*  MCV 85.6 85.2  < > 84.5 84.6 85.5  PLT 150 127*  < > 134* 135* 164  < > = values in this interval not displayed. Basic Metabolic Panel  Recent Labs Lab 04/28/17 0949 04/29/17 0549 04/30/17 0408 05/01/17 0215 05/02/17 0322 05/03/17 0258  NA 128* 133* 136 138 136 136  K 4.4 4.4 3.8 3.7 3.8 3.5  CL 97* 102 102 103 101 101  CO2 15* 18* 21* 25 26 26    GLUCOSE 119* 106* 100* 92 102* 93  BUN 102* 101* 100* 67* 34* 18  CREATININE 15.14* 14.37* 14.64* 11.20* 8.13* 6.64*  CALCIUM 8.5* 7.8* 7.5*  7.4* 7.0* 7.0* 7.3*  PHOS  --  7.3* 7.9* 5.0* 3.1 3.6   Medications . darbepoetin (ARANESP) injection - DIALYSIS  100 mcg Intravenous Q Wed-HD  . darunavir-cobicistat  1 tablet Oral Q breakfast  . dextromethorphan-guaiFENesin  1 tablet Oral BID  . diltiazem  60 mg Oral Q6H  . dolutegravir  50 mg Oral Daily  . feeding supplement  1 Container Oral TID BM  . heparin  5,000 Units Subcutaneous Q8H  . mouth rinse  15 mL Mouth Rinse BID  . multivitamin  1 tablet Oral QHS  . rilpivirine  25 mg Oral Q breakfast  . sevelamer carbonate  800 mg Oral TID WC  . sodium chloride flush  3 mL Intravenous Q12H  . sulfamethoxazole-trimethoprim  1 tablet Oral Daily   Continuous Infusions: . piperacillin-tazobactam (ZOSYN)  IV Stopped (05/03/17 0132)   IMAGING:  CXR 04/29/17: Improving right basilar opacity consistent with improving pneumonia. Tip of R IJ catheter in expected location. No pneumothorax.   Background: This is a 36 y/o M with  poorly controlled HTN and HIV here with acute on chronic renal failure in setting of acute GI illness and RLL PNA. Likely new ESRD. Has temp HD cath as pt febrile with PNA however ordering vein mapping in anticipation of graft vs fistula placement.   ASSESSMENT & PLAN  1. Acute on Chronic Renal Failure, new ESRD: Via R temp HD cath placed 8/6, first HD 8/7. S/p 3 HD sessions Continues to feel well after initiation of HD.  -Vein mapping done, VVS has seen planning for R RC vs BC AVF/tunnelling of HD cath Monday.  - Has HD spot TTS [facility] unit - could start as early as next Tuesday - if access gets done early on Monday could be D/C then and go to outpt on Tuesday,   2. RLL Pneumonia:  Completed course of Zosyn.   Remains afebrile.  -ID on board -OK to place tunneled HD cath if [facility] by ID  3. Advanced HIV Disease:   Several months of anti-retroviral non-compliance.  CD4 count now 30.  -Transitioned to [place]. He will need close follow up with ID outpatient.  Bactrim for PCP ppx  4. Hyperphosphatemia:  Phosphorus 3.1 today while on Revenla.  PTH 247. (In range for CKD 5 150-300)  -Continue Renvela 800mg  TID WC  5. Normocytic anemia:  Hb 8.2, 7.8 yesterday. Had first aranesp 100 mcg 8/8  He is not iron deficient -Follow CBC daily  James Chapel, DO Internal Medicine - PGY2 05/03/2017, 8:19 AM   I have seen and examined this patient and agree with plan and assessment in the above note with renal recommendations/intervention highlighted. Doing well with HD. Next TMT on Saturday. AVF/tunnelling of HD cath on Monday. Has outpt HD spot [facility] TTS 2nd shift.   James Swarthout B,MD 05/03/2017 1:16 PM

## 2017-05-03 NOTE — Progress Notes (Signed)
Discussed with pt that his vein mapping revealed that the vein is much better in the right arm.  Discussed the procedure and questions answered.   Pt knows not to eat or drink after MN Sunday night.    Doreatha MassedSamantha Derry [place], [facility] 05/03/2017 10:57 AM

## 2017-05-03 NOTE — Progress Notes (Signed)
  Subjective: Patient seen and examined at bedside. No acute events overnight.  He states that he is feeling much better, and is in a better mood. He denies any chest pain, difficulty breathing, nausea/vomitting, diarrhea or new weakness.  Objective: Vital signs in last 24 hours: Vitals:   05/02/17 2052 05/03/17 0019 05/03/17 0536 05/03/17 1126  BP: 127/87 124/88 131/84 135/86  Pulse: 98 83 89 91  Resp: 20 20 18    Temp: 99 F (37.2 C) 98.7 F (37.1 C) 98.5 F (36.9 C) 99.2 F (37.3 C)  TempSrc: Oral Oral Oral Oral  SpO2: 99% 99% 97% 97%  Weight:   118.4 kg (261 lb 0.4 oz)   Height:       Weight change: -1.1 kg (-2 lb 6.8 oz)  Intake/Output Summary (Last 24 hours) at 05/03/17 1350 Last data filed at 05/03/17 1100  Gross per 24 hour  Intake              593 ml  Output              100 ml  Net              493 ml   BP 135/86 (BP Location: Left Arm)   Pulse 91   Temp 99.2 F (37.3 C) (Oral)   Resp 18   Ht 6\' 1"  (1.854 m)   Wt 118.4 kg (261 lb 0.4 oz)   SpO2 97%   BMI 34.44 kg/m  General appearance: alert, cooperative and no distress Head: Normocephalic, without obvious abnormality, atraumatic Lungs: wheezes LUL and RUL Heart: regular rate and rhythm, S1, S2 normal, no murmur, click, rub or gallop Extremities: extremities normal, atraumatic, no cyanosis or edema Skin: Widespread Xerosis present  Neck: Right IJ HD catheter present  Assessment/Plan: Acute on Chronic Renal Failure  Patient will receive dialysis on Saturday(8/11) and transition to a Tuesday Thursday Saturday dialysis schedule. He received vein mapping yesterday and will receive AV fistula placement and placement of tunneled dialysis catheter on Monday 8/13. Case managment is working on finding him an outpatient dialysis center.   We are monitoring his BUN,Creatinine and electrolyte closely. He continues Renvela and Aranesp per Nephrology.  Supraventricular Tachycardia  Patient has been in sinus rhythm for  48hr, will continue 60mg  diltiazem Q6hr. Will discontinue telemetry.   Community Acquired Pneumonia of the Right Lower Lobe  Resolved. Will discontinue zosyn today.   HIV   Patient will continue triple antiretroviral therapy( Prezcobix,Tivicay and Edurant). Due to this patient's hiatus from anti-retrovirals we will monitor him closely for symptoms of immune reconstitution inflammatory syndrome. He is on Bactrim for PCP prophylaxis.  Per ID his anti-retrovirals will be transitioned to [place]tu[place] and Edurant upon discharge.    Disposition: Patient will be discharged after AVF placement and patient is accepted at outpatient dialysis center.    This is a Psychologist, occupationalMedical Student Note.  The care of the patient was discussed with Dr. Earlene PlaterWallace and the assessment and plan formulated with their assistance.  Please see their attached note for official documentation of the daily encounter.   LOS: 5 days   Avva, Junius FinnerKalyani L, Medical Student 05/03/2017, 1:50 PM   Attestation for Student Documentation:  I personally was present and performed or re-performed the history, physical exam and medical decision-making activities of this service and have verified that the service and findings are accurately documented in the student's note.  Gwynn BurlyWallace, Shawnelle Spoerl, DO 05/03/2017, 3:01 PM

## 2017-05-03 NOTE — Progress Notes (Signed)
CSW consulted for emergency Medicaid needs for patient. CSW referred patient to Artistfinancial counselor. RNCM aware and following patient.   James ButtsSusan Dorethia Schwartz, LCSWA [phone]

## 2017-05-03 NOTE — Progress Notes (Signed)
Nutrition Follow-up  DOCUMENTATION CODES:   Severe malnutrition in context of acute illness/injury, Obesity unspecified  INTERVENTION:    Recommend liberalizing diet to low sodium. Potassium & phosphorus are WNL, no need to restrict in diet.  D/C Boost Breeze, patient is refusing to drink  Mighty Shake II once daily with lunch, each supplement provides 480-500 kcals and 20-23 grams of protein  NUTRITION DIAGNOSIS:   Malnutrition (Severe) related to acute illness (Nausea and Vomiting) as evidenced by mild depletion of muscle mass, energy intake < or equal to 50% for > or equal to 5 days, moderate depletions of muscle mass.  Ongoing  GOAL:   Patient will meet greater than or equal to 90% of their needs  Unmet  MONITOR:   PO intake, Supplement acceptance, Labs, I & O's  ASSESSMENT:   Pt with PMH of HIV, CKD, and asthma presents with acute on chronic renal failure. Pt reports ongoing nausea, vomiting, and diarrhea.   Per discussion with RN, patient has been consuming ~75% of meals. He is refusing to drink the Boost Breeze supplements. Intake has improved greatly, but with increased nutrition requirements, suspect intake is still inadequate. Will try Mighty Shake supplements with lunch daily. Labs and medications reviewed.  Diet Order:  Diet renal with fluid restriction Fluid restriction: 1200 mL Fluid; Room service appropriate? Yes; Fluid consistency: Thin Diet NPO time specified Except for: Sips with Meds  Skin:  Reviewed, no issues  Last BM:  8/10  Height:   Ht Readings from Last 1 Encounters:  04/29/17 6\' 1"  (1.854 m)    Weight:   Wt Readings from Last 1 Encounters:  05/03/17 261 lb 0.4 oz (118.4 kg)    Ideal Body Weight:  83.6 kg  BMI:  Body mass index is 34.44 kg/m.  Estimated Nutritional Needs:   Kcal:  2300-2600  Protein:  110-130 gm  Fluid:  <1.2 L/d  EDUCATION NEEDS:   Education needs no appropriate at this time  Joaquin CourtsKimberly Jonne Rote, RD, LDN,  CNSC Pager [phone] After Hours Pager [phone]

## 2017-05-04 LAB — RENAL FUNCTION PANEL
ALBUMIN: 1.9 g/dL — AB (ref 3.5–5.0)
ANION GAP: 8 (ref 5–15)
BUN: 23 mg/dL — ABNORMAL HIGH (ref 6–20)
CALCIUM: 7.5 mg/dL — AB (ref 8.9–10.3)
CO2: 26 mmol/L (ref 22–32)
Chloride: 102 mmol/L (ref 101–111)
Creatinine, Ser: 8.3 mg/dL — ABNORMAL HIGH (ref 0.61–1.24)
GFR calc non Af Amer: 7 mL/min — ABNORMAL LOW (ref >60)
GFR, EST AFRICAN AMERICAN: 9 mL/min — AB (ref >60)
Glucose, Bld: 100 mg/dL — ABNORMAL HIGH (ref 65–99)
PHOSPHORUS: 3.6 mg/dL (ref 2.5–4.6)
POTASSIUM: 3.4 mmol/L — AB (ref 3.5–5.1)
SODIUM: 136 mmol/L (ref 135–145)

## 2017-05-04 MED ORDER — HEPARIN SODIUM (PORCINE) 1000 UNIT/ML DIALYSIS
20.0000 [IU]/kg | INTRAMUSCULAR | Status: DC | PRN
  Administered 2017-05-04: 2400 [IU] via INTRAVENOUS_CENTRAL
  Filled 2017-05-04: qty 3

## 2017-05-04 NOTE — Progress Notes (Signed)
Dialysis treatment completed.  2500 mL ultrafiltrated.  2000 mL net fluid removal.  Patient status unchanged. Lung sounds diminished to ausculation in all fields. No edema. Cardiac: NSR.  Cleansed RIJ catheter with chlorhexidine.  Disconnected lines and flushed ports with saline per protocol.  Ports locked with heparin and capped per protocol.    Report given to bedside, RN Noreene LarssonJill.

## 2017-05-04 NOTE — Care Management Note (Signed)
Case Management Note  Patient Details  Name: Rory PercyMaurice Sealy MRN: [id number] Date of Birth: Jul 27, 1981  Subjective/Objective:   36 y.o. Awaiting outpt HD arrangements. [place] from [place] [facility] [address] where he can apply for transfer of these services as we do not have ability to assist in-house. No voiced questions. Acknowledged instructions               Action/Plan:CM will sign off for now but will be available should additional discharge needs arise or disposition change.    Expected Discharge Date:                  Expected Discharge Plan:  Home/Self Care  In-House Referral:  Clinical Social Work  Discharge planning Services  CM Consult  Post Acute Care Choice:  NA Choice offered to:  NA  DME Arranged:  N/A DME Agency:  NA  HH Arranged:  NA HH Agency:  NA  Status of Service:  Completed, signed off  If discussed at Long Length of Stay Meetings, dates discussed:    Additional Comments:  Yvone NeuCrutchfield, Brylinn Teaney M, RN 05/04/2017, 9:26 AM

## 2017-05-04 NOTE — Progress Notes (Signed)
[facility] Rounding note  SUBJECTIVE  Interval events:  Resting this morning in bed.  Has no complaints today.  Filed Weights   05/02/17 1245 05/03/17 0536 05/04/17 0419  Weight: 260 lb 9.3 oz (118.2 kg) 261 lb 0.4 oz (118.4 kg) 263 lb (119.3 kg)   Weight change: -1 lb 1.8 oz (-0.504 kg)  I/O last 3 completed shifts: In: 953 [P.O.:950; I.V.:3] Out: 300 [Urine:300] No intake/output data recorded.  OBJECTIVE Vitals: Blood pressure 131/84, pulse 89, temperature 98.5 F (36.9 C), temperature source Oral, resp. rate 18, height 6\' 1"  (1.854 m), weight 261 lb 0.4 oz (118.4 kg), SpO2 97 %. Physical Exam   GEN:  resting in bed. Right HD temp cath 04/29/17.  CV: RRR. No MGR.  PULM: CTA BL. Able to speak in complete sentences. Normal WOB on RA.  ABD: Soft. Not distended. +bs EXT: No peripheral edema.   Recent Labs Lab 04/28/17 0949 04/29/17 0549  05/01/17 0215 05/02/17 0322 05/03/17 0258  WBC 18.5* 10.7*  < > 7.8 6.8 6.3  NEUTROABS 13.9* 9.0*  --   --   --   --   HGB 9.2* 8.0*  < > 6.8* 7.8* 8.2*  HCT 26.7* 23.6*  < > 20.1* 23.1* 24.8*  MCV 85.6 85.2  < > 84.5 84.6 85.5  PLT 150 127*  < > 134* 135* 164  < > = values in this interval not displayed.   Recent Labs Lab 04/28/17 0949 04/29/17 0549 04/30/17 0408 05/01/17 0215 05/02/17 0322 05/03/17 0258 05/04/17 0325  NA 128* 133* 136 138 136 136 136  K 4.4 4.4 3.8 3.7 3.8 3.5 3.4*  CL 97* 102 102 103 101 101 102  CO2 15* 18* 21* 25 26 26 26   GLUCOSE 119* 106* 100* 92 102* 93 100*  BUN 102* 101* 100* 67* 34* 18 23*  CREATININE 15.14* 14.37* 14.64* 11.20* 8.13* 6.64* 8.30*  CALCIUM 8.5* 7.8* 7.5*  7.4* 7.0* 7.0* 7.3* 7.5*  PHOS  --  7.3* 7.9* 5.0* 3.1 3.6 3.6   Medications . darbepoetin (ARANESP) injection - DIALYSIS  100 mcg Intravenous Q Wed-HD  . darunavir-cobicistat  1 tablet Oral Q breakfast  . dextromethorphan-guaiFENesin  1 tablet Oral BID  . diltiazem  60 mg Oral Q6H  . dolutegravir  50 mg Oral Daily  . heparin   5,000 Units Subcutaneous Q8H  . mouth rinse  15 mL Mouth Rinse BID  . multivitamin  1 tablet Oral QHS  . rilpivirine  25 mg Oral Q breakfast  . sevelamer carbonate  800 mg Oral TID WC  . sodium chloride flush  3 mL Intravenous Q12H  . sulfamethoxazole-trimethoprim  1 tablet Oral Daily    IMAGING:  CXR 04/29/17: Improving right basilar opacity consistent with improving pneumonia. Tip of R IJ catheter in expected location. No pneumothorax.   Background:  36 y/o M w/ uncontrolled HTN, uncontrolled HIV (current CD4 count 30;non-compliant with his anti-retrovirals as he didn't feel like taking them), hx of CKD stage 4 (Cr 11/2015 3.9)  Admitted 04/28/17 with 1 week of GI illness + other uremic sx x1 month. Creatinine 15 on admission, with metabolic acidosis, anemia.  New HD initiated 8/7 via r IJ temp cath with marked improvement in uremic sx. [place] with small echogenic kidneys. New ESRD.   ASSESSMENT & PLAN  1. New ESRD: Via R temp HD cath placed 8/6, first HD 8/7. S/p 3 HD sessions Continues to feel well after initiation of HD.   -HD today  -  AV fistula placement and placement of tunneled dialysis catheter on Monday 8/13 per vascular surgery .  - [facility] - could start as early as next Tuesday - if access gets done early on Monday could be D/C then and go to outpt on Tuesday   2. RLL Pneumonia:  Completed course of Zosyn.   Remains afebrile.  -ID on board  3. Advanced HIV Disease:  Several months of anti-retroviral non-compliance.  CD4 count now 30.  -continue with in house prezcobix, edurant and tivicay -Transition to [place] and [place] at discharge. He will need close follow up with ID outpatient.  -Bactrim for PCP ppx  4. Hyperphosphatemia:  Phosphorus 3.6 today   PTH 247. (In range for CKD 5 150-300)  -Continue Renvela 800mg  TID WC  5. Normocytic anemia:  Hb 8.2 yesterday and stable. Had first aranesp 100 mcg 8/8  -Follow CBC daily  Geralyn CorwinJessica Hoffman  PGY-2 Internal medicine  05/04/2017, 11:00 AM   I have seen and examined this patient and agree with plan and assessment in the above note with renal recommendations/intervention highlighted. New ESRD 2/2 HTN +/- uncontrolled HIV. Marked improvement after HD X3. Will Have HD today to get onto outpt schedule. [facility], then hopefully home (and if not Monday, then Tuesday after HD). [facility] 2nd shift.   Riyansh Gerstner B,MD 05/04/2017 1:29 PM

## 2017-05-04 NOTE — Progress Notes (Signed)
Patient arrived to unit by bed.  Reviewed treatment plan and this RN agrees with plan.  Report received from bedside RN, Josh.  Consent verified.  Patient A & O X 4.   Lung sounds diminished to ausculation in all fields. No edema. Cardiac:  NSR.  Removed caps and cleansed RIJ catheter with chlorhedxidine.  Aspirated ports of heparin and flushed them with saline per protocol.  Connected and secured lines, initiated treatment at 1749.  UF Goal of 2500 mL and net fluid removal 2 L.  Will continue to monitor.

## 2017-05-04 NOTE — Progress Notes (Signed)
   Subjective:  Patient resting comfortably this morning.  No new complaints.  For HD today.  AV fistula planned Monday.    Objective:  Vital signs in last 24 hours: Vitals:   05/03/17 1700 05/03/17 2202 05/04/17 0010 05/04/17 0419  BP: 133/87 123/75 132/82 134/80  Pulse: 90 80 89 84  Resp:   18 18  Temp: 98.4 F (36.9 C) 99 F (37.2 C) 98.7 F (37.1 C) 98.4 F (36.9 C)  TempSrc: Oral  Oral Oral  SpO2: 100% 100% 98% 98%  Weight:    263 lb (119.3 kg)  Height:       General: resting in bed Cardiac: RRR, no rubs, murmurs or gallops Pulm: clear to auscultation bilaterally Neck: R IJ HD catheter in place Neuro: alert and oriented X3, cranial nerves II-XII grossly intact   Assessment/Plan:  Acute on Chronic Renal Failure with Progression to ESRD Has tolerated HD x 3 while inpatient.  Outpatient HD being established.  Nephology following - Appreciate nephrology assistance with management of this patient - On Renvela and Aranesp - HD today to continue Tu, Th, Sa schedule  Community Acquired Pneumonia Status post treatment with IV antibiotics with appropriate clinical response.  HIV/AIDS - Appreciate ID assistance with management - Continue TMP-SMX for PCP prophylaxis.  Holding off on MAC prophylaxis - Continue ART with Prezcobix, Tivicay, and Edurant - At Discharge, transition to [place].  Medication assistance has been arranged with ID.   HTN SVT - HR and BP controlled on Diltiazem 3m every 6 hours.  Plan transition to daily dose prior to discharge.  Normocytic Anemia Hgb stable at 8.2 yesterday.   - Follow CBC  Dispo: Anticipated discharge in approximately 3 day(s).   WJule Ser DO 05/04/2017, 8:32 AM Pager: [phone]

## 2017-05-05 LAB — RENAL FUNCTION PANEL
Albumin: 2.1 g/dL — ABNORMAL LOW (ref 3.5–5.0)
Anion gap: 9 (ref 5–15)
BUN: 10 mg/dL (ref 6–20)
CALCIUM: 7.8 mg/dL — AB (ref 8.9–10.3)
CO2: 29 mmol/L (ref 22–32)
Chloride: 98 mmol/L — ABNORMAL LOW (ref 101–111)
Creatinine, Ser: 5.44 mg/dL — ABNORMAL HIGH (ref 0.61–1.24)
GFR calc non Af Amer: 12 mL/min — ABNORMAL LOW (ref >60)
GFR, EST AFRICAN AMERICAN: 14 mL/min — AB (ref >60)
GLUCOSE: 86 mg/dL (ref 65–99)
PHOSPHORUS: 2.8 mg/dL (ref 2.5–4.6)
Potassium: 3.4 mmol/L — ABNORMAL LOW (ref 3.5–5.1)
Sodium: 136 mmol/L (ref 135–145)

## 2017-05-05 LAB — CBC
HEMATOCRIT: 27.1 % — AB (ref 39.0–52.0)
HEMOGLOBIN: 8.8 g/dL — AB (ref 13.0–17.0)
MCH: 28.1 pg (ref 26.0–34.0)
MCHC: 32.5 g/dL (ref 30.0–36.0)
MCV: 86.6 fL (ref 78.0–100.0)
Platelets: 238 10*3/uL (ref 150–400)
RBC: 3.13 MIL/uL — ABNORMAL LOW (ref 4.22–5.81)
RDW: 14.8 % (ref 11.5–15.5)
WBC: 6.3 10*3/uL (ref 4.0–10.5)

## 2017-05-05 MED ORDER — DILTIAZEM HCL ER COATED BEADS 240 MG PO CP24
240.0000 mg | ORAL_CAPSULE | Freq: Every day | ORAL | Status: DC
  Administered 2017-05-05 – 2017-05-06 (×2): 240 mg via ORAL
  Filled 2017-05-05 (×2): qty 1

## 2017-05-05 NOTE — Progress Notes (Signed)
  Subjective: Patient seen and examined at bedside. No acute events overnight. He denies any pain, nausea/vomitting or difficulty breathing. He confirms understanding of upcoming AV fistula placement and tunneled HD catheter.   Objective: Vital signs in last 24 hours: Vitals:   05/04/17 2149 05/04/17 2152 05/04/17 2243 05/05/17 0500  BP: 128/85 136/85 (!) 145/83 (!) 125/99  Pulse: 75 68 87 83  Resp: 14  17 18   Temp: 98 F (36.7 C)  98.4 F (36.9 C) 98.6 F (37 C)  TempSrc:   Oral Oral  SpO2:   97% 100%  Weight: 116.8 kg (257 lb 8 oz)   116.9 kg (257 lb 11.5 oz)  Height:       Weight change: -0.496 kg (-1 lb 1.5 oz)  Intake/Output Summary (Last 24 hours) at 05/05/17 0955 Last data filed at 05/04/17 2246  Gross per 24 hour  Intake                0 ml  Output             2650 ml  Net            -2650 ml   BP (!) 125/99 (BP Location: Left Arm)   Pulse 83   Temp 98.6 F (37 C) (Oral)   Resp 18   Ht 6\' 1"  (1.854 m)   Wt 116.9 kg (257 lb 11.5 oz)   SpO2 100%   BMI 34.00 kg/m  General appearance: alert, cooperative and no distress Head: Normocephalic, without obvious abnormality, atraumatic Lungs: wheezes LLL and RLL Heart: regular rate and rhythm, S1, S2 normal, no murmur, click, rub or gallop Extremities: extremities normal, atraumatic, no cyanosis or edema Skin: Widspread xerosis present   Assessment/Plan: Acute on Chronic Renal Failure  Patient on Tuesday Thursday Saturday dialysis schedule.  Will receive AV fistula placement and placement of tunneled dialysis catheter on Monday 8/13.  Outpatient placement at a dialysis center has been established.   We are monitoring his BUN,Creatinine and electrolyte closely. He continues Renvela and Aranesp per Nephrology.  Supraventricular Tachycardia  Patient has been in sinus rhythm for 4 days , will transition to 240mg  diltazem Q24hr.   Community Acquired Pneumonia of the Right Lower Lobe  Resolved.   HIV  Patient  will continuetriple antiretroviral therapy( Prezcobix,Tivicay and Edurant). Due to this patient's hiatus from anti-retrovirals we will monitor him closely for symptoms of immune reconstitution inflammatory syndrome. He is on Bactrim for PCP prophylaxis.  Per ID his anti-retrovirals will be transitioned to [place] and Edurant upon discharge.    Normocytic Anemia Hgb stable at 8.8 today.     Disposition: Patient will be discharged after monitoring period post AVF placement    This is a Psychologist, occupationalMedical Student Note.  The care of the patient was discussed with Dr.Jaretssi Kraker and the assessment and plan formulated with their assistance.  Please see their attached note for official documentation of the daily encounter.   LOS: 7 days   Avva, Junius FinnerKalyani L, Medical Student 05/05/2017, 9:55 AM   Attestation for Student Documentation:  I personally was present and performed or re-performed the history, physical exam and medical decision-making activities of this service and have verified that the service and findings are accurately documented in the student's note.  Gwynn BurlyWallace, Jenavieve Freda, DO 05/05/2017, 10:34 AM

## 2017-05-05 NOTE — Progress Notes (Signed)
[facility] Rounding note  SUBJECTIVE  Interval events:  Resting this morning in bed.  Has no complaints today. Uneventful HD session yesterday, 2L net removed.   Filed Weights   05/04/17 1744 05/04/17 2149 05/05/17 0500  Weight: 261 lb 14.5 oz (118.8 kg) 257 lb 8 oz (116.8 kg) 257 lb 11.5 oz (116.9 kg)   Weight change: -1 lb 1.5 oz (-0.496 kg)  I/O last 3 completed shifts: In: 360 [P.O.:360] Out: 2850 [Urine:850; Other:2000] No intake/output data recorded.  OBJECTIVE Vitals: Blood pressure (!) 125/99, pulse 83, temperature 98.6 F (37 C), temperature source Oral, resp. rate 18, height 6\' 1"  (1.854 m), weight 257 lb 11.5 oz (116.9 kg), SpO2 100 %. Physical Exam   GEN:  resting in bed. Right IJ HD temp cath 04/29/17.  CV: RRR. No MGR.  PULM: CTA BL. Able to speak in complete sentences. Normal WOB on RA.  ABD: Soft. Not distended. +bs EXT: No peripheral edema.   Recent Labs Lab 04/28/17 0949 04/29/17 0549  05/02/17 0322 05/03/17 0258 05/05/17 0250  WBC 18.5* 10.7*  < > 6.8 6.3 6.3  NEUTROABS 13.9* 9.0*  --   --   --   --   HGB 9.2* 8.0*  < > 7.8* 8.2* 8.8*  HCT 26.7* 23.6*  < > 23.1* 24.8* 27.1*  MCV 85.6 85.2  < > 84.6 85.5 86.6  PLT 150 127*  < > 135* 164 238  < > = values in this interval not displayed.   Recent Labs Lab 04/29/17 0549 04/30/17 0408 05/01/17 0215 05/02/17 0322 05/03/17 0258 05/04/17 0325 05/05/17 0250  NA 133* 136 138 136 136 136 136  K 4.4 3.8 3.7 3.8 3.5 3.4* 3.4*  CL 102 102 103 101 101 102 98*  CO2 18* 21* 25 26 26 26 29   GLUCOSE 106* 100* 92 102* 93 100* 86  BUN 101* 100* 67* 34* 18 23* 10  CREATININE 14.37* 14.64* 11.20* 8.13* 6.64* 8.30* 5.44*  CALCIUM 7.8* 7.5*  7.4* 7.0* 7.0* 7.3* 7.5* 7.8*  PHOS 7.3* 7.9* 5.0* 3.1 3.6 3.6 2.8   Medications . darbepoetin (ARANESP) injection - DIALYSIS  100 mcg Intravenous Q Wed-HD  . darunavir-cobicistat  1 tablet Oral Q breakfast  . dextromethorphan-guaiFENesin  1 tablet Oral BID  . diltiazem  60  mg Oral Q6H  . dolutegravir  50 mg Oral Daily  . heparin  5,000 Units Subcutaneous Q8H  . mouth rinse  15 mL Mouth Rinse BID  . multivitamin  1 tablet Oral QHS  . rilpivirine  25 mg Oral Q breakfast  . sevelamer carbonate  800 mg Oral TID WC  . sodium chloride flush  3 mL Intravenous Q12H  . sulfamethoxazole-trimethoprim  1 tablet Oral Daily    Background:  36 y/o M w/ uncontrolled HTN, uncontrolled HIV (CD4 count 30;non-compliant with anti-retrovirals), hx of CKD stage 4 (Cr 11/2015 3.9)  Admitted 04/28/17 with 1 week of GI illness + other uremic sx x1 month. Creatinine 15 on admission, with metabolic acidosis, anemia.  New HD initiated 8/7 via r IJ temp cath with marked improvement in uremic sx. [place] with small echogenic kidneys. New ESRD.   ASSESSMENT & PLAN  1. New ESRD: Via R temp HD cath placed 8/6, first HD 8/7. S/p 4 HD sessions Continues to feel well after initiation of HD. Had uneventful HD session yest with 2L net removed.  - AV fistula placement and placement of tunneled dialysis catheter on Monday 8/13 per vascular surgery .  -  Has HD spot TTS [facility] unit, 2nd shift - could start as early as next Tuesday - if access gets done early on Monday could be D/C then and go to outpt on Tuesday   2. RLL Pneumonia:  Completed course of Zosyn.   3. Advanced HIV Disease:  Several months of anti-retroviral non-compliance.  CD4 count now 30.  -continue with in house prezcobix, edurant and tivicay -Transition to [place] and [place] at discharge. He will need close follow up with ID outpatient.  -Bactrim for PCP ppx  4. Hyperphosphatemia:  Phosphorus 2.8 today   PTH 247. (In range for CKD 5 150-300)  -Continue Renvela 800mg  TID WC  5. Normocytic anemia:  Hb 8.8 today and stable. Had first aranesp 100 mcg 8/8  -Follow CBC daily  Bethany Molt, DO Internal medicine  05/05/2017, 8:45 AM   I have seen and examined this patient and agree with plan and assessment in the above note  with renal recommendations/intervention highlighted. For permanent access (AVF) and [facility] tomorrow and if stable could be discharged and start outpt HD at [facility] on Tuesday (if not able to discharge, then HD on Tuesday here then home.   Breleigh Carpino B,MD 05/05/2017 10:52 AM

## 2017-05-05 NOTE — Anesthesia Preprocedure Evaluation (Addendum)
Anesthesia Evaluation  Patient identified by MRN, date of birth, ID band Patient awake    Reviewed: Allergy & Precautions, H&P , NPO status , Patient's Chart, lab work & pertinent test results  Airway Mallampati: II  TM Distance: >3 FB Neck ROM: Full    Dental no notable dental hx. (+) Teeth Intact, Dental Advisory Given   Pulmonary asthma ,    Pulmonary exam normal breath sounds clear to auscultation       Cardiovascular Exercise Tolerance: Good hypertension,  Rhythm:Regular Rate:Normal     Neuro/Psych negative neurological ROS  negative psych ROS   GI/Hepatic negative GI ROS, Neg liver ROS,   Endo/Other  negative endocrine ROS  Renal/GU ESRF and DialysisRenal disease  negative genitourinary   Musculoskeletal   Abdominal   Peds  Hematology  (+) anemia , HIV,   Anesthesia Other Findings   Reproductive/Obstetrics negative OB ROS                            Anesthesia Physical Anesthesia Plan  ASA: III  Anesthesia Plan: General   Post-op Pain Management:    Induction: Intravenous  PONV Risk Score and Plan: 3 and Ondansetron, Dexamethasone and Midazolam  Airway Management Planned: LMA  Additional Equipment:   Intra-op Plan:   Post-operative Plan: Extubation in OR  Informed Consent: I have reviewed the patients History and Physical, chart, labs and discussed the procedure including the risks, benefits and alternatives for the proposed anesthesia with the patient or authorized representative who has indicated his/her understanding and acceptance.   Dental advisory given  Plan Discussed with: CRNA  Anesthesia Plan Comments:        Anesthesia Quick Evaluation

## 2017-05-06 ENCOUNTER — Inpatient Hospital Stay (HOSPITAL_COMMUNITY): Payer: Medicaid Other | Admitting: Anesthesiology

## 2017-05-06 ENCOUNTER — Encounter (HOSPITAL_COMMUNITY): Payer: Self-pay | Admitting: Anesthesiology

## 2017-05-06 ENCOUNTER — Telehealth: Payer: Self-pay | Admitting: Vascular Surgery

## 2017-05-06 ENCOUNTER — Inpatient Hospital Stay (HOSPITAL_COMMUNITY): Payer: Medicaid Other

## 2017-05-06 ENCOUNTER — Encounter (HOSPITAL_COMMUNITY)
Admission: EM | Disposition: A | Discharge status: Home / Self Care | Payer: Self-pay | Source: Home / Self Care | Attending: Oncology

## 2017-05-06 DIAGNOSIS — Z992 Dependence on renal dialysis: Secondary | ICD-10-CM

## 2017-05-06 DIAGNOSIS — N186 End stage renal disease: Secondary | ICD-10-CM

## 2017-05-06 HISTORY — PX: AV FISTULA PLACEMENT: SHX1204

## 2017-05-06 HISTORY — PX: EXCHANGE OF A DIALYSIS CATHETER: SHX5818

## 2017-05-06 LAB — RENAL FUNCTION PANEL
ANION GAP: 9 (ref 5–15)
Albumin: 2.2 g/dL — ABNORMAL LOW (ref 3.5–5.0)
BUN: 18 mg/dL (ref 6–20)
CALCIUM: 8.1 mg/dL — AB (ref 8.9–10.3)
CO2: 27 mmol/L (ref 22–32)
CREATININE: 7.82 mg/dL — AB (ref 0.61–1.24)
Chloride: 99 mmol/L — ABNORMAL LOW (ref 101–111)
GFR, EST AFRICAN AMERICAN: 9 mL/min — AB (ref >60)
GFR, EST NON AFRICAN AMERICAN: 8 mL/min — AB (ref >60)
Glucose, Bld: 87 mg/dL (ref 65–99)
Phosphorus: 5 mg/dL — ABNORMAL HIGH (ref 2.5–4.6)
Potassium: 3.8 mmol/L (ref 3.5–5.1)
SODIUM: 135 mmol/L (ref 135–145)

## 2017-05-06 SURGERY — ARTERIOVENOUS (AV) FISTULA CREATION
Anesthesia: General | Site: Arm Lower | Laterality: Right

## 2017-05-06 MED ORDER — PHENYLEPHRINE HCL 10 MG/ML IJ SOLN
INTRAMUSCULAR | Status: DC | PRN
  Administered 2017-05-06 (×4): 80 ug via INTRAVENOUS

## 2017-05-06 MED ORDER — DEXTROSE 5 % IV SOLN
INTRAVENOUS | Status: AC
  Filled 2017-05-06: qty 1.5

## 2017-05-06 MED ORDER — RENA-VITE PO TABS: 1.0000 | ORAL_TABLET | Freq: Every day | ORAL | 0 refills | Status: AC

## 2017-05-06 MED ORDER — HEPARIN SODIUM (PORCINE) 1000 UNIT/ML IJ SOLN
INTRAMUSCULAR | Status: AC
  Filled 2017-05-06: qty 1

## 2017-05-06 MED ORDER — ALBUMIN HUMAN 5 % IV SOLN
INTRAVENOUS | Status: DC | PRN
  Administered 2017-05-06: 09:00:00 via INTRAVENOUS

## 2017-05-06 MED ORDER — HEPARIN SODIUM (PORCINE) 5000 UNIT/ML IJ SOLN
INTRAMUSCULAR | Status: DC | PRN
  Administered 2017-05-06: 08:00:00

## 2017-05-06 MED ORDER — PROPOFOL 10 MG/ML IV BOLUS
INTRAVENOUS | Status: DC | PRN
  Administered 2017-05-06: 200 mg via INTRAVENOUS
  Administered 2017-05-06: 50 mg via INTRAVENOUS

## 2017-05-06 MED ORDER — BUPIVACAINE HCL (PF) 0.5 % IJ SOLN
INTRAMUSCULAR | Status: AC
  Filled 2017-05-06: qty 30

## 2017-05-06 MED ORDER — LIDOCAINE HCL (CARDIAC) 20 MG/ML IV SOLN
INTRAVENOUS | Status: DC | PRN
  Administered 2017-05-06: 60 mg via INTRAVENOUS

## 2017-05-06 MED ORDER — DILTIAZEM HCL ER COATED BEADS 240 MG PO CP24: 240.0000 mg | ORAL_CAPSULE | Freq: Every day | ORAL | 0 refills | Status: DC

## 2017-05-06 MED ORDER — HEMOSTATIC AGENTS (NO CHARGE) OPTIME
TOPICAL | Status: DC | PRN
  Administered 2017-05-06: 1 via TOPICAL

## 2017-05-06 MED ORDER — DARBEPOETIN ALFA 100 MCG/0.5ML IJ SOSY: 100.0000 ug | PREFILLED_SYRINGE | INTRAMUSCULAR | Status: AC

## 2017-05-06 MED ORDER — DEXTROSE 5 % IV SOLN
INTRAVENOUS | Status: DC | PRN
  Administered 2017-05-06: 1.5 g via INTRAVENOUS

## 2017-05-06 MED ORDER — LIDOCAINE-EPINEPHRINE (PF) 1 %-1:200000 IJ SOLN
INTRAMUSCULAR | Status: AC
  Filled 2017-05-06: qty 30

## 2017-05-06 MED ORDER — BUPIVACAINE HCL (PF) 0.5 % IJ SOLN
INTRAMUSCULAR | Status: DC | PRN
  Administered 2017-05-06: 30 mL

## 2017-05-06 MED ORDER — MIDAZOLAM HCL 2 MG/2ML IJ SOLN
INTRAMUSCULAR | Status: AC
  Filled 2017-05-06: qty 2

## 2017-05-06 MED ORDER — FENTANYL CITRATE (PF) 250 MCG/5ML IJ SOLN
INTRAMUSCULAR | Status: AC
  Filled 2017-05-06: qty 5

## 2017-05-06 MED ORDER — EPHEDRINE SULFATE 50 MG/ML IJ SOLN
INTRAMUSCULAR | Status: DC | PRN
  Administered 2017-05-06 (×2): 10 mg via INTRAVENOUS

## 2017-05-06 MED ORDER — SULFAMETHOXAZOLE-TRIMETHOPRIM 400-80 MG PO TABS: 1.0000 | ORAL_TABLET | Freq: Every day | ORAL | 0 refills | Status: DC

## 2017-05-06 MED ORDER — LIDOCAINE HCL (PF) 1 % IJ SOLN
INTRAMUSCULAR | Status: AC
  Filled 2017-05-06: qty 30

## 2017-05-06 MED ORDER — EPHEDRINE 5 MG/ML INJ
INTRAVENOUS | Status: AC
  Filled 2017-05-06: qty 10

## 2017-05-06 MED ORDER — OXYCODONE-ACETAMINOPHEN 5-325 MG PO TABS: 1.0000 | ORAL_TABLET | Freq: Four times a day (QID) | ORAL | Status: DC | PRN

## 2017-05-06 MED ORDER — HEPARIN SODIUM (PORCINE) 1000 UNIT/ML IJ SOLN
INTRAMUSCULAR | Status: DC | PRN
  Administered 2017-05-06: 1000 [IU] via INTRAVENOUS

## 2017-05-06 MED ORDER — PHENYLEPHRINE 40 MCG/ML (10ML) SYRINGE FOR IV PUSH (FOR BLOOD PRESSURE SUPPORT)
PREFILLED_SYRINGE | INTRAVENOUS | Status: AC
  Filled 2017-05-06: qty 10

## 2017-05-06 MED ORDER — FENTANYL CITRATE (PF) 100 MCG/2ML IJ SOLN
INTRAMUSCULAR | Status: DC | PRN
  Administered 2017-05-06 (×3): 50 ug via INTRAVENOUS

## 2017-05-06 MED ORDER — ONDANSETRON HCL 4 MG/2ML IJ SOLN
INTRAMUSCULAR | Status: AC
  Filled 2017-05-06: qty 2

## 2017-05-06 MED ORDER — OXYCODONE-ACETAMINOPHEN 5-325 MG PO TABS: 1.0000 | ORAL_TABLET | Freq: Four times a day (QID) | ORAL | 0 refills | Status: DC | PRN

## 2017-05-06 MED ORDER — SEVELAMER CARBONATE 800 MG PO TABS: 800.0000 mg | ORAL_TABLET | Freq: Three times a day (TID) | ORAL | 0 refills | Status: DC

## 2017-05-06 MED ORDER — SODIUM CHLORIDE 0.9 % IV SOLN
INTRAVENOUS | Status: DC | PRN
  Administered 2017-05-06: 07:00:00 via INTRAVENOUS

## 2017-05-06 MED ORDER — HYDROMORPHONE HCL 1 MG/ML IJ SOLN: 0.2500 mg | INTRAMUSCULAR | Status: DC | PRN

## 2017-05-06 MED ORDER — MIDAZOLAM HCL 5 MG/5ML IJ SOLN
INTRAMUSCULAR | Status: DC | PRN
  Administered 2017-05-06: 2 mg via INTRAVENOUS

## 2017-05-06 MED ORDER — 0.9 % SODIUM CHLORIDE (POUR BTL) OPTIME
TOPICAL | Status: DC | PRN
Start: 2017-05-06 — End: 2017-05-06
  Administered 2017-05-06: 1000 mL

## 2017-05-06 MED ORDER — LIDOCAINE 2% (20 MG/ML) 5 ML SYRINGE
INTRAMUSCULAR | Status: AC
  Filled 2017-05-06: qty 5

## 2017-05-06 MED ORDER — ONDANSETRON HCL 4 MG/2ML IJ SOLN
INTRAMUSCULAR | Status: DC | PRN
  Administered 2017-05-06: 4 mg via INTRAVENOUS

## 2017-05-06 MED ORDER — PROPOFOL 10 MG/ML IV BOLUS
INTRAVENOUS | Status: AC
  Filled 2017-05-06: qty 40

## 2017-05-06 MED ORDER — LIDOCAINE HCL (PF) 1 % IJ SOLN
INTRAMUSCULAR | Status: DC | PRN
  Administered 2017-05-06: 30 mL

## 2017-05-06 SURGICAL SUPPLY — 61 items
ARMBAND PINK RESTRICT EXTREMIT (MISCELLANEOUS) ×4 IMPLANT
BAG DECANTER FOR FLEXI CONT (MISCELLANEOUS) ×4 IMPLANT
BIOPATCH RED 1 DISK 7.0 (GAUZE/BANDAGES/DRESSINGS) ×3 IMPLANT
BIOPATCH RED 1IN DISK 7.0MM (GAUZE/BANDAGES/DRESSINGS) ×1
CANISTER SUCT 3000ML PPV (MISCELLANEOUS) ×4 IMPLANT
CATH PALINDROME RT-P 15FX19CM (CATHETERS) IMPLANT
CATH PALINDROME RT-P 15FX23CM (CATHETERS) ×4 IMPLANT
CATH PALINDROME RT-P 15FX28CM (CATHETERS) IMPLANT
CATH PALINDROME RT-P 15FX55CM (CATHETERS) IMPLANT
CLIP VESOCCLUDE MED 6/CT (CLIP) ×4 IMPLANT
CLIP VESOCCLUDE SM WIDE 6/CT (CLIP) ×4 IMPLANT
COVER PROBE W GEL 5X96 (DRAPES) ×4 IMPLANT
COVER SURGICAL LIGHT HANDLE (MISCELLANEOUS) ×4 IMPLANT
DECANTER SPIKE VIAL GLASS SM (MISCELLANEOUS) ×4 IMPLANT
DERMABOND ADVANCED (GAUZE/BANDAGES/DRESSINGS) ×4
DERMABOND ADVANCED .7 DNX12 (GAUZE/BANDAGES/DRESSINGS) ×4 IMPLANT
DRAPE C-ARM 42X72 X-RAY (DRAPES) ×4 IMPLANT
DRAPE CHEST BREAST 15X10 FENES (DRAPES) ×4 IMPLANT
DRSG COVADERM 4X6 (GAUZE/BANDAGES/DRESSINGS) ×4 IMPLANT
ELECT REM PT RETURN 9FT ADLT (ELECTROSURGICAL) ×4
ELECTRODE REM PT RTRN 9FT ADLT (ELECTROSURGICAL) ×2 IMPLANT
GAUZE SPONGE 2X2 8PLY STRL LF (GAUZE/BANDAGES/DRESSINGS) IMPLANT
GAUZE SPONGE 4X4 12PLY STRL (GAUZE/BANDAGES/DRESSINGS) ×4 IMPLANT
GAUZE SPONGE 4X4 16PLY XRAY LF (GAUZE/BANDAGES/DRESSINGS) ×4 IMPLANT
GLOVE BIO SURGEON STRL SZ 6.5 (GLOVE) ×3 IMPLANT
GLOVE BIO SURGEON STRL SZ7 (GLOVE) ×4 IMPLANT
GLOVE BIO SURGEONS STRL SZ 6.5 (GLOVE) ×1
GLOVE BIOGEL PI IND STRL 6.5 (GLOVE) ×8 IMPLANT
GLOVE BIOGEL PI IND STRL 7.5 (GLOVE) ×2 IMPLANT
GLOVE BIOGEL PI INDICATOR 6.5 (GLOVE) ×8
GLOVE BIOGEL PI INDICATOR 7.5 (GLOVE) ×2
GLOVE SURG SS PI 7.0 STRL IVOR (GLOVE) ×4 IMPLANT
GOWN STRL REUS W/ TWL LRG LVL3 (GOWN DISPOSABLE) ×6 IMPLANT
GOWN STRL REUS W/ TWL XL LVL3 (GOWN DISPOSABLE) ×2 IMPLANT
GOWN STRL REUS W/TWL LRG LVL3 (GOWN DISPOSABLE) ×6
GOWN STRL REUS W/TWL XL LVL3 (GOWN DISPOSABLE) ×2
HEMOSTAT SPONGE AVITENE ULTRA (HEMOSTASIS) ×4 IMPLANT
KIT BASIN OR (CUSTOM PROCEDURE TRAY) ×4 IMPLANT
KIT ROOM TURNOVER OR (KITS) ×4 IMPLANT
NEEDLE 18GX1X1/2 (RX/OR ONLY) (NEEDLE) ×4 IMPLANT
NEEDLE HYPO 25GX1X1/2 BEV (NEEDLE) ×4 IMPLANT
NS IRRIG 1000ML POUR BTL (IV SOLUTION) ×4 IMPLANT
PACK CV ACCESS (CUSTOM PROCEDURE TRAY) ×4 IMPLANT
PACK SURGICAL SETUP 50X90 (CUSTOM PROCEDURE TRAY) ×4 IMPLANT
PAD ARMBOARD 7.5X6 YLW CONV (MISCELLANEOUS) ×8 IMPLANT
SOAP 2 % CHG 4 OZ (WOUND CARE) ×4 IMPLANT
SPONGE GAUZE 2X2 STER 10/PKG (GAUZE/BANDAGES/DRESSINGS)
SUT ETHILON 3 0 PS 1 (SUTURE) ×4 IMPLANT
SUT MNCRL AB 4-0 PS2 18 (SUTURE) ×4 IMPLANT
SUT PROLENE 6 0 BV (SUTURE) IMPLANT
SUT PROLENE 7 0 BV 1 (SUTURE) ×4 IMPLANT
SUT VIC AB 3-0 SH 27 (SUTURE) ×2
SUT VIC AB 3-0 SH 27X BRD (SUTURE) ×2 IMPLANT
SYR 10ML LL (SYRINGE) ×4 IMPLANT
SYR 20CC LL (SYRINGE) ×8 IMPLANT
SYR 30ML LL (SYRINGE) IMPLANT
SYR 3ML LL SCALE MARK (SYRINGE) ×4 IMPLANT
SYR 5ML LL (SYRINGE) ×4 IMPLANT
SYR CONTROL 10ML LL (SYRINGE) ×4 IMPLANT
UNDERPAD 30X30 (UNDERPADS AND DIAPERS) ×4 IMPLANT
WATER STERILE IRR 1000ML POUR (IV SOLUTION) ×4 IMPLANT

## 2017-05-06 NOTE — Progress Notes (Signed)
CSW consulted for patient transportation needs. Patient does not have transportation to new dialysis center. [place] new to [place]. Patient's significant other was also at bedside. CSW assisted patient in completing [place] application and provided local bus information and bus passes. CSW completed professional verification portion of [employer] application and faxed to [employer]. CSW encouraged patient to fill out remainder of application and call [employer] to schedule interview. Patient and girlfriend indicated patient unable to take regular bus due to lethargy and feeling ill after dialysis treatment and would not even be able to take bus for this first week. CSW validated patient's experience with symptoms and empathized with lack of transportation resources available immediately. Patient and girlfriend indicated they would try to find alternate transportation for patient until [employer] is active.  CSW signing off as transportation issues addressed.  Abigail ButtsSusan Traniece Boffa, LCSWA [phone]

## 2017-05-06 NOTE — Care Management Note (Signed)
Case Management Note  Patient Details  Name: James Schwartz MRN: [id number] Date of Birth: 1981-02-06  Subjective/Objective: Pt presented for SOB- Pneumonia. Hx of ESRD and 042. [place]. ID is following for 042 medications. New Start to HD- [facility] [address]. CM did call center to verify. HD Center will need orders once stable for d/c. CM did contact attending team. Pt will have T,TH,SAT, Schedule. Start time 11:55 per Diplomatic Services operational officersecretary at [place].                    Action/Plan: No needs identified from this CM. Previous CM did provide pt with information in regards to Medicaid. Pt has Medicaid Advantage out of State.   Expected Discharge Date:                  Expected Discharge Plan:  Home/Self Care  In-House Referral:  Clinical Social Work  Discharge planning Services  CM Consult  Post Acute Care Choice:  NA Choice offered to:  NA  DME Arranged:  N/A DME Agency:  NA  HH Arranged:  NA HH Agency:  NA  Status of Service:  Completed, signed off  If discussed at Long Length of Stay Meetings, dates discussed:    Additional Comments:  [name], [name], RN 05/06/2017, 12:29 PM

## 2017-05-06 NOTE — H&P (View-Only) (Signed)
  Requested by:  Dr. Dunham (Nephrology)  Reason for consultation: New access   History of Present Illness   James Schwartz is a 36 y.o. (06/24/1981) male with AIDS who presents for evaluation for permanent access and tunneled dialysis catheter placement.  The patient is right hand dominant.  The patient has not had previous access procedures.  Previous central venous cannulation procedures include: right internal jugular vein temporary dialysis catheter.  The patient has never had a PPM placed.  This patient has not be compliant with his anti-HIV regimen.  Past Medical History:  Diagnosis Date  . Asthma   . HIV (human immunodeficiency virus infection) (HCC)     Past Surgical History:  Procedure Laterality Date  . IR FLUORO GUIDE CV LINE RIGHT  04/29/2017  . IR US GUIDE VASC ACCESS RIGHT  04/29/2017    Social History   Social History  . Marital status: Single    Spouse name: N/A  . Number of children: N/A  . Years of education: N/A   Occupational History  . Not on file.   Social History Main Topics  . Smoking status: Never Smoker  . Smokeless tobacco: Never Used  . Alcohol use No  . Drug use: No  . Sexual activity: Not on file   Other Topics Concern  . Not on file   Social History Narrative  . No narrative on file    Family History: patient is unable to detail the medical history of his parents   Current Facility-Administered Medications  Medication Dose Route Frequency Provider Last Rate Last Dose  . acetaminophen (TYLENOL) tablet 650 mg  650 mg Oral Q6H PRN Wallace, Andrew, DO   650 mg at 04/29/17 2228   Or  . acetaminophen (TYLENOL) suppository 650 mg  650 mg Rectal Q6H PRN Wallace, Andrew, DO      . albuterol (PROVENTIL) (2.5 MG/3ML) 0.083% nebulizer solution 2.5 mg  2.5 mg Nebulization Q4H PRN Wallace, Andrew, DO   2.5 mg at 04/28/17 2115  . Darbepoetin Alfa (ARANESP) injection 100 mcg  100 mcg Intravenous Q Wed-HD Molt, Bethany, DO   100 mcg at 05/01/17  1205  . darunavir-cobicistat (PREZCOBIX) 800-150 MG per tablet 1 tablet  1 tablet Oral Q breakfast Van Dam, Cornelius N, MD   1 tablet at 05/02/17 0859  . dextromethorphan-guaiFENesin (MUCINEX DM) 30-600 MG per 12 hr tablet 1 tablet  1 tablet Oral BID Patel, Vishal, MD   1 tablet at 05/02/17 0859  . diltiazem (CARDIZEM) tablet 60 mg  60 mg Oral Q6H Wallace, Andrew, DO   60 mg at 05/02/17 1413  . dolutegravir (TIVICAY) tablet 50 mg  50 mg Oral Daily Van Dam, Cornelius N, MD   50 mg at 05/02/17 0859  . feeding supplement (BOOST / RESOURCE BREEZE) liquid 1 Container  1 Container Oral TID BM Granfortuna, James M, MD   1 Container at 05/01/17 2000  . heparin injection 5,000 Units  5,000 Units Subcutaneous Q8H Wallace, Andrew, DO   5,000 Units at 05/02/17 1413  . lidocaine (PF) (XYLOCAINE) 1 % injection    PRN Hassell, Daniel, MD   10 mL at 04/29/17 1647  . MEDLINE mouth rinse  15 mL Mouth Rinse BID Granfortuna, James M, MD   15 mL at 05/02/17 0900  . multivitamin (RENA-VIT) tablet 1 tablet  1 tablet Oral QHS Dunham, Cynthia, MD   1 tablet at 05/01/17 2147  . ondansetron (ZOFRAN) tablet 4 mg  4 mg Oral Q6H PRN   Wallace, Andrew, DO   4 mg at 04/30/17 2202   Or  . ondansetron (ZOFRAN) injection 4 mg  4 mg Intravenous Q6H PRN Wallace, Andrew, DO   4 mg at 04/29/17 1400  . piperacillin-tazobactam (ZOSYN) IVPB 3.375 g  3.375 g Intravenous Q12H Deja, Erin N, RPH   Stopped at 05/02/17 0157  . rilpivirine (EDURANT) tablet 25 mg  25 mg Oral Q breakfast Van Dam, Cornelius N, MD   25 mg at 05/02/17 0859  . sevelamer carbonate (RENVELA) tablet 800 mg  800 mg Oral TID WC Molt, Bethany, DO   800 mg at 05/02/17 0859  . sodium chloride flush (NS) 0.9 % injection 3 mL  3 mL Intravenous Q12H Wallace, Andrew, DO   3 mL at 05/02/17 0900  . sulfamethoxazole-trimethoprim (BACTRIM,SEPTRA) 400-80 MG per tablet 1 tablet  1 tablet Oral Daily Van Dam, Cornelius N, MD   1 tablet at 05/01/17 1725  . zolpidem (AMBIEN) tablet 5 mg  5  mg Oral QHS PRN,MR X 1 Wallace, Andrew, DO   5 mg at 05/01/17 2148    Allergies  Allergen Reactions  . Tomato Other (See Comments)    Mouth sores    REVIEW OF SYSTEMS (negative unless checked):   Cardiac:  [] Chest pain or chest pressure? [x] Shortness of breath upon activity? [x] Shortness of breath when lying flat? [] Irregular heart rhythm?  Vascular:  [] Pain in calf, thigh, or hip brought on by walking? [] Pain in feet at night that wakes you up from your sleep? [] Blood clot in your veins? [] Leg swelling?  Pulmonary:  [] Oxygen at home? [x] Productive cough? [x] Wheezing?  Neurologic:  [] Sudden weakness in arms or legs? [] Sudden numbness in arms or legs? [] Sudden onset of difficult speaking or slurred speech? [] Temporary loss of vision in one eye? [] Problems with dizziness?  Gastrointestinal:  [] Blood in stool? [] Vomited blood?  Genitourinary:  [] Burning when urinating? [] Blood in urine?  Psychiatric:  [] Major depression  Hematologic:  [] Bleeding problems? [] Problems with blood clotting?  Dermatologic:  [] Rashes or ulcers?  Constitutional:  [] Fever or chills?  Ear/Nose/Throat:  [] Change in hearing? [] Nose bleeds? [] Sore throat?  Musculoskeletal:  [] Back pain? [] Joint pain? [] Muscle pain?   Physical Examination     Vitals:   05/02/17 1200 05/02/17 1230 05/02/17 1245 05/02/17 1248  BP: 128/76 125/77 126/81 (!) 125/35  Pulse: 81 78 82 72  Resp:   (!) 27   Temp:   98.2 F (36.8 C)   TempSrc:      SpO2:      Weight:   260 lb 9.3 oz (118.2 kg)   Height:       Body mass index is 34.38 kg/m.  General Alert, O x 3, Obese, NAD  Head [place]/AT,    Ear/Nose/ Throat Hearing grossly intact, nares without erythema or drainage, oropharynx without Erythema or Exudate, Mallampati score: 3,   Eyes PERRLA, EOMI,    Neck Supple, mid-line trachea,    Pulmonary Sym exp, good B air movt, upper airway sounds  Cardiac RRR, Nl  S1, S2, no Murmurs, No rubs, No S3,S4  Vascular Vessel Right Left  Radial Palpable Palpable  Brachial Palpable Palpable  Carotid Palpable, No Bruit Palpable, No Bruit  Aorta Not palpable N/A  Femoral Palpable Palpable  Popliteal Not palpable Not palpable  PT Palpable Palpable  DP   Palpable Palpable    Gastro- intestinal soft, non-distended, non-tender to palpation, No guarding or rebound, no HSM, no masses, no CVAT B, No palpable prominent aortic pulse due to pannus,     Musculo- skeletal M/S 5/5 throughout  , Extremities without ischemic changes  , No edema present, No visible varicosities , No Lipodermatosclerosis present  Neurologic Cranial nerves 2-12 intact , Pain and light touch intact in extremities , Motor exam as listed above  Psychiatric Judgement intact, Mood & affect appropriate for pt's clinical situation  Dermatologic See M/S exam for extremity exam, No rashes otherwise noted  Lymphatic  Palpable lymph nodes: None     Non-invasive Vascular Imaging   BUE Vein Mapping pending   Laboratory   CBC CBC Latest Ref Rng & Units 05/02/2017 05/01/2017 04/30/2017  WBC 4.0 - 10.5 K/uL 6.8 7.8 8.7  Hemoglobin 13.0 - 17.0 g/dL 7.8(L) 6.8(LL) 7.7(L)  Hematocrit 39.0 - 52.0 % 23.1(L) 20.1(L) 22.5(L)  Platelets 150 - 400 K/uL 135(L) 134(L) 123(L)    BMP BMP Latest Ref Rng & Units 05/02/2017 05/01/2017 04/30/2017  Glucose 65 - 99 mg/dL 102(H) 92 100(H)  BUN 6 - 20 mg/dL 34(H) 67(H) 100(H)  Creatinine 0.61 - 1.24 mg/dL 8.13(H) 11.20(H) 14.64(H)  Sodium 135 - 145 mmol/L 136 138 136  Potassium 3.5 - 5.1 mmol/L 3.8 3.7 3.8  Chloride 101 - 111 mmol/L 101 103 102  CO2 22 - 32 mmol/L 26 25 21(L)  Calcium 8.9 - 10.3 mg/dL 7.0(L) 7.0(L) 7.5(L)    Coagulation Lab Results  Component Value Date   INR 1.16 04/28/2017   No results found for: PTT  Lipids No results found for: CHOL, TRIG, HDL, CHOLHDL, VLDL, LDLCALC, LDLDIRECT  HIV viral count: 258,000   Radiology     04/29/17  CXR: Improving right basilar opacity consistent with improving pneumonia. Interval placement of right internal jugular catheter with distal tip in expected position of right atrium. No pneumothorax is noted.   Medical Decision Making   Jaaziah Waldorf is a 36 y.o. male who presents with end stage renal disease requiring hemodialysis, AIDS   Will need B arm vein mapping, obviously prefer to place any permanent access into L arm, but will use best vein available.  Will schedule pt for L arm AVF vs AVG placement, tunneled dialysis exchange vs placement for Monday, 05/06/17.  The patient is aware that the risks of access surgery include but are not limited to: bleeding, infection, steal syndrome, nerve damage, ischemic monomelic neuropathy, failure of access to mature, complications related to venous hypertension, and possible need for additional access procedures in the future. The patient is aware the risks of tunneled dialysis catheter placement include but are not limited to: bleeding, infection, central venous injury, pneumothorax, possible venous stenosis, possible malpositioning in the venous system, and possible infections related to long-term catheter presence.  The patient was aware of these risks and agreed to proceed.   Marcheta Horsey, MD, FACS [facility] Office: [phone] Pager: [phone]  05/02/2017, 2:16 PM   Addendum  Right Cephalic  Segment Diameter Depth Comment  1. Axilla 2.5 mm 13.6 mm   2. Mid upper arm 2.7 mm 6.9 mm   3. Above AC 3.1 mm 4.5 mm   4. In AC 4.6 mm 4.1 mm   5. Below AC 4.5 mm 4.3 mm Branch  6. Mid forearm 2.9 mm 7 mm Branch  7. Wrist 2.2 mm 4.9 mm    Right Basilic  Segment   Diameter Depth Comment  2. Mid upper arm 3.6 mm 14.2 mm   3. Above AC 2.7 mm 14 mm   4. In AC 2.6 mm 6.9 mm  branch  5. Below AC 2.5 mm 2.2 mm  branch  6. Mid forearm 3 mm 5.9 mm   7. Wrist 2.4 mm 2.9 mm    Left  Cephalic  Segment Diameter Depth Comment  1. Axilla 3.7 mm 14.5 mm   2. Mid upper arm 3.6 mm 12 mm branch  3. Above AC 1.6 mm 7.9 mm   4. In AC 1.7 mm 2.4 mm  branch  5. Below AC 1.2 mm 2.7 mm   6. Mid forearm 1.3 mm 2.6 mm   7. Wrist mm mm    Small segment of superficial vein thrombosis noted in the left cephalic vein at the axilla.  - R cephalic vein much better, so change to R RC vs BC AVF for Monday   Jadalee Westcott, MD, FACS [facility] Office: [phone] Pager: [phone]  05/02/2017, 4:44 PM    

## 2017-05-06 NOTE — Progress Notes (Addendum)
  Subjective: Patient seen and examined in the PACU. Still recovering from anesthesia and thinks he is a robot.  Objective: Vital signs in last 24 hours: Vitals:   05/06/17 0957 05/06/17 1000 05/06/17 1011 05/06/17 1015  BP:   120/90   Pulse:  91 83 78  Resp:  17 18 15   Temp: (!) 97.5 F (36.4 C)     TempSrc:      SpO2:  98% 96% 97%  Weight:      Height:       Weight change: -2.724 kg (-6 lb 0.1 oz)  Intake/Output Summary (Last 24 hours) at 05/06/17 1027 Last data filed at 05/06/17 1001  Gross per 24 hour  Intake             1150 ml  Output               25 ml  Net             1125 ml   BP 120/90   Pulse 78   Temp (!) 97.5 F (36.4 C)   Resp 15   Ht 6\' 1"  (1.854 m)   Wt 116.1 kg (255 lb 14.4 oz)   SpO2 97%   BMI 33.76 kg/m  General appearance: distracted Head: Normocephalic, without obvious abnormality, atraumatic Lungs: clear to auscultation bilaterally Heart: regular rate and rhythm, S1, S2 normal, no murmur, click, rub or gallop Abdomen: soft, non-tender; bowel sounds normal; no masses,  no organomegaly Pulses: 2+ and symmetric Skin: Skin color, texture, turgor normal. No rashes or lesions or Widespread xerosis present. Tunneled hemodialysis catheter present in right subcavian space.Maturing AV fisula present on right arm.   Assessment/Plan:  Acute on Chronic Renal Failure with Progression to ESRD  Patient was seen status post placement of right AV fistula and tunneled dialysis catheter. He is on a Tuesday, Thursday,Saturday dialysis schedule. We are monitoring his BUN,Creatinine and electrolytes closely. Continue Renvela and Aranesp per Nephrology.  If he discharges today, he can go to outpatient HD tomorrow.  If he stays, he will likely get HD then DC home afterwards.  Supraventricular Tachycardia  Continue 240mg  oral diltiazem   HIV Patient will continuetriple antiretroviral therapy( Prezcobix,Tivicay and Edurant). Due to this patient's hiatus from  anti-retrovirals we will monitor him closely for symptoms of immune reconstitution inflammatory syndrome. He is on Bactrim for PCP prophylaxis. Per ID his anti-retrovirals will be transitioned to [place] and Juluca upon discharge.  These medications have been given to his floor RN by ID pharmacy to provide him at discharge.  Disposition: Will discharge after post-operative monitoring period is over.    This is a Psychologist, occupationalMedical Student Note.  The care of the patient was discussed with Dr.Lex Linhares and the assessment and plan formulated with their assistance.  Please see their attached note for official documentation of the daily encounter.   LOS: 8 days   Avva, Junius FinnerKalyani L, Medical Student   Attestation for Student Documentation:  I personally was present and performed or re-performed the history, physical exam and medical decision-making activities of this service and have verified that the service and findings are accurately documented in the student's note.  Gwynn BurlyWallace, Temitope Griffing, DO 05/06/2017, 10:50 AM  05/06/2017, 10:27 AM

## 2017-05-06 NOTE — Discharge Summary (Signed)
Name: James Schwartz MRN: [id number] DOB: 17-Dec-1980 36 y.o. PCP: No primary care provider on file.  Date of Admission: 04/28/2017  9:26 AM Date of Discharge: 05/06/2017 Attending Physician: No att. providers found  Discharge Diagnosis: 1) Acute on Chronic Renal Failure with Progression to ESRD  2)HIV  3)Supraventricular Tachycardia and HTN 4) Sepsis secondary to Community Acquired Pneumonia of the Right Lower Lobe   5) anemia of CKD  Discharge Medications: Allergies as of 05/06/2017      Reactions   Tomato Other (See Comments)   Mouth sores      Medication List    STOP taking these medications   ADVIL PM PO   azithromycin 600 MG tablet Commonly known as:  ZITHROMAX   darunavir 800 MG tablet Commonly known as:  PREZISTA   NIFEdipine 30 MG 24 hr tablet Commonly known as:  PROCARDIA-XL/ADALAT-CC/NIFEDICAL-XL   rilpivirine 25 MG Tabs tablet Commonly known as:  EDURANT   ritonavir 100 MG Tabs tablet Commonly known as:  NORVIR   TIVICAY 50 MG tablet Generic drug:  dolutegravir     TAKE these medications   AMBULATORY NON FORMULARY MEDICATION Take 1 tablet by mouth daily with supper. Medication Name: Symtuza   Darbepoetin Alfa 100 MCG/0.5ML Sosy injection Commonly known as:  ARANESP Inject 0.5 mLs (100 mcg total) into the vein every Wednesday with hemodialysis.   diltiazem 240 MG 24 hr capsule Commonly known as:  CARDIZEM CD Take 1 capsule (240 mg total) by mouth daily.   Dolutegravir-Rilpivirine 50-25 MG Tabs Commonly known as:  JULUCA Take 1 tablet by mouth daily with supper.   multivitamin Tabs tablet Take 1 tablet by mouth at bedtime.   oxyCODONE-acetaminophen 5-325 MG tablet Commonly known as:  PERCOCET/ROXICET Take 1 tablet by mouth every 6 (six) hours as needed for moderate pain.   sevelamer carbonate 800 MG tablet Commonly known as:  RENVELA Take 1 tablet (800 mg total) by mouth 3 (three) times daily with meals.   sulfamethoxazole-trimethoprim  400-80 MG tablet Commonly known as:  BACTRIM,SEPTRA Take 1 tablet by mouth daily.       Disposition and follow-up:   James Schwartz [facility] St[facility].    1.  At the hospital follow up visit please address:  - Compliance with HIV anti-retrovirals and Dialysis, and kidney failure education with emphasis on lifestyle/dietary modifications.    - Compliance with Diltiazem for HR and BP - Follow up with ID and [facility]  2.  Labs / imaging needed at time of follow-up: Follow-up CXR in 6 weeks to document resolution of Pneumonia, Renal function panel, CBC  3.  Pending labs/ test needing follow-up: None  Follow-up Appointments: Follow-up Information    [employer]. Call.   Why:  go to this office early as it is usually busy Contact information: [address] [place] [place]          Hospital Course by problem list:    Acute on Chronic Kidney Failure with Progression to ESRD Patient presented with a 6 day history of nausea, vomiting and diarrhea accompanied by fever and tachycardia. CMP was remarkable for a creatinine of 15.1 and a BUN of 102. This was above the patients last known baseline creatinine of 3.9 in March 2017. Upon exam, patient recalled being diagnosed with chronic kidney disease but could not provide any other information.  Seeing that the patient was septic upon admission vigorous rehydration was attempted to remedy pre-renal causes of his  kidney failure. Despite hydration with 4.5L of NS our patient was oliguric. Renal Ultrasound was consistent with medical renal disease. Nephrology was consulted and recommended initiating dialysis.  Renal biopsy was not recommended.  It was felt that his ESRD was likely secondary to HTN +/- HIV disease. The patient received 4 rounds of dialysis and his Creatinine improved to 6.64 and BUN to 18. The patient was subsequently placed on a Tuesday, Thursday, Saturday  dialysis schedule and outpatient dialysis upon discharge was coordinated.  On 8/13 vascular surgery performed a right AV fistula creation and placed a tunneled dialysis catheter for use while the fistula matures.  He will need to follow up with [facility]VS as scheduled.   HIV  Patient reported that he has been HIV positive since 2002.  Prior to admission, he stated that he had been non-compliant with his anti-retroviral regimen for the last two months, however, a longer duration of non-compliance was suspected.  At admission his CD4+ count was 30 and HIV viral load was 258,000.  The infectious disease service was consulted and they initiated him on an anti-retroviral regimen which consisted of  Prezcobix,Tivicay and Edurant. Based on his CD4 count he was also place on PCP prophylaxis with Bactrim.  He was encouraged to follow-up with infectious disease upon discharge and get his wife started on PreP.  Upon discharge his antiretroviral regimen will be transitioned to [place].  He will follow up with ID.  Supraventricular Tachycardia and Hx of HTN During this hospitalization the patient experienced several runs of SVT which were precipitated by the placement of his IJ line for hemodialysis. He was converted into a prolonged sinus rhythm after being on a diltiazem drip for 48 hrs. He was then transitioned to 240mg  oral diltiazem daily.  His HR and BP were controlled with this dosage for 48 hrs prior to discharge   Sepsis Secondary to Community Acquired Pneumonia of the Right Lower Lobe  Patient presented to [facility] with fever, tachycardia, and a leukocytosis. CXR upon admission revealed pneumonia of the right middle lobe. The patient was treated with a 5 day course of zosyn. He made a full symptomatic recovery at discharge. We recommend a 6 week follow-up X-ray to document complete resolution of illness.   Anemia of Chronic Disease Patient noted to have a normocytic anemia, likely due to chronic disease from  HIV and CKD.  He received 1 blood transfusion with HD during his hospitalization for a hemoglobin less than 7.  His hemoglobin thereafter stabilized greater than 8.  He is being given Aranesp with hemodialysis.  Discharge Vitals:   BP 119/87   Pulse 86   Temp (!) 97.2 F (36.2 C)   Resp 15   Ht 6\' 1"  (1.854 m)   Wt 255 lb 14.4 oz (116.1 kg)   SpO2 99%   BMI 33.76 kg/m   Pertinent Labs, Studies, and Procedures:    Discharge Instructions: Discharge Instructions    Call MD for:  difficulty breathing, headache or visual disturbances    Complete by:  As directed    Call MD for:  extreme fatigue    Complete by:  As directed    Call MD for:  hives    Complete by:  As directed    Call MD for:  persistant dizziness or light-headedness    Complete by:  As directed    Call MD for:  persistant nausea and vomiting    Complete by:  As directed    Call  MD for:  redness, tenderness, or signs of infection (pain, swelling, redness, odor or green/yellow discharge around incision site)    Complete by:  As directed    Call MD for:  severe uncontrolled pain    Complete by:  As directed    Call MD for:  temperature >100.4    Complete by:  As directed    Diet - low sodium heart healthy    Complete by:  As directed    Discharge instructions    Complete by:  As directed    Mr. Kapur,  It was a pleasure taking care of you here in the hospital.  You are going home with a few new prescription: 1. Joluca and Symtuza are for your HIV disease.  Please continue to take this daily as instructed. 2. Bactrim is a medication you will take daily to prevent infection as your immune system recovers. 3. Diltiazem is for blood pressure and heart rate 4. Renvela and Renavit are for your kidney disease 5. Percocet is a temporary medication from your surgery.  Only take this if you need for pain.  Please go to your dialysis center as instructed starting tomorrow.  It is very important to go to your scheduled  dialysis sessions. Please follow up with Dr. Daiva Eves in the Infectious Disease Clinic. Please follow up with our Internal Medicine Center to make sure you are doing okay. Please follow up with Vascular Surgery for your fistula in your right arm.  Take Care.   Increase activity slowly    Complete by:  As directed       Signed: Gwynn Burly, DO 05/06/2017, 8:34 PM   Pager: [phone]

## 2017-05-06 NOTE — Transfer of Care (Signed)
Immediate Anesthesia Transfer of Care Note  Patient: James Schwartz  Procedure(s) Performed: Procedure(s): ARTERIOVENOUS (AV) FISTULA CREATION RIGHT ARM (Right) EXCHANGE OF A DIALYSIS CATHETER RIGHT (Right)  Patient Location: PACU  Anesthesia Type:General  Level of Consciousness: awake, alert , oriented and sedated  Airway & Oxygen Therapy: Patient Spontanous Breathing and Patient connected to nasal cannula oxygen  Post-op Assessment: Report given to RN, Post -op Vital signs reviewed and stable and Patient moving all extremities  Post vital signs: Reviewed and stable  Last Vitals:  Vitals:   05/06/17 0521 05/06/17 0957  BP: (!) 141/92   Pulse: 79   Resp: 18   Temp: 36.7 C (!) (P) 36.4 C  SpO2: 100%     Last Pain:  Vitals:   05/06/17 0521  TempSrc: Oral  PainSc:       Patients Stated Pain Goal: 0 (05/04/17 0800)  Complications: No apparent anesthesia complications

## 2017-05-06 NOTE — Telephone Encounter (Signed)
-----   Message from Sharee PimpleMarilyn K McChesney, RN sent at 05/06/2017 10:26 AM EDT ----- Regarding: 6-8 weeks   ----- Message ----- From: Fransisco Hertzhen, Brian L, MD Sent: 05/06/2017   9:52 AM To: [facility] Charge Pool  Rory PercyMaurice Cape [id number] [birth year]  PROCEDURE: 1. Conversion of right internal jugular vein temporary dialysis catheter to tunneled dialysis catheter  2. Right brachiocephalic arteriovenous fistula   Follow-up: 6-8 weeks

## 2017-05-06 NOTE — Telephone Encounter (Signed)
LVM on pts home # for appts and mailed lttr 05/06/17 bg

## 2017-05-06 NOTE — Progress Notes (Signed)
Called [phone] to give report, but no response.

## 2017-05-06 NOTE — Discharge Instructions (Addendum)
[employer], Adult A central line is a thin, flexible tube (catheter) that is put in your vein. It can be used to:  Take blood for lab tests.  Give you medicine.  Give you food and nutrients.  The procedure may vary among doctors and hospitals. Follow these instructions at home: Caring for the tube  Follow instructions from your doctor about: ? Flushing the tube with saline solution. ? Cleaning the tube and the area around it.  Only flush with clean (sterile) supplies. The supplies should be from your doctor, a pharmacy, or another place that your doctor recommends.  Before you flush the tube or clean the area around the tube: ? Wash your hands with soap and water. If you cannot use soap and water, use hand sanitizer. ? Clean the central line hub with rubbing alcohol. Caring for your skin  Keep the area where the tube was put in clean and dry.  Every day, and when changing the bandage, check the skin around the central line for: ? Redness, swelling, or pain. ? Fluid or blood. ? Warmth. ? Pus. ? A bad smell. General instructions  Keep the tube clamped, unless it is being used.  Keep your supplies in a clean, dry location.  If you or someone else accidentally pulls on the tube, make sure: ? The bandage (dressing) is okay. ? There is no bleeding. ? The tube has not been pulled out.  Do not use scissors or sharp objects near the tube.  Do not swim or let the tube soak in a tub.  Ask your doctor what activities are safe for you. Your doctor may tell you not to lift anything or move your arm too much.  Take over-the-counter and prescription medicines only as told by your doctor.  Change bandages as told by your doctor.  Keep your bandage dry. If a bandage gets wet, have it changed right away.  Keep all follow-up visits as told by your doctor. This is important. Throwing away supplies  Throw away any syringes in a trash (disposal) container that is only for sharp  items (sharps container). You can buy a sharps container from a pharmacy, or you can make one by using an empty hard plastic bottle with a cover.  Place any used bandages or infusion bags into a plastic bag. Throw that bag in the trash. Contact a doctor if:  You have any of these where the tube was put in: ? Redness, swelling, or pain. ? Fluid or blood. ? A warm feeling. ? Pus or a bad smell. Get help right away if:  You have: ? A fever. ? Chills. ? Trouble getting enough air (shortness of breath). ? Trouble breathing. ? Pain in your chest. ? Swelling in your neck, face, chest, or arm.  You are coughing.  You feel your heart beating fast or skipping beats.  You feel dizzy or you pass out (faint).  There are red lines coming from where the tube was put in.  The area where the tube was put in is bleeding and the bleeding will not stop.  Your tube is hard to flush.  You do not get a blood return from the tube.  The tube gets loose or comes out.  The tube has a hole or a tear.  The tube leaks. Summary  A central line is a thin, flexible tube (catheter) that is put in your vein. It can be used to take blood for lab tests or to  give you medicine.  Follow instructions from your doctor about flushing and cleaning the tube.  Keep the area where the tube was put in clean and dry.  Ask your doctor what activities are safe for you. This information is not intended to replace advice given to you by your health care provider. Make sure you discuss any questions you have with your health care provider. Document Released: 08/27/2012 Document Revised: 09/27/2016 Document Reviewed: 09/27/2016 Elsevier Interactive Patient Education  2017 [employer]   AV Fistula Placement Arteriovenous (AV) fistula placement is a surgical procedure to create a connection between a blood vessel that carries blood away from your heart (artery) and a blood vessel that returns blood to your heart  (vein). The connection is called a fistula. It is often made in the forearm or upper arm. You may need this procedure if you are getting hemodialysis treatments for kidney disease. An AV fistula makes your vein larger and stronger over several months. This makes the vein a safe and easy spot to insert the needles that are used for hemodialysis. Tell a health care provider about:  Any allergies you have.  All medicines you are taking, including vitamins, herbs, eye drops, creams, and over-the-counter medicines.  Any problems you or family members have had with anesthetic medicines.  Any blood disorders you have.  Any surgeries you have had.  Any medical conditions you have. What are the risks? Generally, this is a safe procedure. However, problems may occur, including:  Infection.  Blood clot (thrombosis).  Reduced blood flow (stenosis).  Weakening or ballooning out of the fistula (aneurysm).  Bleeding.  Allergic reactions to medicines.  Nerve damage.  Swelling near the fistula (lymphedema).  Weakening of your heart (congestive heart failure).  Failure of the procedure.  What happens before the procedure?  Imaging tests of your arm may be done to find the best place for the fistula.  Ask your health care provider about: ? Changing or stopping your regular medicines. This is especially important if you are taking diabetes medicines or blood thinners. ? Taking medicines such as aspirin and ibuprofen. These medicines can thin your blood. Do not take these medicines before your procedure if your health care provider instructs you not to.  Follow instructions from your health care provider about eating or drinking restrictions.  You may be given antibiotic medicine to help prevent infection.  Ask your health care provider how your surgical site will be marked or identified.  Plan to have someone take you home after the procedure. What happens during the procedure?  To  reduce your risk of infection: ? Your health care team will wash or sanitize their hands. ? Your skin will be washed with soap. ? Hair may be removed from the surgical area.  An IV tube will be started in one of your veins.  You will be given one or more of the following: ? A medicine to help you relax (sedative). ? A medicine to numb the area (local anesthetic). ? A medicine to make you fall asleep (general anesthetic). ? A medicine that is injected into an area of your body to numb everything below the injection site (regional anesthetic).  The fistula site will be cleaned with a germ-killing solution (antiseptic).  A cut (incision) will be made on the inner side of your arm.  A vein and an artery will be opened and connected with stitches (sutures).  The incision will be closed with sutures or clips.  A  bandage (dressing) will be placed over the area. The procedure may vary among health care providers and hospitals. What happens after the procedure?  Your blood pressure, heart rate, breathing rate, and blood oxygen level will be monitored often until the medicines you were given have worn off.  Your fistula site will be checked for bleeding or swelling.  You will be given pain medication as needed.  Do not drive for 24 hours if you received a sedative. This information is not intended to replace advice given to you by your health care provider. Make sure you discuss any questions you have with your health care provider. Document Released: 08/22/2015 Document Revised: 02/16/2016 Document Reviewed: 12/01/2014 Elsevier Interactive Patient Education  2017 [employer]

## 2017-05-06 NOTE — Anesthesia Postprocedure Evaluation (Signed)
Anesthesia Post Note  Patient: James Schwartz  Procedure(s) Performed: Procedure(s) (LRB): ARTERIOVENOUS (AV) FISTULA CREATION RIGHT ARM (Right) EXCHANGE OF A DIALYSIS CATHETER RIGHT (Right)     Patient location during evaluation: PACU Anesthesia Type: General Level of consciousness: awake and alert Pain management: pain level controlled Vital Signs Assessment: post-procedure vital signs reviewed and stable Respiratory status: spontaneous breathing, nonlabored ventilation and respiratory function stable Cardiovascular status: blood pressure returned to baseline and stable Postop Assessment: no signs of nausea or vomiting Anesthetic complications: no    Last Vitals:  Vitals:   05/06/17 1026 05/06/17 1028  BP: 119/87   Pulse: 86   Resp: 15   Temp:  (!) 36.2 C  SpO2: 99%     Last Pain:  Vitals:   05/06/17 0957  TempSrc:   PainSc: 0-No pain                 Nohemy Koop,W. EDMOND

## 2017-05-06 NOTE — Interval H&P Note (Signed)
History and Physical Interval Note:  05/06/2017 7:05 AM  James Schwartz  has presented today for surgery, with the diagnosis of end stage renal disease  The various methods of treatment have been discussed with the patient and family. After consideration of risks, benefits and other options for treatment, the patient has consented to  Procedure(s): ARTERIOVENOUS (AV) FISTULA CREATION RIGHT ARM (Right) EXCHANGE OF A DIALYSIS CATHETER RIGHT (Right) as a surgical intervention .  The patient's history has been reviewed, patient examined, no change in status, stable for surgery.  I have reviewed the patient's chart and labs.  Questions were answered to the patient's satisfaction.     Leonides SakeBrian Kosei Rhodes

## 2017-05-06 NOTE — Anesthesia Procedure Notes (Signed)
Procedure Name: LMA Insertion Date/Time: 05/06/2017 7:45 AM Performed by: Fransisca KaufmannMEYER, Estha Few E Pre-anesthesia Checklist: Patient identified, Emergency Drugs available, Suction available and Patient being monitored Patient Re-evaluated:Patient Re-evaluated prior to induction Oxygen Delivery Method: Circle System Utilized Preoxygenation: Pre-oxygenation with 100% oxygen Induction Type: IV induction Ventilation: Mask ventilation without difficulty LMA: LMA inserted LMA Size: 5.0 Number of attempts: 1 Airway Equipment and Method: Bite block Placement Confirmation: positive ETCO2 Tube secured with: Tape Dental Injury: Teeth and Oropharynx as per pre-operative assessment

## 2017-05-06 NOTE — Op Note (Signed)
OPERATIVE NOTE  PROCEDURE: 1. Conversion of right internal jugular vein temporary dialysis catheter to tunneled dialysis catheter  2. Right brachiocephalic arteriovenous fistula   PRE-OPERATIVE DIAGNOSIS: hemodialysis dependence  POST-OPERATIVE DIAGNOSIS: same as above  SURGEON: Leonides SakeBrian Chen, MD  ANESTHESIA: general  ESTIMATED BLOOD LOSS: 30 cc  FINDING(S): 1.  Tips of the catheter in the right atrium on fluoroscopy 2.  No obvious pneumothorax on fluoroscopy 3.  Palpable thrill and radial pulse at end of case  SPECIMEN(S):  none  INDICATIONS:   James Schwartz is a 36 y.o. male who presents with hemodialysis dependence.  The patient presents for conversion of temporary dialysis catheter to tunneled dialysis catheter placement.  The patient is aware the risks of dialysis catheter placement include but are not limited to: bleeding, infection, central venous injury, pneumothorax, possible venous stenosis, possible malpositioning in the venous system, and possible infections related to long-term catheter presence.  The patient was aware of these risks and agreed to proceed.   DESCRIPTION: After written full informed consent was obtained from the patient, the patient was taken back to the operating room.  Prior to induction, the patient was given IV antibiotics.  After obtaining adequate sedation, the patient was prepped and draped in the standard fashion for a chest or neck tunneled dialysis catheter placement.    I placed a J-wire into one port of the prior temporary dialysis catheter and advanced it into the right ventricle under fluroscopic guidance.  I removed the previous temporary dialysis catheter over the wire.  I then immediately dilated the skin tract and venotomy with the medium dilator.  This was then exchanged for the dilator-sheath which was advanced to its entire length.  The wire and dilator was removed and then the 23 cm Palindrome catheter placed through the sheath  under fluoroscopic guidance into the right atrium.  The peel away sheath was split and peeled away while hold the hub of the catheter in place.  I anesthesized the cannulation site, the catheter exit site, and tract for the subcutaneous tunnel with a total of 20 cc of 1% lidocaine without epinephrine and 0.5% Marcaine without epinephrine.  I then made extended further the neck incision with a 11-blade and then placed an exit incision on the chest.  I dissected from the exit site to the cannulation site with a metal tunneler.   I clamped the catheter with plastic clamp and then transected the back end of this catheter.  This was loaded onto the tunneler.  I delivered the back end of the catheter docked onto the tunneler through the subcutaneous tunnel.  The back end of the catheter was clamped and transected a second time, revealing the two lumens of this catheter.  The hub of the catheter was loaded over the catheter and then the ports were docked onto these two lumens.  The catheter hub was then clicked into place.  Each port was tested by aspirating and flushing.  No resistance was noted.    Each port was then thoroughly flushed with heparinized saline.  The catheter was secured in placed with two interrupted stitches of 3-0 Nylon tied to the catheter.  The neck incision was closed with a U-stitch of 4-0 Monocryl.  The neck and chest incision were cleaned and sterile bandages applied.  Each port was then loaded with concentrated heparin (1000 Units/mL) at the manufacturer recommended volumes to each port.  Sterile caps were applied to each port.    On completion fluoroscopy,  the tips of the catheter were in the right atrium, and there was no evidence of pneumothorax.  At this point, the drapes were removed and the table repositioned for an access procedure.  The patient was then reprepped and redraped in the standard fashion for a right arm access procedure.  I turned my attention first to identifying the  patient's cephalic vein and brachial artery.  Using SonoSite guidance, the location of these vessels were marked out on the skin.     At this point, I injected local anesthetic to obtain a field block of the antecubitum.  In total, I injected about 10 mL of 1% lidocaine without epinephrine and 0.5% Marcaine without epinephrine.  I made a transverse incision at the level of the antecubitum and dissected through the subcutaneous tissue and fascia to gain exposure of the brachial artery.  This was noted to be 4 mm in diameter externally.  This was dissected out proximally and distally and controlled with vessel loops .  I then dissected out the cephalic vein.  This was noted to be 4 mm in diameter externally.  The distal segment of the vein was ligated with a  2-0 silk, and the vein was transected.  The proximal segment was interrogated with serial dilators.  The vein accepted up to a 4 mm dilator without any difficulty.  I then instilled the heparinized saline into the vein and clamped it.  At this point, I reset my exposure of the brachial artery and placed the artery under tension proximally and distally.  I made an arteriotomy with a #11 blade, and then I extended the arteriotomy with a Potts scissor.  I injected heparinized saline proximal and distal to this arteriotomy.  The vein was then sewn to the artery in an end-to-side configuration with a running stitch of 7-0 Prolene.  Prior to completing this anastomosis, I allowed the vein and artery to backbleed.  There was no evidence of clot from any vessels.  I completed the anastomosis in the usual fashion and then released all vessel loops and clamps.    There was a palpable thrill in the venous outflow, and there was a palpable radial pulse.  At this point, I irrigated out the surgical wound.  There was no further active bleeding.  The subcutaneous tissue was reapproximated with a running stitch of 3-0 Vicryl.  The skin was then reapproximated with a running  subcuticular stitch of 4-0 Vicryl.  The skin was then cleaned, dried, and reinforced with Dermabond.  The patient tolerated this procedure well.    COMPLICATIONS: none  CONDITION: stable   Leonides Sake, MD, [facility] [place] Office: [phone] Pager: [phone]  05/06/2017, 8:25 AM

## 2017-05-07 ENCOUNTER — Encounter (HOSPITAL_COMMUNITY): Payer: Self-pay | Admitting: Vascular Surgery

## 2017-05-08 ENCOUNTER — Ambulatory Visit: Payer: Medicaid Other | Admitting: Pharmacist Clinician (PhC)/ Clinical Pharmacy Specialist

## 2017-05-08 MED ORDER — DARUN-COBIC-EMTRICIT-TENOFAF 800-150-200-10 MG PO TABS: 1.0000 | ORAL_TABLET | Freq: Every day | ORAL | 3 refills | Status: DC

## 2017-05-09 NOTE — Progress Notes (Signed)
No show

## 2017-05-10 LAB — GENOSURE INTEGRASE HIV EDI

## 2017-05-10 LAB — REFLEX TO GENOSURE(R) MG

## 2017-05-10 LAB — HIV-1 RNA ULTRAQUANT REFLEX TO GENTYP+
HIV-1 RNA BY PCR: 138000 {copies}/mL
HIV-1 RNA QUANT, LOG: 5.14 {Log_copies}/mL

## 2017-05-13 ENCOUNTER — Telehealth: Payer: Self-pay | Admitting: *Deleted

## 2017-05-13 ENCOUNTER — Ambulatory Visit: Payer: Medicaid Other

## 2017-05-13 NOTE — Telephone Encounter (Signed)
James Schwartz is there a way to continue getting him SYMTUZA until covered through Medicaid?  If not then Lets send #30 prezcobix and Juluca (I am assuming the Juluca IS covered?)  We gave him SYMTUZA because James Schwartz was able to get through pt assistance

## 2017-05-13 NOTE — Telephone Encounter (Signed)
[facility] called and advised the medication sent in for the patient is not on the approved and we will need to prescribe something else. Advised will have pharmacy here send something else in for him as soon as possible.  [phone]

## 2017-05-15 ENCOUNTER — Ambulatory Visit: Payer: Medicaid Other | Admitting: Infectious Disease

## 2017-05-17 ENCOUNTER — Encounter: Payer: Self-pay | Admitting: Infectious Diseases

## 2017-05-17 ENCOUNTER — Telehealth: Payer: Self-pay | Admitting: *Deleted

## 2017-05-17 ENCOUNTER — Ambulatory Visit (INDEPENDENT_AMBULATORY_CARE_PROVIDER_SITE_OTHER): Payer: Medicaid Other | Admitting: Infectious Diseases

## 2017-05-17 VITALS — BP 153/107 | HR 119 | Temp 97.5°F | Ht 73.0 in | Wt 265.0 lb

## 2017-05-17 DIAGNOSIS — I10 Essential (primary) hypertension: Secondary | ICD-10-CM

## 2017-05-17 DIAGNOSIS — B20 Human immunodeficiency virus [HIV] disease: Secondary | ICD-10-CM

## 2017-05-17 DIAGNOSIS — N186 End stage renal disease: Secondary | ICD-10-CM | POA: Diagnosis not present

## 2017-05-17 LAB — CBC
HEMATOCRIT: 30.2 % — AB (ref 38.5–50.0)
Hemoglobin: 9.8 g/dL — ABNORMAL LOW (ref 13.2–17.1)
MCH: 30.3 pg (ref 27.0–33.0)
MCHC: 32.5 g/dL (ref 32.0–36.0)
MCV: 93.5 fL (ref 80.0–100.0)
MPV: 9.6 fL (ref 7.5–12.5)
PLATELETS: 265 10*3/uL (ref 140–400)
RBC: 3.23 MIL/uL — AB (ref 4.20–5.80)
RDW: 17.1 % — ABNORMAL HIGH (ref 11.0–15.0)
WBC: 6.7 10*3/uL (ref 3.8–10.8)

## 2017-05-17 LAB — COMPREHENSIVE METABOLIC PANEL
ALT: 11 U/L (ref 9–46)
AST: 17 U/L (ref 10–40)
Albumin: 3 g/dL — ABNORMAL LOW (ref 3.6–5.1)
Alkaline Phosphatase: 134 U/L — ABNORMAL HIGH (ref 40–115)
BILIRUBIN TOTAL: 0.4 mg/dL (ref 0.2–1.2)
BUN: 32 mg/dL — ABNORMAL HIGH (ref 7–25)
CO2: 27 mmol/L (ref 20–32)
Calcium: 8.6 mg/dL (ref 8.6–10.3)
Chloride: 97 mmol/L — ABNORMAL LOW (ref 98–110)
Creat: 7.81 mg/dL — ABNORMAL HIGH (ref 0.60–1.35)
GLUCOSE: 80 mg/dL (ref 65–99)
POTASSIUM: 3.6 mmol/L (ref 3.5–5.3)
Sodium: 136 mmol/L (ref 135–146)
Total Protein: 7.8 g/dL (ref 6.1–8.1)

## 2017-05-17 MED ORDER — METOPROLOL SUCCINATE ER 50 MG PO TB24: 50.0000 mg | ORAL_TABLET | Freq: Every day | ORAL | 3 refills | Status: AC

## 2017-05-17 MED ORDER — DOLUTEGRAVIR-RILPIVIRINE 50-25 MG PO TABS: 1.0000 | ORAL_TABLET | Freq: Every day | ORAL | 5 refills | Status: DC

## 2017-05-17 MED ORDER — DARUN-COBIC-EMTRICIT-TENOFAF 800-150-200-10 MG PO TABS: 1.0000 | ORAL_TABLET | Freq: Every day | ORAL | 5 refills | Status: DC

## 2017-05-17 MED ORDER — SULFAMETHOXAZOLE-TRIMETHOPRIM 400-80 MG PO TABS: 1.0000 | ORAL_TABLET | Freq: Every day | ORAL | 5 refills | Status: DC

## 2017-05-17 NOTE — Patient Instructions (Signed)
Thank you for allowing [place] to be a part of your care. Today we spoke about your diagnosis and the following are the necessary next steps you need to take:  1) please take all your medications daily with food 2) pick up prescriptions from [facility] 3) 1 month follow up at our clinic 4) talk to your partner regarding appointment for preventative medication 5) you have been prescribed a new blood pressure medication called metoprolol. Please take this medication daily along with your usual medication.

## 2017-05-17 NOTE — Telephone Encounter (Signed)
Referral received for assistance with medication management/adherence. Spoke with the patient who stated he would like for me to become part of his healthcare team because he could use some assistance at this time. James Schwartz is already enrolled in [facility]) and will have a visit with me on Monday

## 2017-05-17 NOTE — Progress Notes (Signed)
Brief Narrative: James Schwartz is a heterosexual male. Originally diagnosed with HIV in 2002. History of OIs: none known  PCP - Patient, No Pcp Per     Patient Active Problem List   Diagnosis Date Noted  . Dialysis patient ([place])   . AKI (acute kidney injury) ([place])   . AIDS (acquired immune deficiency syndrome) ([place])   . ESRD (end stage renal disease) ([place])   . Nausea vomiting and diarrhea   . Acute renal failure (ARF) ([place]) 04/28/2017  . Hypertension 04/28/2017  . HIV disease ([place]) 04/28/2017  . Asthma 04/28/2017  . Community acquired pneumonia of right lower lobe of lung ([place]) 04/28/2017  . Abdominal pain 04/28/2017  . Diarrhea 04/28/2017  . Anemia 04/28/2017    Patient's Medications  New Prescriptions   No medications on file  Previous Medications   DARBEPOETIN ALFA (ARANESP) 100 MCG/0.5ML SOSY INJECTION    Inject 0.5 mLs (100 mcg total) into the vein every Wednesday with hemodialysis.   DARUNAVIR-COBICISCTAT-EMTRICITABINE-TENOFOVIR ALAFENAMIDE (SYMTUZA) 800-150-200-10 MG TABS    Take 1 tablet by mouth daily with breakfast.   DILTIAZEM (CARDIZEM CD) 240 MG 24 HR CAPSULE    Take 1 capsule (240 mg total) by mouth daily.   DOLUTEGRAVIR-RILPIVIRINE (JULUCA) 50-25 MG TABS    Take 1 tablet by mouth daily with supper.   MULTIVITAMIN (RENA-VIT) TABS TABLET    Take 1 tablet by mouth at bedtime.   OXYCODONE-ACETAMINOPHEN (PERCOCET/ROXICET) 5-325 MG TABLET    Take 1 tablet by mouth every 6 (six) hours as needed for moderate pain.   SEVELAMER CARBONATE (RENVELA) 800 MG TABLET    Take 1 tablet (800 mg total) by mouth 3 (three) times daily with meals.   SULFAMETHOXAZOLE-TRIMETHOPRIM (BACTRIM,SEPTRA) 400-80 MG TABLET    Take 1 tablet by mouth daily.  Modified Medications   No medications on file  Discontinued Medications   No medications on file    Subjective: James Schwartz presents to clinic today for routine follow up care for his HIV infection. He was hospitalized  recently for SOB and found to have creatinine > 15 acutely. During hospitalization permanent HD cath was tunneled into R chest and AVF was place in RUA.   HPI:  James Schwartz is a 36 y.o m with hiv, svt, essential htn, and anemia of chronic disease presents today for hospital follow up. He was admitted to [facility] on 04/28/17 with a 6 day history of nausea, vomiting, diarrhea, fever, and tachycardia. He was managed for acute on chronic kidney failure that has been progressing to ESRD, HIV, and SVT.   The patient states that he has been diagnosed with HIV since 2002 and has been followed at [facility] and another hospital in [place] for this. However, he has not been non-compliant with his medication as he had a hard time with accepting this diagnosis and didn't realize the implications till his hospitalization. His most recent CD4 count=30 and viral load=258,000 (05/01/17). He was [facility] during his [facility] and was prescribed symtuza and jaluca for his HIV and bactim for OI prophylaxis.  He states that he has been adherent with his HIV medication, but has not gotten the bactrim yet. The patient is Hep B negative per hospital records.   The patient states that he is currently only sexually active with his fiance for whom he moved from [place] for. They have been using condoms regularly. He stated that his fiance has been tested  for HIV recently at her pcp's office and has been negative. His fiance is interested in PreP.   He states that he is doing well, denies any diarrhea, new rashes, abdominal pain,, sob, or chest pain, sweats, chills.   Follow up with vascular surgeon Bactrim  Follow up for fiance   Review of Systems: ROS as above  Past Medical History:  Diagnosis Date  . Asthma   . HIV (human immunodeficiency virus infection) ([place])     Social History  Substance Use Topics  . Smoking status: Never Smoker  . Smokeless tobacco: Never Used  . Alcohol use No     No family history on file.  Allergies  Allergen Reactions  . Tomato Other (See Comments)    Mouth sores    Objective:  Vitals:   05/17/17 1001  BP: (!) 153/107  Pulse: (!) 119  Temp: (!) 97.5 F (36.4 C)     Physical Exam Physical Exam  Constitutional: He appears well-developed and well-nourished. No distress.  HENT:  Head: Normocephalic and atraumatic.  Cardiovascular: Regular rhythm and normal heart sounds.  Tachycardia present.   Pulmonary/Chest: Effort normal and breath sounds normal. No respiratory distress. He has no wheezes.  Abdominal: Soft. Bowel sounds are normal. He exhibits no distension. There is no tenderness.  Skin: No rash noted.  Psychiatric: He has a normal mood and affect. His behavior is normal. Judgment and thought content normal.    Lab Results Lab Results  Component Value Date   WBC 6.3 05/05/2017   HGB 8.8 (L) 05/05/2017   HCT 27.1 (L) 05/05/2017   MCV 86.6 05/05/2017   PLT 238 05/05/2017    Lab Results  Component Value Date   CREATININE 7.82 (H) 05/06/2017   BUN 18 05/06/2017   NA 135 05/06/2017   K 3.8 05/06/2017   CL 99 (L) 05/06/2017   CO2 27 05/06/2017    Lab Results  Component Value Date   ALT 14 (L) 04/28/2017   AST 33 04/28/2017   ALKPHOS 124 04/28/2017   BILITOT 0.6 04/28/2017    No results found for: CHOL, HDL, LDLCALC, LDLDIRECT, TRIG, CHOLHDL HIV 1 RNA Quant (copies/mL)  Date Value  04/28/2017 258,000   CD4 T Cell Abs (/uL)  Date Value  04/28/2017 30 (L)   No results found for: HAV Lab Results  Component Value Date   HEPBSAG Negative 04/30/2017   HEPBSAB Reactive 04/30/2017   No results found for: HCVAB No results found for: CHLAMYDIAWP, N No results found for: GCPROBEAPT No results found for: QUANTGOLD No results found for: RPR    Problem List Items Addressed This Visit      Other   AIDS (acquired immune deficiency syndrome) ([place]) - Primary     Assessment and Plan  HIV Non-compliant HIV  patient who presented post recent hospitalization. He has relocation from [place] to [place] recently and would like to establish care with [facility]. He was seen by Dr. Tommy Medal in the hospital and placed on symtuza and juluca. He was also placed on Bactrium, but was not started on MAC prophylaxis due to the possibility of his CD4 rising quickly.   -Continue symtuza and juluca -Re-ordered bactrim and instructed to use daily -CMET and cbc w/o diff ordered -RPR ordered -Quantiferon gold ordered -Given condoms -Follow up in 1 month at which time will recheck vl and cd4 level -Pending Gen[employer] testing results   HD -Follow up with vascular surgery regarding tunneled cvc  HTN Patient's bp  at this visit was 153/107. Patient states that he has not been taking his diltiazem as prescription was sent to [place]. He was prescribed metoprolol as it is on ADAP formulary.  -Started metoprolol 67m qd -Re-assess bp at next visit   VLars Mage MD Internal Medicine PGY1 Pager:[phone] 05/17/2017, 11:13 AM     ADDENDUM:   Patient reported that he sprained his right ankle. Rolled ankle about 2 days ago.  Swelling present on exam. Tenderness with palpation and pain with walking to right outer lateral aspect just distal to malleolus. Negative squeeze test. He has no altered gait and no history of inability to walk.  Wrapped his ankle with ACE wrap, instructed on rest, ice, compression and elevation. Advised to apply ice 179mutes at a time and remove ace wrap at bed time. May reapply as needed during waking hours.   He will likely need higher dose of Metoprolol, however did not want to start much higher for him as he has not been on BB therapy in the past. Diltiazem not on ADAP. Once medicaid approved could switch back to Dilt XR 240 mg QD as his vitals at discharge were within range. I asked him to write down his blood pressures and heart rate when he goes to dialysis so we can keep record so we can increase  Metoprolol for effect.   Changed perm-a-cath dressing today under sterile conditions as dressing was partially removed.   MiKarn Picklerill help facilitate Medicaid application and obtaining Bactrim today. Counseled heavily on his medication adherence today multiple times.   Agree with the above outlined plan. Will place referral for PCP as he has many chronic underlying conditions for management. He has follow up with vascular surgeon in ~1 month.   StJanene MadeiraMSN, NP-C [facility]: [phone]/24/18 1:16 PM

## 2017-05-17 NOTE — Progress Notes (Deleted)
Brief Narrative: Trevelle Rosenfield is a {homosexual}{heterosexual}{bisexual} @GENDER @. Originally diagnosed with HIV in ***. History of OIs: ***  PCP - Patient, No Pcp Per  Cardiologist - *** Pulmonologist - ***   Patient Active Problem List   Diagnosis Date Noted  . Dialysis patient (HCC)   . AKI (acute kidney injury) (HCC)   . AIDS (acquired immune deficiency syndrome) (HCC)   . ESRD (end stage renal disease) (HCC)   . Nausea vomiting and diarrhea   . Acute renal failure (ARF) (HCC) 04/28/2017  . Hypertension 04/28/2017  . HIV disease (HCC) 04/28/2017  . Asthma 04/28/2017  . Community acquired pneumonia of right lower lobe of lung (HCC) 04/28/2017  . Abdominal pain 04/28/2017  . Diarrhea 04/28/2017  . Anemia 04/28/2017    Patient's Medications  New Prescriptions   No medications on file  Previous Medications   DARBEPOETIN ALFA (ARANESP) 100 MCG/0.5ML SOSY INJECTION    Inject 0.5 mLs (100 mcg total) into the vein every Wednesday with hemodialysis.   DARUNAVIR-COBICISCTAT-EMTRICITABINE-TENOFOVIR ALAFENAMIDE (SYMTUZA) 800-150-200-10 MG TABS    Take 1 tablet by mouth daily with breakfast.   DILTIAZEM (CARDIZEM CD) 240 MG 24 HR CAPSULE    Take 1 capsule (240 mg total) by mouth daily.   DOLUTEGRAVIR-RILPIVIRINE (JULUCA) 50-25 MG TABS    Take 1 tablet by mouth daily with supper.   MULTIVITAMIN (RENA-VIT) TABS TABLET    Take 1 tablet by mouth at bedtime.   OXYCODONE-ACETAMINOPHEN (PERCOCET/ROXICET) 5-325 MG TABLET    Take 1 tablet by mouth every 6 (six) hours as needed for moderate pain.   SEVELAMER CARBONATE (RENVELA) 800 MG TABLET    Take 1 tablet (800 mg total) by mouth 3 (three) times daily with meals.   SULFAMETHOXAZOLE-TRIMETHOPRIM (BACTRIM,SEPTRA) 400-80 MG TABLET    Take 1 tablet by mouth daily.  Modified Medications   No medications on file  Discontinued Medications   No medications on file    Subjective: Howard Bruer presents to clinic today for routine  follow up care for his HIV infection.  HPI:  HIV =  Currently on a regimen of *** and is tolerating it well. Over the last 30 days he has missed *** doses and is taking it *** time of day. No concerns regarding medications or HIV disease. Symptoms associated with his HIV today include ***. Last viral load and CD4 counts have been ***. he is currently not in a relationship and {has/has not} engaged in sexual activity in last 6 months. When {he/she} is sexually active {he/she} discloses HIV status to partners and {is/is not} using condoms.    {He/She} is seeing a dentist for care twice a year and has no dental complaints, lesions, xerostomia or pain.    Health Maintenance = Andersen Pinkus reports occasional exercise and receives annual preventative care through {his/her} PCP and is up to date on all recommended screenings and vaccinations. {He/She} denies cigarette use, excessive alcohol use and other substance abuse.   Review of Systems: ROS  Past Medical History:  Diagnosis Date  . Asthma   . HIV (human immunodeficiency virus infection) (HCC)     Social History  Substance Use Topics  . Smoking status: Never Smoker  . Smokeless tobacco: Never Used  . Alcohol use No    No family history on file.  Allergies  Allergen Reactions  . Tomato Other (See Comments)    Mouth sores    Objective:  There were no vitals filed for this visit.  There is no height or weight on file to calculate BMI.  Physical Exam  Lab Results Lab Results  Component Value Date   WBC 6.3 05/05/2017   HGB 8.8 (L) 05/05/2017   HCT 27.1 (L) 05/05/2017   MCV 86.6 05/05/2017   PLT 238 05/05/2017    Lab Results  Component Value Date   CREATININE 7.82 (H) 05/06/2017   BUN 18 05/06/2017   NA 135 05/06/2017   K 3.8 05/06/2017   CL 99 (L) 05/06/2017   CO2 27 05/06/2017    Lab Results  Component Value Date   ALT 14 (L) 04/28/2017   AST 33 04/28/2017   ALKPHOS 124 04/28/2017   BILITOT 0.6 04/28/2017     No results found for: CHOL, HDL, LDLCALC, LDLDIRECT, TRIG, CHOLHDL HIV 1 RNA Quant (copies/mL)  Date Value  04/28/2017 258,000   CD4 T Cell Abs (/uL)  Date Value  04/28/2017 30 (L)   No results found for: HAV Lab Results  Component Value Date   HEPBSAG Negative 04/30/2017   HEPBSAB Reactive 04/30/2017   No results found for: HCVAB No results found for: CHLAMYDIAWP, N No results found for: GCPROBEAPT No results found for: QUANTGOLD No results found for: RPR    Problem List Items Addressed This Visit      Other   AIDS (acquired immune deficiency syndrome) (HCC) - Primary      Health maintenance = vaccination counseling provided today. Recommend ***.   Smoking cessation = encouraged to quit smoking. Not ready at this time. Will continue to assess for readiness at future visits.    Rexene Alberts, MSN, NP-C [facility]   05/17/17 9:59 AM

## 2017-05-18 LAB — RPR TITER

## 2017-05-18 LAB — QUANTIFERON TB GOLD ASSAY (BLOOD)
Interferon Gamma Release Assay: NEGATIVE
MITOGEN-NIL SO: 0.5 [IU]/mL
QUANTIFERON TB AG MINUS NIL: 0.03 [IU]/mL
Quantiferon Nil Value: 0.09 IU/mL

## 2017-05-18 LAB — RPR: RPR Ser Ql: REACTIVE — AB

## 2017-05-20 ENCOUNTER — Encounter: Payer: Self-pay | Admitting: Infectious Disease

## 2017-05-20 ENCOUNTER — Telehealth: Payer: Self-pay | Admitting: *Deleted

## 2017-05-20 ENCOUNTER — Ambulatory Visit: Payer: Self-pay | Admitting: *Deleted

## 2017-05-20 VITALS — BP 180/124 | HR 108

## 2017-05-20 DIAGNOSIS — N186 End stage renal disease: Secondary | ICD-10-CM

## 2017-05-20 DIAGNOSIS — I10 Essential (primary) hypertension: Secondary | ICD-10-CM

## 2017-05-20 DIAGNOSIS — B20 Human immunodeficiency virus [HIV] disease: Secondary | ICD-10-CM

## 2017-05-20 LAB — FLUORESCENT TREPONEMAL AB(FTA)-IGG-BLD: FLUORESCENT TREPONEMAL ABS: NONREACTIVE

## 2017-05-20 NOTE — Telephone Encounter (Signed)
Contacted [employer] and requested that the patient's medications be transferred from [facility] per his request. [facility] [employer] asked if the patient now has insurance coverage. Advised that the patient has applied for ADAP along with Medicaid so coverage should be effective soon. [employer] stated that will contact [facility] and request a transfer of the patient's profile. [facility] phone number given at this time

## 2017-05-22 ENCOUNTER — Telehealth: Payer: Self-pay | Admitting: *Deleted

## 2017-05-22 NOTE — Telephone Encounter (Addendum)
Order received on 05/17/17 by Rexene Alberts NP/Dr Daiva Eves to evaluate patient for [facility]).  Patient was evaluated on  for [facility]NS. Patient was consented to care at this time.   Frequency / Duration of CBHCN visits: Effective 05/20/17 60mo1, 48mo1, 26mo1, 57mo1 4PRN's for complications with disease process/progression, medication changes or concerns   [facility]N will assess for learning needs related to diagnosis and treatment regimen, provide education as needed, fill pill box if needed, address any barriers which may be preventing medication compliance, and communicating with care team including physician and case manager.   Individualized Plan Of Care Certification Period of 05/20/17 to 08/18/17  a. Type of service(s) and care to be delivered: RN Case Management  b. Frequency and duration of service: Effective 05/20/17 72mo1, 36mo1, 51mo1, 70mo1 4PRN's for complications with disease process/progression, medication changes or concerns . Visits/Contact may be conducted telephonically or in person to  best  suit the patient.  c. Activity restrictions: Pt may be up as tolerated and can safely ambulate without the need for a assistive device   d. Safety Measures: Standard Precautions/Infection Control   e. Service Objectives and Goals: Service Objectives are to assist the pt with HIV medication regimen adherence and staying in care with the Infectious Disease Clinic by identifying barriers to care. RN will address the barriers that are identified by the patient.  Patient centered goals is to get and stay in care on adherent to his medications  f. Equipment required: No additional equipment needs at this time   g. Functional Limitations: None Noted. Right arm fistula and right chest have dialysis cath  h. Rehabilitation potential: Guarded   i. Diet and Nutritional Needs:Low phosphorus Diet/ fluid restriction  j. Medications and treatments: Medications have been  reconciled and reviewed and are a part of EPIC electronic file   k. Specific therapies if needed: RN   l. Pertinent diagnoses: HIV disease,  Hx of medication NonCompliance, ESRD with diaysis  m. Expected outcome: Guarded   Initial Assessment Points to Consider for Care  (Dialysis on Tues, Thurs and Sat )   Points are not all inclusive to services and educated provided but supports the patient's Individualized Plan of Care.  . Is Home safe for visits?/ Are all firearms or weapons secure? Home appears safe for visits/ no firearms or weapons noted to be unsecured  . Insurance Coverage: MCD pending/ Emergency ADAP processed on 05/17/17  . 1st HIV Diagnosis: 2002 in [place]  . Mode of HIV Transmission: thought to be through Heterosexual Contact  . Functional Status: Able to perform ADL's independently  . Current Housing/Needs:Social Support/System:Living in a 2-3 bedroom apartment with his fiance, children (3, 49, 10 and 11) and Mother in [place]. Mother-in-law is not aware of HIV status. Patient refers to his HIV meds as IV meds so his 35yr old dtr will not repeat the information either. Strongest support is his Elissa Lovett) who is aware of his status and has previously tested negative for HIV.  . Culture/Religion/Spirituality: Not discussed at this time   . Educational Background:Completed some of [employer]. He was in IT consultant during school. Becomes easily frustrated when challenges to care arrive  . Legal Issues: None current in [place]  . Access/Utilization of Community Resource: Currently  in care with [facility])  . Mental Health Concerns/Diagnosis: no signs of depression noted or previously diagnosed in our EPIC system  . Alcohol and/or Drug Use: Tobacco use but  denies alcohol and substance abuse  . Risk and Knowledge of HIV and Reduction in Transmission: Current risk is patient is knowledgeable about HIV but has not been able to adhere/comply  with his medication regimen since learning about his HIV status in 2002. He has a basic understanding of HIV and understands that condom use for his is the best way to reduce the transmission of HIV  . Nutritional Needs: He struggles to comply with the fluid restriction set for him due to his ESRD.   During today's visit condoms were given to his fiance and we discussed that I can get more as needed. RN explained my role with the Health Care team as a support for him and his family. Explained that my focus is to assist with removing barriers to medication adherence. Explained that I can come to him, meet at the clinic, work remotely for him, and have contact by text or call. Patient and fiance verbalized understanding. Condoms and lubrication given during this visit. Education provided on how adhering to his fluid restriction decreases the fluid that he retains making it easier to breath between dialysis days. We reviewed his current medications in the home and discussed medication adherence. Everlean AlstromMaurice demonstrated his ability to fill the pillbox himself. RN instructed him that his continued elevated BP is causing more damage to his health. Everlean AlstromMaurice stated he has been taking his BP medications since he got them on Friday. He would like to have prescriptions transferred to [facility] since they already have deliver set up for his finance's mother. RN agreed to call and request the transfer of his profile. Planned next visit for Friday to reassess his BP and medications adherence.

## 2017-05-22 NOTE — Patient Instructions (Signed)
RN met with client and caregiver for assessment. RN reviewed Transport planner, [place], Home Safety Management Information Booklet. Home Fire Safety Assessment, Fall Risk Assessment and Suicide Risk Assessment was performed. RN also discussed information on a Living Will, Advanced Directives, and [place]. RN and Client/Designated Party educated/reviewed/signed Client Agreement and Consent for Service form along with Patient Rights and Responsibilities statement. RN developed patient specific and centered care plan. RN provided contact information and reviewed how to receive emergency help after hours for schedule changes, billing questions, reporting of safety issues, falls, concerns or any needs/questions. Standard Precaution and Infection control along with interventions to correct or prevent high risk behaviors instructed to the patient. Client/Caregiver reports understanding and agreement with the above

## 2017-05-22 NOTE — Progress Notes (Signed)
Initial Assessment Points to Consider for Care  (Dialysis on Tues, Thurs and Sat )   Points are not all inclusive to services and educated provided but supports the patient's Individualized Plan of Care.  . Is Home safe for visits?/ Are all firearms or weapons secure? Home appears safe for visits/ no firearms or weapons noted to be unsecured  . Insurance Coverage: MCD pending/ Emergency ADAP processed on 05/17/17  . 1st HIV Diagnosis: 2002 in [place]  . Mode of HIV Transmission: thought to be through Heterosexual Contact  . Functional Status: Able to perform ADL's independently  . Current Housing/Needs:Social Support/System:Living in a 2-3 bedroom apartment with his fiance, children (3, 76, 10 and 11) and Mother in [place]. Mother-in-law is not aware of HIV status. Patient refers to his HIV meds as IV meds so his 35yr old dtr will not repeat the information either. Strongest support is his Elissa Lovett) who is aware of his status and has previously tested negative for HIV.  . Culture/Religion/Spirituality: Not discussed at this time   . Educational Background:Completed some of [employer]. He was in IT consultant during school. Becomes easily frustrated when challenges to care arrive  . Legal Issues: None current in [place]  . Access/Utilization of Community Resource: Currently  in care with Bridge Counselor(Mitch)  . Mental Health Concerns/Diagnosis: no signs of depression noted or previously diagnosed in our EPIC system  . Alcohol and/or Drug Use: Tobacco use but denies alcohol and substance abuse  . Risk and Knowledge of HIV and Reduction in Transmission: Current risk is patient is knowledgeable about HIV but has not been able to adhere/comply with his medication regimen since learning about his HIV status in 2002. He has a basic understanding of HIV and understands that condom use for his is the best way to reduce the transmission of HIV  . Nutritional Needs: He  struggles to comply with the fluid restriction set for him due to his ESRD.   During today's visit condoms were given to his fiance and we discussed that I can get more as needed. RN explained my role with the Health Care team as a support for him and his family. Explained that my focus is to assist with removing barriers to medication adherence. Explained that I can come to him, meet at the clinic, work remotely for him, and have contact by text or call. Patient and fiance verbalized understanding. Condoms and lubrication given during this visit. Education provided on how adhering to his fluid restriction decreases the fluid that he retains making it easier to breath between dialysis days. We reviewed his current medications in the home and discussed medication adherence. Elysha demonstrated his ability to fill the pillbox himself. RN instructed him that his continued elevated BP is causing more damage to his health. Obrian stated he has been taking his BP medications since he got them on Friday. He would like to have prescriptions transferred to [facility] since they already have deliver set up for his finance's mother. RN agreed to call and request the transfer of his profile. Planned next visit for Friday to reassess his BP and medications adherence.   Order received on 05/17/17 by Rexene Alberts NP/Dr Daiva Eves to evaluate patient for [facility]).  Patient was evaluated on  for [facility]. Patient was consented to care at this time.   Frequency / Duration of [facility] visits: Effective 05/20/17 80mo1, 71mo1, 78mo1, 23mo1 4PRN's for complications with disease  process/progression, medication changes or concerns   [facility] will assess for learning needs related to diagnosis and treatment regimen, provide education as needed, fill pill box if needed, address any barriers which may be preventing medication compliance, and communicating with care team including physician  and case manager.   Individualized Plan Of Care Certification Period of 05/20/17 to 08/18/17  a. Type of service(s) and care to be delivered: RN Case Management  b. Frequency and duration of service: Effective 05/20/17 27mo1, 52mo1, 72mo1, 16mo1 4PRN's for complications with disease process/progression, medication changes or concerns . Visits/Contact may be conducted telephonically or in person to  best  suit the patient.  c. Activity restrictions: Pt may be up as tolerated and can safely ambulate without the need for a assistive device   d. Safety Measures: Standard Precautions/Infection Control   e. Service Objectives and Goals: Service Objectives are to assist the pt with HIV medication regimen adherence and staying in care with the Infectious Disease Clinic by identifying barriers to care. RN will address the barriers that are identified by the patient.  Patient centered goals is to get and stay in care on adherent to his medications  f. Equipment required: No additional equipment needs at this time   g. Functional Limitations: None Noted. Right arm fistula and right chest have dialysis cath  h. Rehabilitation potential: Guarded   i. Diet and Nutritional Needs:Low phosphorus Diet/ fluid restriction  j. Medications and treatments: Medications have been reconciled and reviewed and are a part of EPIC electronic file   k. Specific therapies if needed: RN   l. Pertinent diagnoses: HIV disease,  Hx of medication NonCompliance, ESRD with diaysis  m. Expected outcome: Guarded

## 2017-06-05 ENCOUNTER — Telehealth: Payer: Self-pay | Admitting: *Deleted

## 2017-06-05 NOTE — Telephone Encounter (Signed)
Left a message stating the reason for my call(wanted to confirm that he has plenty of medication as well) asked for a return call or text. Number left at this time.  Marthann SchillerMitch has also made attempts to reach the patient without success. Marthann SchillerMitch has sent a letter to the home as an attempt to connect with the patient

## 2017-06-12 ENCOUNTER — Ambulatory Visit: Payer: Self-pay | Admitting: *Deleted

## 2017-06-12 VITALS — BP 142/102

## 2017-06-12 DIAGNOSIS — I10 Essential (primary) hypertension: Secondary | ICD-10-CM

## 2017-06-12 DIAGNOSIS — B20 Human immunodeficiency virus [HIV] disease: Secondary | ICD-10-CM

## 2017-06-12 DIAGNOSIS — N186 End stage renal disease: Secondary | ICD-10-CM

## 2017-06-17 ENCOUNTER — Ambulatory Visit: Payer: Medicaid Other | Admitting: Infectious Diseases

## 2017-06-18 NOTE — Progress Notes (Signed)
    Postoperative Access Visit   History of Present Illness   James Schwartz is a 36 y.o. year old male who presents for postoperative follow-up for: right brachiocephalic arteriovenous fistula (Date: 05/06/17).  The patient's wounds are healed.  The patient notes no steal symptoms.  The patient is able to complete their activities of daily living.  The patient's current symptoms are: none.   Physical Examination   Vitals:   06/21/17 1344  BP: (!) 143/99  Pulse: 87  Resp: 18  Temp: 98.5 F (36.9 C)  TempSrc: Oral  SpO2: 98%  Weight: 260 lb (117.9 kg)  Height: 6' (1.829 m)   Body mass index is 35.26 kg/m.  right arm Incision is healed, skin feels warm, hand grip is 5/5, sensation in digits is intact, palpable thrill, bruit can be auscultated, palpable radial pulse    Medical Decision Making   James Schwartz is a 36 y.o. year old male who presents s/p right brachiocephalic arteriovenous fistula   The patient's access is ready for use.  The patient's tunneled dialysis catheter can be removed after two successful cannulations and completed dialysis treatments.  Thank you for allowing [place] to participate in this patient's care.   Leonides Sake, MD, FACS [facility] Office: [phone] Pager: [phone]

## 2017-06-21 ENCOUNTER — Encounter: Payer: Self-pay | Admitting: Infectious Diseases

## 2017-06-21 ENCOUNTER — Ambulatory Visit (INDEPENDENT_AMBULATORY_CARE_PROVIDER_SITE_OTHER): Payer: Medicaid Other | Admitting: Infectious Diseases

## 2017-06-21 ENCOUNTER — Encounter: Payer: Self-pay | Admitting: Vascular Surgery

## 2017-06-21 ENCOUNTER — Ambulatory Visit (INDEPENDENT_AMBULATORY_CARE_PROVIDER_SITE_OTHER): Payer: Self-pay | Admitting: Vascular Surgery

## 2017-06-21 ENCOUNTER — Telehealth: Payer: Self-pay | Admitting: *Deleted

## 2017-06-21 VITALS — BP 142/100 | HR 93 | Temp 98.2°F | Ht 72.0 in | Wt 260.5 lb

## 2017-06-21 VITALS — BP 143/99 | HR 87 | Temp 98.5°F | Resp 18 | Ht 72.0 in | Wt 260.0 lb

## 2017-06-21 DIAGNOSIS — G63 Polyneuropathy in diseases classified elsewhere: Secondary | ICD-10-CM | POA: Insufficient documentation

## 2017-06-21 DIAGNOSIS — B20 Human immunodeficiency virus [HIV] disease: Secondary | ICD-10-CM | POA: Diagnosis present

## 2017-06-21 DIAGNOSIS — I10 Essential (primary) hypertension: Secondary | ICD-10-CM | POA: Diagnosis not present

## 2017-06-21 DIAGNOSIS — N186 End stage renal disease: Secondary | ICD-10-CM

## 2017-06-21 MED ORDER — GABAPENTIN 100 MG PO CAPS: 100.0000 mg | ORAL_CAPSULE | Freq: Two times a day (BID) | ORAL | 3 refills | Status: DC

## 2017-06-21 MED ORDER — METOPROLOL SUCCINATE ER 25 MG PO TB24: 25.0000 mg | ORAL_TABLET | Freq: Every day | ORAL | 5 refills | Status: DC

## 2017-06-21 NOTE — Assessment & Plan Note (Signed)
Will reassess VL today. Too late for CD4 count - will continue Bactrim and recheck this for him at the next appt. Will have him continue the Symtuza + Juluca. I believe his rash on his face may be related to immune response to resuming ART. It is not bothersome to him and does not require treatment at this time. Should it persist may need to re-consider cause. Reassured them today.   Provided condoms. I have given them information re: PrEP initiation and a way to set up appointment with our pharmacy team. She reports she has insurance.   His Medicaid has been approved. Needs primary care provider for multiple co-morbidities.   RTC in 6-8 weeks with labs prior to if possible.

## 2017-06-21 NOTE — Assessment & Plan Note (Signed)
Does not sound to be musculoskeletal in how he describes the pain and I assume this is neuropathy. Will trial gabapentin 100 mg BID. With his ESRD need to proceed cautiously with upward titration of dose.

## 2017-06-21 NOTE — Telephone Encounter (Signed)
Contacted [employer] and verified that the have the patient's current medicaid number. Spoke with Sam who confirmed the mcd number and processed the patient's medications through MCD without any trouble. Sam stated he will get the medications ready and delivered to the patient's home

## 2017-06-21 NOTE — Progress Notes (Signed)
Brief Narrative: James Schwartz is here from [place] ([facility] patient since 03/2017) for care of his HIV infection. Originally diagnosed with HIV in 2002. History of OIs: none known.   PCP - Patient, No Pcp Per    Patient Active Problem List   Diagnosis Date Noted  . Dialysis patient (HCC)   . AKI (acute kidney injury) (HCC)   . AIDS (acquired immune deficiency syndrome) (HCC)   . ESRD (end stage renal disease) (HCC)   . Nausea vomiting and diarrhea   . Acute renal failure (ARF) (HCC) 04/28/2017  . Hypertension 04/28/2017  . HIV disease (HCC) 04/28/2017  . Asthma 04/28/2017  . Community acquired pneumonia of right lower lobe of lung (HCC) 04/28/2017  . Abdominal pain 04/28/2017  . Diarrhea 04/28/2017  . Anemia 04/28/2017    Patient's Medications  New Prescriptions   No medications on file  Previous Medications   DARBEPOETIN ALFA (ARANESP) 100 MCG/0.5ML SOSY INJECTION    Inject 0.5 mLs (100 mcg total) into the vein every Wednesday with hemodialysis.   DARUNAVIR-COBICISCTAT-EMTRICITABINE-TENOFOVIR ALAFENAMIDE (SYMTUZA) 800-150-200-10 MG TABS    Take 1 tablet by mouth daily with breakfast.   DILTIAZEM (CARDIZEM CD) 240 MG 24 HR CAPSULE    Take 1 capsule (240 mg total) by mouth daily.   DOLUTEGRAVIR-RILPIVIRINE (JULUCA) 50-25 MG TABS    Take 1 tablet by mouth daily with supper.   MULTIVITAMIN (RENA-VIT) TABS TABLET    Take 1 tablet by mouth at bedtime.   OXYCODONE-ACETAMINOPHEN (PERCOCET/ROXICET) 5-325 MG TABLET    Take 1 tablet by mouth every 6 (six) hours as needed for moderate pain.   SEVELAMER CARBONATE (RENVELA) 800 MG TABLET    Take 1 tablet (800 mg total) by mouth 3 (three) times daily with meals.   SULFAMETHOXAZOLE-TRIMETHOPRIM (BACTRIM,SEPTRA) 400-80 MG TABLET    Take 1 tablet by mouth daily.  Modified Medications   No medications on file  Discontinued Medications   No medications on file    Subjective: [name] presents to clinic today for routine follow up  care for his HIV infection.  His wife accompanies him to visit today. Mitch our bridge counselor and Ambre our home health nurse are assisting in his care. His most recent CD4 count=30 and viral load=258,000 (05/01/17).   HPI:  HIV = doing well on Symtuza + Juluca regimen. Has missed 3 doses of medication regimen since last visit. Forgets sometimes was the cause of his misses. He has not noticed any side effects from these medications and finds them to be tolerable. Endorses no complaints today suggestive of associated opportunistic infection or advancing HIV disease such as fevers, night sweats, weight loss, anorexia, cough, SOB, nausea, vomiting, diarrhea, headache, sensory changes, lymphadenopathy or oral thrush. Does have a new rash to his face - non pruritic and described to be 'pimple-like.' Started 1-2 weeks ago. He and his wife are currently sexually active. They use condoms every time. She tells me she gets tested frequently. He is uncertain as if he is taking bactrim but tells me he is consistently taking 4 medications total.   Foot Pain = worsening since last visit. Tells me he does not walk much but he has slowed down even more lately d/t the pain. Describes it to be more on the bottom of his feet up through ankle.   Hypertension / Palpitations = improved heart racing/palpitations symptom since adding Metoprolol. No headaches or visual disturbances. Denies side effects a/w medication today.   Health  Maintenance = reports flu vaccine recently.   Review of Systems: ROS as above and otherwise negative in 10-point review.   Past Medical History:  Diagnosis Date  . Asthma   . HIV (human immunodeficiency virus infection) (HCC)     Social History  Substance Use Topics  . Smoking status: Never Smoker  . Smokeless tobacco: Never Used  . Alcohol use No    No family history on file.  Allergies  Allergen Reactions  . Tomato Other (See Comments)    Mouth sores    Objective:  Vitals:    06/21/17 1124  BP: (!) 142/100  Pulse: 93  Temp: 98.2 F (36.8 C)     Physical Exam Physical Exam  Constitutional: He appears well-developed and well-nourished. No distress.  HENT:  Head: Normocephalic and atraumatic.  Cardiovascular: Regular rhythm and normal heart sounds.  Tachycardia present.   Pulmonary/Chest: Effort normal and breath sounds normal. No respiratory distress. He has no wheezes.  Abdominal: Soft. Bowel sounds are normal. He exhibits no distension. There is no tenderness.  Skin: No rash noted.  Psychiatric: He has a normal mood and affect. His behavior is normal. Judgment and thought content normal.  HD Cath Right Chest - dressing peeling off (reinforced)   Lab Results Lab Results  Component Value Date   WBC 6.3 05/05/2017   HGB 8.8 (L) 05/05/2017   HCT 27.1 (L) 05/05/2017   MCV 86.6 05/05/2017   PLT 238 05/05/2017    Lab Results  Component Value Date   CREATININE 7.82 (H) 05/06/2017   BUN 18 05/06/2017   NA 135 05/06/2017   K 3.8 05/06/2017   CL 99 (L) 05/06/2017   CO2 27 05/06/2017    Lab Results  Component Value Date   ALT 14 (L) 04/28/2017   AST 33 04/28/2017   ALKPHOS 124 04/28/2017   BILITOT 0.6 04/28/2017    No results found for: CHOL, HDL, LDLCALC, LDLDIRECT, TRIG, CHOLHDL HIV 1 RNA Quant (copies/mL)  Date Value  04/28/2017 258,000   CD4 T Cell Abs (/uL)  Date Value  04/28/2017 30 (L)   No results found for: HAV Lab Results  Component Value Date   HEPBSAG Negative 04/30/2017   HEPBSAB Reactive 04/30/2017    Problem List Items Addressed This Visit      Cardiovascular and Mediastinum   Hypertension    Not at target but improved and HR improved. Will increase to 75 mg QD. Explained he needs both 50 mg and 25 mg pills to achieve dose.       Relevant Medications   metoprolol succinate (TOPROL XL) 25 MG 24 hr tablet     Nervous and Auditory   Polyneuropathy associated with underlying disease (HCC)    Does not sound to be  musculoskeletal in how he describes the pain and I assume this is neuropathy. Will trial gabapentin 100 mg BID. With his ESRD need to proceed cautiously with upward titration of dose.       Relevant Medications   gabapentin (NEURONTIN) 100 MG capsule     Other   AIDS (acquired immune deficiency syndrome) (HCC) - Primary    Will reassess VL today. Too late for CD4 count - will continue Bactrim and recheck this for him at the next appt. Will have him continue the Symtuza + Juluca. I believe his rash on his face may be related to immune response to resuming ART. It is not bothersome to him and does not require treatment at this time.  Should it persist may need to re-consider cause. Reassured them today.   Provided condoms. I have given them information re: PrEP initiation and a way to set up appointment with our pharmacy team. She reports she has insurance.   His Medicaid has been approved. Needs primary care provider for multiple co-morbidities.   RTC in 6-8 weeks with labs prior to if possible.        Other Visit Diagnoses    HIV (human immunodeficiency virus infection) (HCC)       Relevant Orders   HIV 1 RNA quant-no reflex-bld   T-helper cell (CD4)- ([facility] clinic only)      Rexene Alberts, MSN, NP-C [facility] Pager: [phone]  06/21/17 3:57 PM

## 2017-06-21 NOTE — Patient Instructions (Signed)
Keep taking your symtuza and juluca and bactrim every day  Will check labs today  We will increase your Metoprolol to 75 mg every day - you will need to take 2 pills for this dose. You should receive another dose from [facility].   Will call you about the neurontin dose after I discuss with your nephrology team. This will hopefully help the burning in your feet.   Please come back in 6 weeks.

## 2017-06-21 NOTE — Assessment & Plan Note (Signed)
Not at target but improved and HR improved. Will increase to 75 mg QD. Explained he needs both 50 mg and 25 mg pills to achieve dose.

## 2017-06-21 NOTE — Assessment & Plan Note (Signed)
Will reassess VL today as it is too late for CD4 count. Will check CD4 at next office visit in 6 weeks. Seems to be tolerating medications well and advised to continue Symtuza, Juluca and Bactrim for OI proph. I provided him with condoms today and counseled he and

## 2017-06-24 LAB — HIV-1 RNA QUANT-NO REFLEX-BLD
HIV 1 RNA QUANT: 12000 {copies}/mL — AB
HIV-1 RNA Quant, Log: 4.08 Log copies/mL — ABNORMAL HIGH

## 2017-07-23 NOTE — Progress Notes (Signed)
Rn did a drive by to the home and was able to meet James Schwartz.  He still does not have his MCD and has been instructed to call [employer]([place] assistance program[place] and have them send information to [facility] stating he is not longer receiving benefits through them. [place] [employer] does all the [place], Food stamp and housing, etc for people  living with HIV.  James Schwartz lives with family members that are not aware of his HIV status so I told him that maybe he could call [employer] during his time with Mitch(Bridge Counselor)   I called [employer] today find out exactly what number James Schwartz needs to call. The representative was able to pull him up in the system and said he should call [employer]'s service line at [phone] and asked to speak with his Case Manager and then request the needed information.  She also gave me a number of [phone] option 3 if needed. Information was relayed to Marthann SchillerMitch who will be taking the patient outside the home on Friday and the call can be placed away from the family

## 2017-08-05 ENCOUNTER — Encounter: Payer: Self-pay | Admitting: Infectious Diseases

## 2017-08-05 ENCOUNTER — Ambulatory Visit (INDEPENDENT_AMBULATORY_CARE_PROVIDER_SITE_OTHER): Payer: Medicaid Other | Admitting: Infectious Diseases

## 2017-08-05 VITALS — BP 162/107 | HR 91 | Temp 97.8°F

## 2017-08-05 DIAGNOSIS — I10 Essential (primary) hypertension: Secondary | ICD-10-CM

## 2017-08-05 DIAGNOSIS — B20 Human immunodeficiency virus [HIV] disease: Secondary | ICD-10-CM | POA: Diagnosis present

## 2017-08-05 NOTE — Assessment & Plan Note (Addendum)
Has missed several doses in setting of added environmental/personal stressors with his children and living situation. No results from genosure testing back in August have resulted. Will try to track down these. Will go ahead and try to recollect this today. Hopefully he will have enough VL to perform. Check CD4 as well.   For now I have instructed him to continue Juluca + Symtuza and Bactrim for OI prophylaxis. If virus sensitive on re-collect today may drop Juluca and maintain on only Symtuza. I worry about his adherence.

## 2017-08-05 NOTE — Assessment & Plan Note (Addendum)
Will continue to monitor - with HD do not want to overshoot management if he is acutely angered/stressed today. Advised to watch BP during HD sessions and let me know what they tend to run. May need to add another agent for him. Needs PCP for ongoing management with other chronic health conditions.

## 2017-08-05 NOTE — Progress Notes (Signed)
Brief Narrative: James Schwartz is here from [place] ([facility] patient since 03/2017) for care of his HIV infection. Originally diagnosed with HIV in 2002.   HIV Mutations: have been unable at obtaining records from previous clinic as he was inconsistently in care.   History of OIs: none known.  PCP - Patient, No Pcp Per    Patient Active Problem List   Diagnosis Date Noted  . Polyneuropathy associated with underlying disease (HCC) 06/21/2017  . Dialysis patient (HCC)   . AIDS (acquired immune deficiency syndrome) (HCC)   . ESRD (end stage renal disease) (HCC)   . Nausea vomiting and diarrhea   . Hypertension 04/28/2017  . HIV disease (HCC) 04/28/2017  . Asthma 04/28/2017  . Abdominal pain 04/28/2017  . Diarrhea 04/28/2017  . Anemia 04/28/2017      Medication List        Accurate as of 08/05/17  5:13 PM. Always use your most recent med list.          Darbepoetin Alfa 100 MCG/0.5ML Sosy injection Commonly known as:  ARANESP Inject 0.5 mLs (100 mcg total) into the vein every Wednesday with hemodialysis.   Darunavir-Cobicisctat-Emtricitabine-Tenofovir Alafenamide 800-150-200-10 MG Tabs Commonly known as:  SYMTUZA Take 1 tablet by mouth daily with breakfast.   Dolutegravir-Rilpivirine 50-25 MG Tabs Commonly known as:  JULUCA Take 1 tablet by mouth daily with supper.   gabapentin 100 MG capsule Commonly known as:  NEURONTIN Take 1 capsule (100 mg total) by mouth 2 (two) times daily.   * metoprolol succinate 50 MG 24 hr tablet Commonly known as:  TOPROL XL Take 1 tablet (50 mg total) by mouth daily. Take with or immediately following a meal.   * metoprolol succinate 25 MG 24 hr tablet Commonly known as:  TOPROL XL Take 1 tablet (25 mg total) by mouth daily. TAKE WITH 50 MG tablet.   multivitamin Tabs tablet Take 1 tablet by mouth at bedtime.   sevelamer carbonate 800 MG tablet Commonly known as:  RENVELA Take 1 tablet (800 mg total) by mouth 3 (three) times  daily with meals.   sulfamethoxazole-trimethoprim 400-80 MG tablet Commonly known as:  BACTRIM,SEPTRA Take 1 tablet by mouth daily.      * This list has 2 medication(s) that are the same as other medications prescribed for you. Read the directions carefully, and ask your doctor or other care provider to review them with you.          Subjective: James PercyMaurice Bradish presents to clinic today for routine follow up care for his HIV infection.  HPI:  HIV = doing well on Symtuza + Juluca regimen. Has missed maybe 3 doses of medications since last in office per his account due to increased stress at home. Mitch our bridge counselor and Ambre our home health nurse are assisting in his care. Endorses no complaints today suggestive of associated opportunistic infection or advancing HIV disease such as fevers, night sweats, weight loss, anorexia, cough, SOB, nausea, vomiting, diarrhea, headache, sensory changes, lymphadenopathy or oral thrush.   Hypertension / Palpitations = improved heart racing/palpitations symptom since adding Metoprolol. No headaches or visual disturbances. Denies side effects a/w medication today. Elevated acutely in setting of stress. He is very upset and worried about his home life regarding current relationship status and where he may go next to live.   Health Maintenance = reports flu vaccine recently. Continues with dialysis sessions. Does not wish to have other vaccines today and in hurry  to get home.   Review of Systems: Review of Systems  Constitutional: Negative for chills and fever.  HENT: Negative for tinnitus.   Eyes: Negative for blurred vision and photophobia.  Respiratory: Negative for cough and sputum production.   Cardiovascular: Negative for chest pain.  Gastrointestinal: Negative for diarrhea, nausea and vomiting.  Genitourinary:       Anuric   Skin: Negative for rash.  Neurological: Negative for headaches.  Psychiatric/Behavioral: Positive for depression.  The patient is nervous/anxious.     Past Medical History:  Diagnosis Date  . Asthma   . ESRD (end stage renal disease) (HCC)   . HIV (human immunodeficiency virus infection) (HCC)   . Hypertension 04/28/2017    Social History   Tobacco Use  . Smoking status: Current Every Day Smoker    Packs/day: 0.25    Types: Cigarettes    Start date: 09/24/2002  . Smokeless tobacco: Never Used  Substance Use Topics  . Alcohol use: No  . Drug use: No    Family History  Problem Relation Age of Onset  . Diabetes Mother     Allergies  Allergen Reactions  . Tomato Other (See Comments)    Mouth sores    Objective:  Vitals:   08/05/17 1223  BP: (!) 162/107  Pulse: 91  Temp: 97.8 F (36.6 C)  TempSrc: Oral   There is no height or weight on file to calculate BMI.  Physical Exam  Constitutional: He is oriented to person, place, and time and well-developed, well-nourished, and in no distress.  HENT:  Mouth/Throat: Oropharynx is clear and moist. No oral lesions. Normal dentition. No dental caries.  Eyes: No scleral icterus.  Cardiovascular: Normal rate, regular rhythm and normal heart sounds.  Pulmonary/Chest: Effort normal and breath sounds normal.  Abdominal: Soft. He exhibits no distension. There is no tenderness.  Musculoskeletal: Normal range of motion.  Lymphadenopathy:    He has no cervical adenopathy.  Neurological: He is alert and oriented to person, place, and time.  Skin: Skin is warm and dry. No rash noted.  Psychiatric: His mood appears anxious. He is agitated. He exhibits disordered thought content.    Lab Results Lab Results  Component Value Date   WBC 6.7 05/17/2017   HGB 9.8 (L) 05/17/2017   HCT 30.2 (L) 05/17/2017   MCV 93.5 05/17/2017   PLT 265 05/17/2017    Lab Results  Component Value Date   CREATININE 7.81 (H) 05/17/2017   BUN 32 (H) 05/17/2017   NA 136 05/17/2017   K 3.6 05/17/2017   CL 97 (L) 05/17/2017   CO2 27 05/17/2017    Lab Results    Component Value Date   ALT 11 05/17/2017   AST 17 05/17/2017   ALKPHOS 134 (H) 05/17/2017   BILITOT 0.4 05/17/2017    No results found for: CHOL, HDL, LDLCALC, LDLDIRECT, TRIG, CHOLHDL HIV 1 RNA Quant (copies/mL)  Date Value  06/21/2017 12,000 (H)  04/28/2017 258,000   CD4 T Cell Abs (/uL)  Date Value  04/28/2017 30 (L)   No results found for: HAV Lab Results  Component Value Date   HEPBSAG Negative 04/30/2017   HEPBSAB Reactive 04/30/2017   No results found for: HCVAB No results found for: CHLAMYDIAWP, N No results found for: GCPROBEAPT Lab Results  Component Value Date   QUANTGOLD NEGATIVE 05/17/2017   No results found for: RPR    Problem List Items Addressed This Visit      Cardiovascular and Mediastinum  Hypertension    Will continue to monitor - with HD do not want to overshoot management if he is acutely angered/stressed today. Advised to watch BP during HD sessions and let me know what they tend to run. May need to add another agent for him. Needs PCP for ongoing management with other chronic health conditions.         Other   AIDS (acquired immune deficiency syndrome) (HCC)    Has missed several doses in setting of added environmental/personal stressors with his children and living situation. No results from genosure testing back in August have resulted. Will try to track down these. Will go ahead and try to recollect this today. Hopefully he will have enough VL to perform. Check CD4 as well.   For now I have instructed him to continue Juluca + Symtuza and Bactrim for OI prophylaxis. If virus sensitive on re-collect today may drop Juluca and maintain on only Symtuza. I worry about his adherence.        Other Visit Diagnoses    HIV (human immunodeficiency virus infection) (HCC)    -  Primary   Relevant Orders   T-helper cell (CD4)- ([facility] clinic only)   HIV RNA, RTPCR W/R GT (RTI, PI,INT)     Rexene AlbertsStephanie Dixon, MSN, NP-C [facility] Pager: [phone]  08/05/17 3:43 pm

## 2017-08-05 NOTE — Patient Instructions (Signed)
Continue your Juluca and Prezcobix AND your bactrim today.   Will check your labs today and see you back in 2 months.

## 2017-08-06 LAB — T-HELPER CELL (CD4) - (RCID CLINIC ONLY)
CD4 % Helper T Cell: 8 % — ABNORMAL LOW (ref 33–55)
CD4 T CELL ABS: 155 /uL — AB (ref 400–2700)

## 2017-08-14 LAB — HIV-1 INTEGRASE GENOTYPE

## 2017-08-14 LAB — HIV RNA, RTPCR W/R GT (RTI, PI,INT)
HIV 1 RNA QUANT: 6870 {copies}/mL — AB
HIV-1 RNA Quant, Log: 3.84 Log copies/mL — ABNORMAL HIGH

## 2017-08-14 LAB — HIV-1 GENOTYPE: HIV-1 GENOTYPE: DETECTED — AB

## 2017-08-19 ENCOUNTER — Telehealth: Payer: Self-pay | Admitting: *Deleted

## 2017-08-19 ENCOUNTER — Telehealth: Payer: Self-pay | Admitting: Infectious Disease

## 2017-08-19 NOTE — Telephone Encounter (Signed)
Genotypes from [facility] from August showing no R (though he was not on ARV)

## 2017-08-26 ENCOUNTER — Encounter: Payer: Self-pay | Admitting: Licensed Clinical Social Worker

## 2017-09-27 NOTE — Telephone Encounter (Signed)
Frequency / Duration of [facility] visits: Effective 08/19/17 18mo1, 38mo2, 44mo1 4PRN's for complications with disease process/progression, medication changes or concerns   [facility] will assess for learning needs related to diagnosis and treatment regimen, provide education as needed, fill pill box if needed, address any barriers which may be preventing medication compliance, and communicating with care team including physician and case manager.   Individualized Plan Of Care Certification Period of 08/19/17 to 11/17/17  a. Type of service(s) and care to be delivered: RN Case Management  b. Frequency and duration of service: Effective 08/19/17 72mo1, 30mo2, 85mo1 4PRN's for complications with disease process/progression, medication changes or concerns . Visits/Contact may be conducted telephonically or in person to  best  suit the patient.  c. Activity restrictions: Pt may be up as tolerated and can safely ambulate without the need for a assistive device   d. Safety Measures: Standard Precautions/Infection Control   e. Service Objectives and Goals: Service Objectives are to assist the pt with HIV medication regimen adherence and staying in care with the Infectious Disease Clinic by identifying barriers to care. RN will address the barriers that are identified by the patient.  Patient centered goals is to get and stay in care on adherent to his medications  f. Equipment required: No additional equipment needs at this time   g. Functional Limitations: None Noted. Right arm fistula and right chest have dialysis cath  h. Rehabilitation potential: Guarded   i. Diet and Nutritional Needs:Low phosphorus Diet/ fluid restriction  j. Medications and treatments: Medications have been reconciled and reviewed and are a part of EPIC electronic file   k. Specific therapies if needed: RN   l. Pertinent diagnoses: HIV disease,  Hx of medication NonCompliance, ESRD with diaysis  m. Expected outcome: Guarded   Initial Assessment  Points to Consider for Care  (Dialysis on Tues, Thurs and Sat )   Points are not all inclusive to services and educated provided but supports the patient's Individualized Plan of Care.  . Is Home safe for visits?/ Are all firearms or weapons secure? Home appears safe for visits/ no firearms or weapons noted to be unsecured  . Insurance Coverage: MCD pending/ Emergency ADAP processed on 05/17/17  . 1st HIV Diagnosis: 2002 in [place]  . Mode of HIV Transmission: thought to be through Heterosexual Contact  . Functional Status: Able to perform ADL's independently  . Current Housing/Needs:Social Support/System:Living in a 2-3 bedroom apartment with his fiance, children (3, 55, 10 and 11) and Mother in [place]. Mother-in-law is not aware of HIV status. Patient refers to his HIV meds as IV meds so his 36yr old dtr will not repeat the information either. Strongest support is his Elissa Lovett) who is aware of his status and has previously tested negative for HIV.  . Culture/Religion/Spirituality: Not discussed at this time   . Educational Background:Completed some of [employer]. He was in IT consultant during school. Becomes easily frustrated when challenges to care arrive  . Legal Issues: None current in [place]  . Access/Utilization of Community Resource: Currently  in care with Bridge Counselor(Mitch)  . Mental Health Concerns/Diagnosis: no signs of depression noted or previously diagnosed in our EPIC system  . Alcohol and/or Drug Use: Tobacco use but denies alcohol and substance abuse  . Risk and Knowledge of HIV and Reduction in Transmission: Current risk is patient is knowledgeable about HIV but has not been able to adhere/comply with his medication regimen since learning about his HIV status  in 2002. He has a basic understanding of HIV and understands that condom use for his is the best way to reduce the transmission of HIV  . Nutritional Needs: He struggles to comply with  the fluid restriction set for him due to his ESRD.

## 2017-09-30 NOTE — Telephone Encounter (Signed)
I approve of this plan of care. 

## 2017-10-07 ENCOUNTER — Ambulatory Visit (INDEPENDENT_AMBULATORY_CARE_PROVIDER_SITE_OTHER): Payer: Medicaid Other | Admitting: Infectious Diseases

## 2017-10-07 ENCOUNTER — Telehealth: Payer: Self-pay | Admitting: *Deleted

## 2017-10-07 ENCOUNTER — Encounter: Payer: Self-pay | Admitting: Infectious Diseases

## 2017-10-07 VITALS — BP 144/109 | HR 93 | Temp 97.7°F | Wt 260.0 lb

## 2017-10-07 DIAGNOSIS — I10 Essential (primary) hypertension: Secondary | ICD-10-CM | POA: Diagnosis present

## 2017-10-07 DIAGNOSIS — G63 Polyneuropathy in diseases classified elsewhere: Secondary | ICD-10-CM

## 2017-10-07 DIAGNOSIS — B20 Human immunodeficiency virus [HIV] disease: Secondary | ICD-10-CM

## 2017-10-07 DIAGNOSIS — Z23 Encounter for immunization: Secondary | ICD-10-CM | POA: Diagnosis not present

## 2017-10-07 MED ORDER — SULFAMETHOXAZOLE-TRIMETHOPRIM 400-80 MG PO TABS: 1.0000 | ORAL_TABLET | Freq: Every day | ORAL | 5 refills | Status: DC

## 2017-10-07 MED ORDER — DARUN-COBIC-EMTRICIT-TENOFAF 800-150-200-10 MG PO TABS: 1.0000 | ORAL_TABLET | Freq: Every day | ORAL | 5 refills | Status: DC

## 2017-10-07 MED ORDER — DOLUTEGRAVIR-RILPIVIRINE 50-25 MG PO TABS: 1.0000 | ORAL_TABLET | Freq: Every day | ORAL | 5 refills | Status: DC

## 2017-10-07 NOTE — Assessment & Plan Note (Signed)
He has not been taking his medications - will continue with current dose. Will defer to PCP / nephrology team regarding recommendations for now.

## 2017-10-07 NOTE — Assessment & Plan Note (Signed)
Improved on current renally adjusted gabapentin. Continue current dose.

## 2017-10-07 NOTE — Patient Instructions (Signed)
Please take your Symtuza, Juluca and Bactrim every day. I want to hear a better story that you have taken your medications every day next time.   Please come back in 2 months.   Expect a call from Ambre again to help work with you.

## 2017-10-07 NOTE — Telephone Encounter (Addendum)
Spoke with Mitch and I would love to re-engage with Everlean AlstromMaurice to offer assistance with medication adherence. Called the number on file (3 separate times) without an answer. Verified the number as well because the voice message says the name Joella PrinceJames Sullivan. I left a message anyway asking for a return call from [name]. His home is not one that I would "pop up" at so I hope this number is accurate.  Contacted [facility] and they stated they have never filled any HIV medications but the patient had a drop in his viral load from 12,000(Oct 2018 to 6800(Nov 2018) I remember seeing that the patient had viral medications during our last visit and it was his BP medications that he was out off and needed from [facility]. Looking forward to re-engaging with him.

## 2017-10-07 NOTE — Progress Notes (Signed)
James Schwartz 04-Feb-1981 [id number] PCP: Patient, No Pcp Per   Brief ID: [id number] y.o. [name] with ESRD on dialysis with HIV infection. Originally diagnosed with HIV in 2002 and previously in and out of care in [place]. HIV Risk: unprotected heterosexual sex. OI Hx: unknown.  Genotype: 07/2017 - M184V/M VL 6,870   Patient Active Problem List   Diagnosis Date Noted  . Polyneuropathy associated with underlying disease (HCC) 06/21/2017  . Dialysis patient (HCC)   . AIDS (acquired immune deficiency syndrome) (HCC)   . ESRD (end stage renal disease) (HCC)   . Hypertension 04/28/2017  . Asthma 04/28/2017  . Anemia 04/28/2017    Patient's Medications  New Prescriptions   No medications on file  Previous Medications   DARBEPOETIN ALFA (ARANESP) 100 MCG/0.5ML SOSY INJECTION    Inject 0.5 mLs (100 mcg total) into the vein every Wednesday with hemodialysis.   GABAPENTIN (NEURONTIN) 100 MG CAPSULE    Take 1 capsule (100 mg total) by mouth 2 (two) times daily.   METOPROLOL SUCCINATE (TOPROL XL) 25 MG 24 HR TABLET    Take 1 tablet (25 mg total) by mouth daily. TAKE WITH 50 MG tablet.   METOPROLOL SUCCINATE (TOPROL XL) 50 MG 24 HR TABLET    Take 1 tablet (50 mg total) by mouth daily. Take with or immediately following a meal.   MULTIVITAMIN (RENA-VIT) TABS TABLET    Take 1 tablet by mouth at bedtime.   SEVELAMER CARBONATE (RENVELA) 800 MG TABLET    Take 1 tablet (800 mg total) by mouth 3 (three) times daily with meals.  Modified Medications   Modified Medication Previous Medication   DARUNAVIR-COBICISCTAT-EMTRICITABINE-TENOFOVIR ALAFENAMIDE (SYMTUZA) 800-150-200-10 MG TABS Darunavir-Cobicisctat-Emtricitabine-Tenofovir Alafenamide (SYMTUZA) 800-150-200-10 MG TABS      Take 1 tablet by mouth daily with breakfast.    Take 1 tablet by mouth daily with breakfast.   DOLUTEGRAVIR-RILPIVIRINE (JULUCA) 50-25 MG TABS Dolutegravir-Rilpivirine (JULUCA) 50-25 MG TABS      Take 1 tablet by mouth daily with supper.     Take 1 tablet by mouth daily with supper.   SULFAMETHOXAZOLE-TRIMETHOPRIM (BACTRIM,SEPTRA) 400-80 MG TABLET sulfamethoxazole-trimethoprim (BACTRIM,SEPTRA) 400-80 MG tablet      Take 1 tablet by mouth daily.    Take 1 tablet by mouth daily.  Discontinued Medications   No medications on file    HPI:  James Schwartz is here today for follow up on HIV care. He tells me that he has been out of his medication "for a while" as he did not recognize the Symtuza or Juluca pill when Kent Narrowsravis, CMA pointed them out to him during med rec. He has been getting medications from [facility] but did not call to transfer his Darrell JewelSymtuza, Juluca or Bactrim rx's to the new pharmacy since his Medicaid was activated. He was previously doing "ok" with adherence - he has had a lot of situational/environmental/personal "drama" that he hopes will improve now and make it easier for him to take his medications. Reports no complaints today suggestive of associated opportunistic infection or advancing HIV disease such as fevers, night sweats, weight loss, anorexia, cough, SOB, nausea, vomiting, diarrhea, headache, sensory changes, lymphadenopathy or oral thrush.   Dialysis is going "as good as it can be" and he is ready to go back to see Dr. Briant CedarMattingly about his AVF as it has a 'bulge' in it. Ready to have CV catheter in right chest removed as it is troublesome for him. Reports he has noticed some dryness to  skin and itching since last visit. Blood pressure he notes is "always good" during dialysis and he has not taken meds today for elevated blood pressure. Denies any headaches, vision changes, chest pain today.   Review of Systems: Review of Systems  Constitutional: Negative for chills and fever.  HENT: Negative for tinnitus.   Eyes: Negative for blurred vision and photophobia.  Respiratory: Negative for cough and sputum production.   Cardiovascular: Negative for chest pain.  Gastrointestinal: Negative for diarrhea, nausea and  vomiting.  Genitourinary:       Anuric   Skin: Negative for rash.  Neurological: Negative for headaches.  Psychiatric/Behavioral: Positive for depression. The patient is nervous/anxious.     Past Medical History:  Diagnosis Date  . Asthma   . ESRD (end stage renal disease) (HCC)   . HIV (human immunodeficiency virus infection) (HCC)   . Hypertension 04/28/2017    Social History   Tobacco Use  . Smoking status: Current Every Day Smoker    Packs/day: 0.25    Types: Cigarettes    Start date: 09/24/2002  . Smokeless tobacco: Never Used  Substance Use Topics  . Alcohol use: No  . Drug use: No    Family History  Problem Relation Age of Onset  . Diabetes Mother     Allergies  Allergen Reactions  . Tomato Other (See Comments)    Mouth sores    Objective: Vitals:   10/07/17 1110  BP: (!) 144/109  Pulse: 93  Temp: 97.7 F (36.5 C)  TempSrc: Oral  Weight: 260 lb (117.9 kg)   Body mass index is 35.26 kg/m.  Physical Exam  Constitutional: He is oriented to person, place, and time and well-developed, well-nourished, and in no distress.  HENT:  Mouth/Throat: Oropharynx is clear and moist. No oral lesions. Normal dentition. No dental caries.  Eyes: No scleral icterus.  Cardiovascular: Normal rate, regular rhythm and normal heart sounds.  Pulmonary/Chest: Effort normal and breath sounds normal.  Abdominal: Soft. He exhibits no distension. There is no tenderness.  Musculoskeletal: Normal range of motion.  Lymphadenopathy:    He has no cervical adenopathy.  Neurological: He is alert and oriented to person, place, and time.  Skin: Skin is warm and dry. No rash noted.  Psychiatric: Mood and affect normal.  Poor medical insight   Vitals reviewed.   Lab Results Lab Results  Component Value Date   WBC 6.7 05/17/2017   HGB 9.8 (L) 05/17/2017   HCT 30.2 (L) 05/17/2017   MCV 93.5 05/17/2017   PLT 265 05/17/2017    Lab Results  Component Value Date   CREATININE 7.81  (H) 05/17/2017   BUN 32 (H) 05/17/2017   NA 136 05/17/2017   K 3.6 05/17/2017   CL 97 (L) 05/17/2017   CO2 27 05/17/2017    Lab Results  Component Value Date   ALT 11 05/17/2017   AST 17 05/17/2017   ALKPHOS 134 (H) 05/17/2017   BILITOT 0.4 05/17/2017    No results found for: CHOL, HDL, LDLCALC, LDLDIRECT, TRIG, CHOLHDL HIV 1 RNA Quant (copies/mL)  Date Value  08/05/2017 6,870 (H)  06/21/2017 12,000 (H)  04/28/2017 258,000   CD4 T Cell Abs (/uL)  Date Value  08/05/2017 155 (L)  04/28/2017 30 (L)   No results found for: HAV Lab Results  Component Value Date   HEPBSAG Negative 04/30/2017   HEPBSAB Reactive 04/30/2017   No results found for: HCVAB No results found for: CHLAMYDIAWP, N No results found  for: GCPROBEAPT Lab Results  Component Value Date   QUANTGOLD NEGATIVE 05/17/2017   No results found for: RPR    Problem List Items Addressed This Visit      Cardiovascular and Mediastinum   Hypertension - Primary    He has not been taking his medications - will continue with current dose. Will defer to PCP / nephrology team regarding recommendations for now.         Nervous and Auditory   Polyneuropathy associated with underlying disease (HCC)    Improved on current renally adjusted gabapentin. Continue current dose.         Other   AIDS (acquired immune deficiency syndrome) (HCC) (Chronic)    Uncontrolled and currently off medications for about a month by my estimate.   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 We discussed narrowing his regimen however he would like to continue on both regimens for now until he is suppressed.   Long discussion about adherence and how he needs to get his virus under better control to prevent spread to his wife as well as reduce overall risk for cancers, life-threatening  infections, dementia and other end organ damage associated with AIDS. For now we will continue both his Juluca + Symtuza for his HIV and Bactrim for OI prophylaxis. We did discuss his overall improvement from > 250k copies to [facility] however he should be suppressed by now being he has been back on ART x 6 months.   Will try to re-enroll him with Ambre to help with transitioning from Mitch's bridge services. I worry about his ability to be adherent. I have updated his phone numbers to reflect his current contact information as he received a new phone #.   No point in checking labs today. Will have him return in 2 months with labs at that visit since transportation is a problem for him, although he makes it to his dialysis appointments per his report routinely.       Relevant Medications   sulfamethoxazole-trimethoprim (BACTRIM,SEPTRA) 400-80 MG tablet   Darunavir-Cobicisctat-Emtricitabine-Tenofovir Alafenamide (SYMTUZA) 800-150-200-10 MG TABS   Dolutegravir-Rilpivirine (JULUCA) 50-25 MG TABS   RESOLVED: HIV disease (HCC) (Chronic)   Relevant Medications   sulfamethoxazole-trimethoprim (BACTRIM,SEPTRA) 400-80 MG tablet   Darunavir-Cobicisctat-Emtricitabine-Tenofovir Alafenamide (SYMTUZA) 800-150-200-10 MG TABS   Dolutegravir-Rilpivirine (JULUCA) 50-25 MG TABS    Other Visit Diagnoses    Need for immunization against influenza       Relevant Orders   Flu Vaccine QUAD 36+ mos IM (Completed)     I again discussed need for PrEP for his wife for HIV prevention. Also discussed concept of U=U and with good evidence of prolonged viral suppression essentially zero chance to transmit virus. Reinforced he is NOT currently undetectable and still at risk for passing HIV virus with unprotected sex.   Rexene Alberts, MSN, NP-C [facility] for Infectious Disease [facility] Pager: [phone]  10/07/17 2:55  PM

## 2017-10-07 NOTE — Assessment & Plan Note (Addendum)
Uncontrolled and currently off medications for about a month by my estimate.   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 We discussed narrowing his regimen however he would like to continue on both regimens for now until he is suppressed.   Long discussion about adherence and how he needs to get his virus under better control to prevent spread to his wife as well as reduce overall risk for cancers, life-threatening infections, dementia and other end organ damage associated with AIDS. For now we will continue both his Juluca + Symtuza for his HIV and Bactrim for OI prophylaxis. We did discuss his overall improvement from > 250k copies to [facility]6k however he should be suppressed by now being he has been back on ART x 6 months.   Will try to re-enroll him with [facility] to help with transitioning from [facility]. I worry about his ability to be adherent. I have updated his phone numbers to reflect his current contact information as he received a new phone #.   No point in checking labs today. Will have him return in 2 months with labs at that visit since transportation is a problem for him, although he makes it to his dialysis appointments per his report routinely.

## 2017-10-14 ENCOUNTER — Other Ambulatory Visit: Payer: Self-pay | Admitting: Infectious Diseases

## 2017-10-14 DIAGNOSIS — G63 Polyneuropathy in diseases classified elsewhere: Secondary | ICD-10-CM

## 2017-11-02 ENCOUNTER — Encounter (HOSPITAL_COMMUNITY): Payer: Self-pay | Admitting: *Deleted

## 2017-11-02 ENCOUNTER — Other Ambulatory Visit: Payer: Self-pay

## 2017-11-02 ENCOUNTER — Emergency Department (HOSPITAL_COMMUNITY): Payer: Medicaid Other

## 2017-11-02 ENCOUNTER — Emergency Department (HOSPITAL_COMMUNITY)
Admission: EM | Admit: 2017-11-02 | Discharge: 2017-11-02 | Discharge status: Against Medical Advice | Payer: Medicaid Other | Attending: Emergency Medicine | Admitting: Emergency Medicine

## 2017-11-02 DIAGNOSIS — R072 Precordial pain: Secondary | ICD-10-CM | POA: Insufficient documentation

## 2017-11-02 DIAGNOSIS — F17210 Nicotine dependence, cigarettes, uncomplicated: Secondary | ICD-10-CM | POA: Insufficient documentation

## 2017-11-02 DIAGNOSIS — Z79899 Other long term (current) drug therapy: Secondary | ICD-10-CM | POA: Diagnosis not present

## 2017-11-02 DIAGNOSIS — B20 Human immunodeficiency virus [HIV] disease: Secondary | ICD-10-CM | POA: Diagnosis not present

## 2017-11-02 DIAGNOSIS — Z5329 Procedure and treatment not carried out because of patient's decision for other reasons: Secondary | ICD-10-CM | POA: Insufficient documentation

## 2017-11-02 DIAGNOSIS — J45909 Unspecified asthma, uncomplicated: Secondary | ICD-10-CM | POA: Diagnosis not present

## 2017-11-02 DIAGNOSIS — Z9114 Patient's other noncompliance with medication regimen: Secondary | ICD-10-CM | POA: Insufficient documentation

## 2017-11-02 DIAGNOSIS — N186 End stage renal disease: Secondary | ICD-10-CM | POA: Insufficient documentation

## 2017-11-02 DIAGNOSIS — I120 Hypertensive chronic kidney disease with stage 5 chronic kidney disease or end stage renal disease: Secondary | ICD-10-CM | POA: Diagnosis not present

## 2017-11-02 DIAGNOSIS — R748 Abnormal levels of other serum enzymes: Secondary | ICD-10-CM | POA: Diagnosis not present

## 2017-11-02 DIAGNOSIS — Z992 Dependence on renal dialysis: Secondary | ICD-10-CM | POA: Insufficient documentation

## 2017-11-02 DIAGNOSIS — R7989 Other specified abnormal findings of blood chemistry: Secondary | ICD-10-CM

## 2017-11-02 DIAGNOSIS — R778 Other specified abnormalities of plasma proteins: Secondary | ICD-10-CM

## 2017-11-02 LAB — CBC
HCT: 36.5 % — ABNORMAL LOW (ref 39.0–52.0)
Hemoglobin: 12.1 g/dL — ABNORMAL LOW (ref 13.0–17.0)
MCH: 31.9 pg (ref 26.0–34.0)
MCHC: 33.2 g/dL (ref 30.0–36.0)
MCV: 96.3 fL (ref 78.0–100.0)
Platelets: 192 10*3/uL (ref 150–400)
RBC: 3.79 MIL/uL — ABNORMAL LOW (ref 4.22–5.81)
RDW: 17.3 % — ABNORMAL HIGH (ref 11.5–15.5)
WBC: 4.3 10*3/uL (ref 4.0–10.5)

## 2017-11-02 LAB — BASIC METABOLIC PANEL
Anion gap: 15 (ref 5–15)
BUN: 50 mg/dL — ABNORMAL HIGH (ref 6–20)
CO2: 20 mmol/L — ABNORMAL LOW (ref 22–32)
Calcium: 9 mg/dL (ref 8.9–10.3)
Chloride: 100 mmol/L — ABNORMAL LOW (ref 101–111)
Creatinine, Ser: 11.21 mg/dL — ABNORMAL HIGH (ref 0.61–1.24)
GFR calc Af Amer: 6 mL/min — ABNORMAL LOW (ref >60)
GFR calc non Af Amer: 5 mL/min — ABNORMAL LOW (ref >60)
Glucose, Bld: 79 mg/dL (ref 65–99)
Potassium: 4.4 mmol/L (ref 3.5–5.1)
Sodium: 135 mmol/L (ref 135–145)

## 2017-11-02 LAB — TROPONIN I: Troponin I: 0.08 ng/mL (ref <0.03)

## 2017-11-02 NOTE — ED Provider Notes (Signed)
[facility] Provider Note   CSN: [id number] Arrival date & time: 11/02/17  1539     History   Chief Complaint Chief Complaint  Patient presents with  . Chest Pain    HPI James Schwartz is a 37 y.o. male.  Patient with history of HIV, last CD4 in 100's in 11/19, ESRD on hemodialysis T/Th/Sa --presents with onset of chest pain at approximately 3 PM.  Patient describes left-sided chest pain.  No associated diaphoresis, nausea or vomiting, exertional symptoms.  He has not had pain like this before.  Moves to his left shoulder.  Not exacerbated with movement.  Patient states that he did not lose consciousness but felt dazed when it started.  He reports some palpitations last night that he thought was due to excess fluid.  He missed dialysis today due to transportation problems.  No lower extremity swelling.  No fevers, cough, or shortness of breath.  No abdominal pain.  States that he has missed a couple days of his HIV medications recently.  Cardiac risk factors: HTN, HIV, smoking, obesity.  No treatments prior to arrival.  Onset of symptoms acute.  Course is improving.  Nothing makes symptoms better or worse.       Past Medical History:  Diagnosis Date  . Asthma   . ESRD (end stage renal disease) (HCC)   . HIV (human immunodeficiency virus infection) (HCC)   . Hypertension 04/28/2017    Patient Active Problem List   Diagnosis Date Noted  . Polyneuropathy associated with underlying disease (HCC) 06/21/2017  . Dialysis patient (HCC)   . AIDS (acquired immune deficiency syndrome) (HCC)   . ESRD (end stage renal disease) (HCC)   . Hypertension 04/28/2017  . Asthma 04/28/2017  . Anemia 04/28/2017    Past Surgical History:  Procedure Laterality Date  . AV FISTULA PLACEMENT Right 05/06/2017   Procedure: ARTERIOVENOUS (AV) FISTULA CREATION RIGHT ARM;  Surgeon: Fransisco Hertzhen, Brian L, MD;  [facility];  Service: Vascular;  Laterality: Right;  .  EXCHANGE OF A DIALYSIS CATHETER Right 05/06/2017   Procedure: EXCHANGE OF A DIALYSIS CATHETER RIGHT;  Surgeon: Fransisco Hertzhen, Brian L, MD;  [facility];  Service: Vascular;  Laterality: Right;  . IR FLUORO GUIDE CV LINE RIGHT  04/29/2017  . IR US GUIDE VASC ACCESS RIGHT  04/29/2017       Home Medications    Prior to Admission medications   Medication Sig Start Date End Date Taking? Authorizing Provider  Darbepoetin Alfa (ARANESP) 100 MCG/0.5ML SOSY injection Inject 0.5 mLs (100 mcg total) into the vein every Wednesday with hemodialysis. 05/08/17   Gwynn BurlyWallace, Andrew, DO  Darunavir-Cobicisctat-Emtricitabine-Tenofovir Alafenamide [facility](SYMTUZA) 800-150-200-10 MG TABS Take 1 tablet by mouth daily with breakfast. 10/07/17   Blanchard Kelchixon, Stephanie N, NP  Dolutegravir-Rilpivirine (JULUCA) 50-25 MG TABS Take 1 tablet by mouth daily with supper. 10/07/17   Blanchard Kelchixon, Stephanie N, NP  gabapentin (NEURONTIN) 100 MG capsule TAKE 1 CAPSULE (100 MG TOTAL) BY MOUTH TWO (TWO) TIMES DAILY. 10/14/17   Blanchard Kelchixon, Stephanie N, NP  metoprolol succinate (TOPROL XL) 25 MG 24 hr tablet Take 1 tablet (25 mg total) by mouth daily. TAKE WITH 50 MG tablet. 06/21/17   Blanchard Kelchixon, Stephanie N, NP  metoprolol succinate (TOPROL XL) 50 MG 24 hr tablet Take 1 tablet (50 mg total) by mouth daily. Take with or immediately following a meal. 05/17/17 05/17/18  Lorenso Courierhundi, Vahini, MD  multivitamin (RENA-VIT) TABS tablet Take 1 tablet by mouth at bedtime. 05/06/17  Gwynn Burly, DO  sevelamer carbonate (RENVELA) 800 MG tablet Take 1 tablet (800 mg total) by mouth 3 (three) times daily with meals. 05/06/17   Gwynn Burly, DO  sulfamethoxazole-trimethoprim (BACTRIM,SEPTRA) 400-80 MG tablet Take 1 tablet by mouth daily. 10/07/17   Blanchard Kelch, NP    Family History Family History  Problem Relation Age of Onset  . Diabetes Mother     Social History Social History   Tobacco Use  . Smoking status: Current Every Day Smoker    Packs/day: 0.25    Types: Cigarettes      Start date: 09/24/2002  . Smokeless tobacco: Never Used  Substance Use Topics  . Alcohol use: No  . Drug use: No     Allergies   Tomato   Review of Systems Review of Systems  Constitutional: Negative for diaphoresis and fever.  Eyes: Negative for redness.  Respiratory: Negative for cough and shortness of breath.   Cardiovascular: Positive for chest pain. Negative for palpitations and leg swelling.  Gastrointestinal: Negative for abdominal pain, nausea and vomiting.  Genitourinary: Negative for dysuria.  Musculoskeletal: Negative for back pain and neck pain.  Skin: Negative for rash.  Neurological: Positive for light-headedness. Negative for dizziness and syncope.  Psychiatric/Behavioral: The patient is not nervous/anxious.      Physical Exam Updated Vital Signs BP (!) 132/97 (BP Location: Left Arm)   Pulse 93   Temp 99.5 F (37.5 C) (Oral)   Resp 20   Ht 6\' 1"  (1.854 m)   Wt 117.9 kg (260 lb)   SpO2 100%   BMI 34.30 kg/m   Physical Exam  Constitutional: He appears well-developed and well-nourished.  HENT:  Head: Normocephalic and atraumatic.  Mouth/Throat: Mucous membranes are normal. Mucous membranes are not dry.  Eyes: Conjunctivae are normal.  Neck: Trachea normal and normal range of motion. Neck supple. Normal carotid pulses and no JVD present. No muscular tenderness present. Carotid bruit is not present. No tracheal deviation present.  Cardiovascular: Normal rate, regular rhythm, S1 normal, S2 normal, normal heart sounds and intact distal pulses. Exam reveals no distant heart sounds and no decreased pulses.  No murmur heard. Pulmonary/Chest: Effort normal and breath sounds normal. No respiratory distress. He has no wheezes. He exhibits no tenderness.  Abdominal: Soft. Normal aorta and bowel sounds are normal. There is no tenderness. There is no rebound and no guarding.  Musculoskeletal: He exhibits no edema.       Right lower leg: He exhibits no edema.        Left lower leg: He exhibits no edema.  No significant edema, fluid overload.   Neurological: He is alert.  Skin: Skin is warm and dry. He is not diaphoretic. No cyanosis. No pallor.  Psychiatric: He has a normal mood and affect.  Nursing note and vitals reviewed.    ED Treatments / Results  Labs (all labs ordered are listed, but only abnormal results are displayed) Labs Reviewed  BASIC METABOLIC PANEL - Abnormal; Notable for the following components:      Result Value   Chloride 100 (*)    CO2 20 (*)    BUN 50 (*)    Creatinine, Ser 11.21 (*)    GFR calc non Af Amer 5 (*)    GFR calc Af Amer 6 (*)    All other components within normal limits  CBC - Abnormal; Notable for the following components:   RBC 3.79 (*)    Hemoglobin 12.1 (*)  HCT 36.5 (*)    RDW 17.3 (*)    All other components within normal limits  TROPONIN I - Abnormal; Notable for the following components:   Troponin I 0.08 (*)    All other components within normal limits    EKG  EKG Interpretation  Date/Time:  Saturday November 02 2017 15:42:49 EST Ventricular Rate:  99 PR Interval:  138 QRS Duration: 72 QT Interval:  358 QTC Calculation: 459 R Axis:   46 Text Interpretation:  Normal sinus rhythm Possible Left atrial enlargement T wave abnormality, consider inferolateral ischemia Abnormal ECG new inf and lat t inversions compared with prior 8/18 Confirmed by Meridee Score [phone]) on 11/02/2017 4:07:42 PM       Radiology Dg Chest 2 View  Result Date: 11/02/2017 CLINICAL DATA:  Chest pain.  Chronic renal failure EXAM: CHEST  2 VIEW COMPARISON:  May 06, 2017 FINDINGS: Central catheter tip is in the superior vena cava. No pneumothorax. There is no appreciable edema or consolidation. The heart size and pulmonary vascularity are normal. No adenopathy. No bone lesions. IMPRESSION: Central catheter tip in superior vena cava. No pneumothorax. No edema or consolidation. Electronically Signed   By: Bretta Bang III M.D.   On: 11/02/2017 16:12    Procedures Procedures (including critical care time)  Medications Ordered in ED Medications - No data to display   Initial Impression / Assessment and Plan / ED Course  I have reviewed the triage vital signs and the nursing notes.  Pertinent labs & imaging results that were available during my care of the patient were reviewed by me and considered in my medical decision making (see chart for details).     6:25 PM Patient seen and examined. EKG is abnormal.   Discussed with patient that troponin is abnormal, unclear if this is from his heart or related to being ESRD patient. Discussed that we will need to recheck troponin at 6:50pm. He verbalizes understanding.  6:51 PM Informed by RN that patient no longer in the room. He has eloped without informing staff.   Vital signs reviewed and are as follows: BP (!) 132/97 (BP Location: Left Arm)   Pulse 93   Temp 99.5 F (37.5 C) (Oral)   Resp 20   Ht 6\' 1"  (1.854 m)   Wt 117.9 kg (260 lb)   SpO2 100%   BMI 34.30 kg/m   7:01 PM I attempted to call the phone number in Epic to speak with the patient. It went directly to voicemail. I left a message repeating that he has abnormalities on work-up, that we need to assure he is not having any heart problems, and advised return ASAP, especially if symptoms worsen.   Final Clinical Impressions(s) / ED Diagnoses   Final diagnoses:  Precordial pain  Elevated troponin  ESRD (end stage renal disease) (HCC)   Pt with ESRD, missed dialysis, elevated trop (0.08) and EKG changes. Concern for ischemia. Unfortunately patient eloped.   ED Discharge Orders    None       Renne Crigler, PA-C 11/02/17 1854    Renne Crigler, PA-C 11/02/17 Rudell Cobb, MD 11/09/17 1651

## 2017-11-02 NOTE — ED Notes (Signed)
Patient left with significant other and stated he doesn't want to be seen. Patient ambulatory, alert and oriented, NAD

## 2017-11-02 NOTE — ED Notes (Addendum)
patient here after arriving by EMS and now visiting girlfriend who is also patient. States that he is fine and needed ride, doesn't want to be seen.

## 2017-11-02 NOTE — ED Notes (Signed)
Pt visiting sister in [place]

## 2017-11-02 NOTE — ED Notes (Signed)
Spoke with patient again regarding elevated troponin, he has changed his mind and will be seen

## 2017-11-02 NOTE — ED Triage Notes (Signed)
The pt is c/o chest pain for 2 days  He is a dialysis pt that skipped dialysis Thursday and he has had taken his meds for 2 days   Dialysis cath rt upper  chesdt

## 2017-11-02 NOTE — ED Notes (Signed)
Patient states he does not want an IV at this time. 

## 2017-11-04 ENCOUNTER — Telehealth: Payer: Self-pay | Admitting: *Deleted

## 2017-11-04 NOTE — Telephone Encounter (Signed)
Rn received a email from Education administratorMitch(Bridge Counselor) stating the patient is still not doing well with his care and has a new number. Patient frequently does not have a way to get in contact with him. Called the patient and we arranged for me to come every Wednesday from 11:30 to 12:30 to assist him with managing his care. Next visit is planned for this Wednesday

## 2017-11-06 ENCOUNTER — Ambulatory Visit: Payer: Self-pay | Admitting: *Deleted

## 2017-11-06 VITALS — BP 140/110 | HR 80

## 2017-11-06 DIAGNOSIS — B20 Human immunodeficiency virus [HIV] disease: Secondary | ICD-10-CM

## 2017-11-06 DIAGNOSIS — I10 Essential (primary) hypertension: Secondary | ICD-10-CM

## 2017-11-13 ENCOUNTER — Telehealth: Payer: Self-pay | Admitting: *Deleted

## 2017-11-13 ENCOUNTER — Ambulatory Visit: Payer: Medicaid Other | Admitting: *Deleted

## 2017-11-14 ENCOUNTER — Ambulatory Visit: Payer: Medicaid Other | Admitting: *Deleted

## 2017-11-20 ENCOUNTER — Ambulatory Visit: Payer: Medicaid Other

## 2017-11-20 ENCOUNTER — Other Ambulatory Visit: Payer: Self-pay | Admitting: Behavioral Health

## 2017-11-20 ENCOUNTER — Telehealth: Payer: Self-pay | Admitting: *Deleted

## 2017-11-20 ENCOUNTER — Ambulatory Visit: Payer: Self-pay | Admitting: *Deleted

## 2017-11-20 DIAGNOSIS — N186 End stage renal disease: Secondary | ICD-10-CM

## 2017-11-20 DIAGNOSIS — Z79899 Other long term (current) drug therapy: Secondary | ICD-10-CM

## 2017-11-20 DIAGNOSIS — Z113 Encounter for screening for infections with a predominantly sexual mode of transmission: Secondary | ICD-10-CM

## 2017-11-20 DIAGNOSIS — B20 Human immunodeficiency virus [HIV] disease: Secondary | ICD-10-CM

## 2017-11-20 NOTE — Telephone Encounter (Signed)
Contacted the patient to confirm our prearranged visit for today. Patient stated declined this visit and asked for me to call him later. RN agreed to give the patient a call back after his dialysis tomorrow to see if we can arrange a visit at that time. Purpose of this visit is to refill his pillbox, which was filled last week for 7 days.

## 2017-11-20 NOTE — Telephone Encounter (Signed)
Contacted the patient to confirm our prearranged visit for today. Patient stated declined this visit and asked for me to call him later. RN asked the patient to call me instead once he is ready for a home visit. Purpose of visit is to fill his pill box for 7 days.

## 2017-11-27 ENCOUNTER — Ambulatory Visit: Payer: Self-pay | Admitting: *Deleted

## 2017-11-27 DIAGNOSIS — I10 Essential (primary) hypertension: Secondary | ICD-10-CM

## 2017-11-27 DIAGNOSIS — B20 Human immunodeficiency virus [HIV] disease: Secondary | ICD-10-CM

## 2017-11-27 DIAGNOSIS — N186 End stage renal disease: Secondary | ICD-10-CM

## 2017-11-27 NOTE — Progress Notes (Addendum)
Contacted James Schwartz to confirm our prearranged appt. James Schwartz stated he is currently in [place] and will be returning tomorrow. Tomorrow is a dialysis day so I asked James Schwartz to please call me when he is available so we can prefill his pill box  The intent of this communication is to inform the Health Care Team that this patient will be [facility]  Greater than 3 attempts have been made to re-engage the patient  without any success .Moving forward, [facility] will [facility] willing to [facility] the patient to services if and when the patient is ready to discuss medication adherence and HIV disease  management. Effective 11/27/17  patient will be discharged and removed from [facility] active patient listing.

## 2017-12-03 ENCOUNTER — Emergency Department (HOSPITAL_COMMUNITY): Payer: Medicaid Other

## 2017-12-03 ENCOUNTER — Other Ambulatory Visit: Payer: Self-pay

## 2017-12-03 ENCOUNTER — Encounter (HOSPITAL_COMMUNITY): Payer: Self-pay

## 2017-12-03 ENCOUNTER — Emergency Department (HOSPITAL_COMMUNITY)
Admission: EM | Admit: 2017-12-03 | Discharge: 2017-12-03 | Disposition: A | Discharge status: Home / Self Care | Payer: Medicaid Other | Attending: Emergency Medicine | Admitting: Emergency Medicine

## 2017-12-03 DIAGNOSIS — J45909 Unspecified asthma, uncomplicated: Secondary | ICD-10-CM | POA: Diagnosis not present

## 2017-12-03 DIAGNOSIS — I120 Hypertensive chronic kidney disease with stage 5 chronic kidney disease or end stage renal disease: Secondary | ICD-10-CM | POA: Insufficient documentation

## 2017-12-03 DIAGNOSIS — F17210 Nicotine dependence, cigarettes, uncomplicated: Secondary | ICD-10-CM | POA: Insufficient documentation

## 2017-12-03 DIAGNOSIS — R05 Cough: Secondary | ICD-10-CM | POA: Diagnosis present

## 2017-12-03 DIAGNOSIS — J101 Influenza due to other identified influenza virus with other respiratory manifestations: Secondary | ICD-10-CM | POA: Diagnosis not present

## 2017-12-03 DIAGNOSIS — Z79899 Other long term (current) drug therapy: Secondary | ICD-10-CM | POA: Diagnosis not present

## 2017-12-03 DIAGNOSIS — N186 End stage renal disease: Secondary | ICD-10-CM | POA: Diagnosis not present

## 2017-12-03 DIAGNOSIS — Z992 Dependence on renal dialysis: Secondary | ICD-10-CM | POA: Diagnosis not present

## 2017-12-03 LAB — INFLUENZA PANEL BY PCR (TYPE A & B)
INFLAPCR: POSITIVE — AB
Influenza B By PCR: NEGATIVE

## 2017-12-03 LAB — CBC WITH DIFFERENTIAL/PLATELET
BASOS ABS: 0 10*3/uL (ref 0.0–0.1)
BASOS PCT: 0 %
EOS ABS: 0.5 10*3/uL (ref 0.0–0.7)
EOS PCT: 10 %
HCT: 32.2 % — ABNORMAL LOW (ref 39.0–52.0)
Hemoglobin: 10.6 g/dL — ABNORMAL LOW (ref 13.0–17.0)
Lymphocytes Relative: 20 %
Lymphs Abs: 0.9 10*3/uL (ref 0.7–4.0)
MCH: 31.8 pg (ref 26.0–34.0)
MCHC: 32.9 g/dL (ref 30.0–36.0)
MCV: 96.7 fL (ref 78.0–100.0)
Monocytes Absolute: 0.5 10*3/uL (ref 0.1–1.0)
Monocytes Relative: 11 %
Neutro Abs: 2.8 10*3/uL (ref 1.7–7.7)
Neutrophils Relative %: 59 %
PLATELETS: 173 10*3/uL (ref 150–400)
RBC: 3.33 MIL/uL — AB (ref 4.22–5.81)
RDW: 17 % — ABNORMAL HIGH (ref 11.5–15.5)
WBC: 4.8 10*3/uL (ref 4.0–10.5)

## 2017-12-03 LAB — COMPREHENSIVE METABOLIC PANEL
ALT: 9 U/L — AB (ref 17–63)
AST: 14 U/L — AB (ref 15–41)
Albumin: 3.1 g/dL — ABNORMAL LOW (ref 3.5–5.0)
Alkaline Phosphatase: 75 U/L (ref 38–126)
Anion gap: 10 (ref 5–15)
BILIRUBIN TOTAL: 0.5 mg/dL (ref 0.3–1.2)
BUN: 68 mg/dL — AB (ref 6–20)
CO2: 16 mmol/L — ABNORMAL LOW (ref 22–32)
CREATININE: 13.46 mg/dL — AB (ref 0.61–1.24)
Calcium: 8.7 mg/dL — ABNORMAL LOW (ref 8.9–10.3)
Chloride: 111 mmol/L (ref 101–111)
GFR calc Af Amer: 5 mL/min — ABNORMAL LOW (ref >60)
GFR, EST NON AFRICAN AMERICAN: 4 mL/min — AB (ref >60)
Glucose, Bld: 77 mg/dL (ref 65–99)
Potassium: 4.6 mmol/L (ref 3.5–5.1)
Sodium: 137 mmol/L (ref 135–145)
TOTAL PROTEIN: 7.8 g/dL (ref 6.5–8.1)

## 2017-12-03 MED ORDER — ALBUTEROL SULFATE HFA 108 (90 BASE) MCG/ACT IN AERS
1.0000 | INHALATION_SPRAY | Freq: Once | RESPIRATORY_TRACT | Status: AC
Start: 2017-12-03 — End: 2017-12-03
  Administered 2017-12-03: 2 via RESPIRATORY_TRACT
  Filled 2017-12-03: qty 6.7

## 2017-12-03 MED ORDER — OSELTAMIVIR PHOSPHATE 30 MG PO CAPS: 30.0000 mg | ORAL_CAPSULE | Freq: Every day | ORAL | 0 refills | Status: DC

## 2017-12-03 MED ORDER — HYDROCODONE-ACETAMINOPHEN 5-325 MG PO TABS: 1.0000 | ORAL_TABLET | ORAL | 0 refills | Status: AC | PRN

## 2017-12-03 MED ORDER — SODIUM CHLORIDE 0.9 % IV SOLN: 1.0000 g | Freq: Once | INTRAVENOUS | Status: DC

## 2017-12-03 MED ORDER — AZITHROMYCIN 250 MG PO TABS: 500.0000 mg | ORAL_TABLET | Freq: Once | ORAL | Status: DC

## 2017-12-03 MED ORDER — HYDROCODONE-ACETAMINOPHEN 5-325 MG PO TABS
1.0000 | ORAL_TABLET | Freq: Once | ORAL | Status: AC
  Administered 2017-12-03: 1 via ORAL
  Filled 2017-12-03: qty 1

## 2017-12-03 MED ORDER — IPRATROPIUM-ALBUTEROL 0.5-2.5 (3) MG/3ML IN SOLN
3.0000 mL | Freq: Once | RESPIRATORY_TRACT | Status: AC
  Administered 2017-12-03: 3 mL via RESPIRATORY_TRACT
  Filled 2017-12-03: qty 3

## 2017-12-03 MED ORDER — OSELTAMIVIR PHOSPHATE 30 MG PO CAPS
30.0000 mg | ORAL_CAPSULE | Freq: Once | ORAL | Status: AC
  Administered 2017-12-03: 30 mg via ORAL
  Filled 2017-12-03: qty 1

## 2017-12-03 MED ORDER — OPTICHAMBER DIAMOND MISC
1.0000 | Freq: Once | Status: AC
  Administered 2017-12-03: 1

## 2017-12-03 MED ORDER — ALBUTEROL SULFATE (2.5 MG/3ML) 0.083% IN NEBU
2.5000 mg | INHALATION_SOLUTION | Freq: Once | RESPIRATORY_TRACT | Status: AC
Start: 2017-12-03 — End: 2017-12-03
  Administered 2017-12-03: 2.5 mg via RESPIRATORY_TRACT
  Filled 2017-12-03: qty 3

## 2017-12-03 NOTE — ED Notes (Signed)
Patient transported to X-ray 

## 2017-12-03 NOTE — ED Triage Notes (Signed)
PT arrives via EMS from home with complaints of productive cough, fever, chills, right shoulder pain, n/v. Pt is dialysis pt and has not been in 2 weeks d/t being "away".   190/110 Hr 86 NSR  99% RA

## 2017-12-03 NOTE — ED Provider Notes (Signed)
[facility] EMERGENCY DEPARTMENT Provider Note   CSN: [id number] Arrival date & time: 12/03/17  1035     History   Chief Complaint No chief complaint on file.   HPI James Schwartz is a 37 y.o. male.  Pt presents to the ED today with sob and productive cough.  He has had f/c.  The pt has a hx of HIV with noncompliance with medications.  He also has ESRD on HD.  The pt went to [place] last week and did not go to dialysis at all last week (last dialysis 3/2) .  Pt has a hx of difficulty with transportation, so often misses his doctor's appointments.  Neither he nor his wife knows to which dialysis location he goes.  He normally gets dialysis Tu, Th, Sat.      Past Medical History:  Diagnosis Date  . Asthma   . ESRD (end stage renal disease) (HCC)   . HIV (human immunodeficiency virus infection) (HCC)   . Hypertension 04/28/2017    Patient Active Problem List   Diagnosis Date Noted  . Polyneuropathy associated with underlying disease (HCC) 06/21/2017  . Dialysis patient (HCC)   . AIDS (acquired immune deficiency syndrome) (HCC)   . ESRD (end stage renal disease) (HCC)   . Hypertension 04/28/2017  . Asthma 04/28/2017  . Anemia 04/28/2017    Past Surgical History:  Procedure Laterality Date  . AV FISTULA PLACEMENT Right 05/06/2017   Procedure: ARTERIOVENOUS (AV) FISTULA CREATION RIGHT ARM;  Surgeon: Fransisco Hertz, MD;  Location: [facility] OR;  Service: Vascular;  Laterality: Right;  . EXCHANGE OF A DIALYSIS CATHETER Right 05/06/2017   Procedure: EXCHANGE OF A DIALYSIS CATHETER RIGHT;  Surgeon: Fransisco Hertz, MD;  Location: [facility] OR;  Service: Vascular;  Laterality: Right;  . IR FLUORO GUIDE CV LINE RIGHT  04/29/2017  . IR US GUIDE VASC ACCESS RIGHT  04/29/2017       Home Medications    Prior to Admission medications   Medication Sig Start Date End Date Taking? Authorizing Provider  Darbepoetin Alfa (ARANESP) 100 MCG/0.5ML SOSY injection Inject 0.5 mLs (100 mcg  total) into the vein every Wednesday with hemodialysis. 05/08/17  Yes Gwynn Burly, DO  Darunavir-Cobicisctat-Emtricitabine-Tenofovir Alafenamide [facility]) 800-150-200-10 MG TABS Take 1 tablet by mouth daily with breakfast. 10/07/17  Yes Dixon, Gomez Cleverly, NP  Dolutegravir-Rilpivirine (JULUCA) 50-25 MG TABS Take 1 tablet by mouth daily with supper. 10/07/17  Yes Dixon, Gomez Cleverly, NP  gabapentin (NEURONTIN) 100 MG capsule TAKE 1 CAPSULE (100 MG TOTAL) BY MOUTH TWO (TWO) TIMES DAILY. 10/14/17  Yes Blanchard Kelch, NP  metoprolol succinate (TOPROL XL) 50 MG 24 hr tablet Take 1 tablet (50 mg total) by mouth daily. Take with or immediately following a meal. 05/17/17 05/17/18 Yes Chundi, Vahini, MD  multivitamin (RENA-VIT) TABS tablet Take 1 tablet by mouth at bedtime. 05/06/17  Yes Gwynn Burly, DO  sulfamethoxazole-trimethoprim (BACTRIM,SEPTRA) 400-80 MG tablet Take 1 tablet by mouth daily. 10/07/17  Yes Blanchard Kelch, NP  HYDROcodone-acetaminophen (NORCO/VICODIN) 5-325 MG tablet Take 1 tablet by mouth every 4 (four) hours as needed. 12/03/17   Jacalyn Lefevre, MD  metoprolol succinate (TOPROL XL) 25 MG 24 hr tablet Take 1 tablet (25 mg total) by mouth daily. TAKE WITH 50 MG tablet. Patient not taking: Reported on 12/03/2017 06/21/17   Blanchard Kelch, NP  oseltamivir (TAMIFLU) 30 MG capsule Take 1 capsule (30 mg total) by mouth daily. On dialysis days, take after dialysis  12/04/17   Jacalyn Lefevre, MD    Family History Family History  Problem Relation Age of Onset  . Diabetes Mother     Social History Social History   Tobacco Use  . Smoking status: Current Every Day Smoker    Packs/day: 0.25    Types: Cigarettes    Start date: 09/24/2002  . Smokeless tobacco: Never Used  Substance Use Topics  . Alcohol use: No  . Drug use: No     Allergies   Tomato   Review of Systems Review of Systems  Respiratory: Positive for cough, shortness of breath and wheezing.   All other systems  reviewed and are negative.    Physical Exam Updated Vital Signs BP (!) 159/106   Pulse 92   Temp 99.5 F (37.5 C) (Oral)   Resp (!) 41   SpO2 91%   Physical Exam  Constitutional: He is oriented to person, place, and time. He appears well-developed and well-nourished.  HENT:  Head: Normocephalic and atraumatic.  Right Ear: External ear normal.  Left Ear: External ear normal.  Nose: Nose normal.  Mouth/Throat: Oropharynx is clear and moist.  Eyes: Conjunctivae and EOM are normal. Pupils are equal, round, and reactive to light.  Neck: Normal range of motion. Neck supple.  Cardiovascular: Normal rate, regular rhythm, normal heart sounds and intact distal pulses.  Pulmonary/Chest: Effort normal and breath sounds normal.  Right upper chest HD port  Abdominal: Soft. Bowel sounds are normal.  Musculoskeletal: Normal range of motion.  RUE AVF with good thrill   Neurological: He is alert and oriented to person, place, and time.  Skin: Skin is warm. Capillary refill takes less than 2 seconds.  Psychiatric: He has a normal mood and affect. His behavior is normal. Judgment and thought content normal.  Nursing note and vitals reviewed.    ED Treatments / Results  Labs (all labs ordered are listed, but only abnormal results are displayed) Labs Reviewed  COMPREHENSIVE METABOLIC PANEL - Abnormal; Notable for the following components:      Result Value   CO2 16 (*)    BUN 68 (*)    Creatinine, Ser 13.46 (*)    Calcium 8.7 (*)    Albumin 3.1 (*)    AST 14 (*)    ALT 9 (*)    GFR calc non Af Amer 4 (*)    GFR calc Af Amer 5 (*)    All other components within normal limits  CBC WITH DIFFERENTIAL/PLATELET - Abnormal; Notable for the following components:   RBC 3.33 (*)    Hemoglobin 10.6 (*)    HCT 32.2 (*)    RDW 17.0 (*)    All other components within normal limits  INFLUENZA PANEL BY PCR (TYPE A & B) - Abnormal; Notable for the following components:   Influenza A By PCR  POSITIVE (*)    All other components within normal limits    EKG  EKG Interpretation  Date/Time:  Tuesday December 03 2017 10:42:33 EDT Ventricular Rate:  84 PR Interval:    QRS Duration: 89 QT Interval:  407 QTC Calculation: 482 R Axis:   52 Text Interpretation:  Sinus rhythm Nonspecific T abnormalities, lateral leads Borderline prolonged QT interval Confirmed by Jacalyn Lefevre [phone]) on 12/03/2017 11:01:19 AM       Radiology Dg Chest 2 View  Result Date: 12/03/2017 CLINICAL DATA:  Productive cough, fever, and chills. EXAM: CHEST - 2 VIEW COMPARISON:  Chest x-ray dated November 02, 2017.  FINDINGS: Unchanged tunneled right internal jugular dialysis catheter with the tip in the distal SVC. Mild cardiomegaly. Pulmonary vascular congestion. Mild perihilar and bibasilar opacities. No pleural effusion or pneumothorax. No acute osseous abnormality. IMPRESSION: 1. Mild perihilar and bibasilar opacities could reflect pneumonia or pulmonary edema. 2. Mild cardiomegaly with pulmonary vascular congestion. Electronically Signed   By: Obie DredgeWilliam T Derry M.D.   On: 12/03/2017 11:25    Procedures Procedures (including critical care time)  Medications Ordered in ED Medications  albuterol (PROVENTIL) (2.5 MG/3ML) 0.083% nebulizer solution 2.5 mg (not administered)  oseltamivir (TAMIFLU) capsule 30 mg (not administered)  HYDROcodone-acetaminophen (NORCO/VICODIN) 5-325 MG per tablet 1 tablet (not administered)  albuterol (PROVENTIL HFA;VENTOLIN HFA) 108 (90 Base) MCG/ACT inhaler 1-2 puff (not administered)  AEROCHAMBER PLUS FLO-VU MEDIUM MISC 1 each (not administered)  ipratropium-albuterol (DUONEB) 0.5-2.5 (3) MG/3ML nebulizer solution 3 mL (3 mLs Nebulization Given 12/03/17 1120)     Initial Impression / Assessment and Plan / ED Course  I have reviewed the triage vital signs and the nursing notes.  Pertinent labs & imaging results that were available during my care of the patient were reviewed by  me and considered in my medical decision making (see chart for details).     [address]  I did call there, and they did not have any openings tomorrow.  I spoke with Dr. Kathrene BongoGoldsborough (nephrology) who thinks pt can wait for dialysis as long as his potassium is ok and he is not severely fluid overloaded.  He is neither of those.  The pt will be d/c home with instructions to return if worse.   Final Clinical Impressions(s) / ED Diagnoses   Final diagnoses:  Influenza A  ESRD on hemodialysis [facility](HCC)    ED Discharge Orders        Ordered    oseltamivir (TAMIFLU) 30 MG capsule  Daily     12/03/17 1426    HYDROcodone-acetaminophen (NORCO/VICODIN) 5-325 MG tablet  Every 4 hours PRN     12/03/17 1426       Jacalyn LefevreHaviland, Caree Wolpert, MD 12/03/17 1427

## 2017-12-04 ENCOUNTER — Ambulatory Visit: Payer: Medicaid Other | Admitting: Infectious Diseases

## 2017-12-09 NOTE — Progress Notes (Signed)
Home visit made as planned. Asked Everlean AlstromMaurice if I could see all of his medications. Everlean AlstromMaurice went and retrieved his medications stating this is all the medications had. I began to reconcile his medications that are currently in the home with his medication profile and contacted the pharmacy to inquire about missing medications. Spoke with [facility]/Sam and he stated several of the missing medications were just delivered to the patient. Asked Everlean AlstromMaurice again if he possible had any other medication in the home. Everlean AlstromMaurice went into another room and returned with a large plastic hospital "patient belongings" bag full of medications. Some of the medications were still in [facility] packages and had never been opened. Patient has all of his medications so I filled his pillbox for 7 days and organized his medications by consolidating same medications (after verifying that strength is also the same) and placing his current regimen in a bag together. I reminded him that I will be returning next week to refill his medications for the following 7 days. We confirmed Wednesday during the same timeframe would work. Reminded Everlean AlstromMaurice on the importance of taking all of his medications and that his BP is currently elevated. Informed him that it is very important to manage his BP now so we can protect his kidneys from further damage.

## 2017-12-12 NOTE — Progress Notes (Signed)
Contacted the patient to confirm our prearranged visit for today. Patient stated declined this visit and asked for me to call him later. RN asked the patient to call me instead once he is ready for a home visit. Purpose of visit is to fill his pill box for 7 days

## 2017-12-12 NOTE — Progress Notes (Signed)
While traveling to Amani's home for our prearranged visit. I called Everlean AlstromMaurice to inform him that I am running about 5 minutes behind but will be there in about 5 minutes. Everlean AlstromMaurice stated he will not be available for a home visit and we could try again tomorrow after his dialysis treatment. I agreed to call him tomorrow

## 2017-12-12 NOTE — Progress Notes (Signed)
Planned and contacted Everlean AlstromMaurice for rescheduled visit. Everlean AlstromMaurice stated he would not available for his visit today either.

## 2017-12-19 NOTE — Telephone Encounter (Signed)
The intent of this communication is to inform the Health Care Team that this patient will be [facility] from [facility]  Greater than 3 attempts have been made to re-engage the patient  without any success . Moving forward, the [facility] will be willing to reopen the patient to services if and when the patient is ready to discuss medication adherence and HIV disease  management.  Effective 11/20/17 patient will be discharged and removed from [facility] active [facility] listing.

## 2018-01-02 ENCOUNTER — Other Ambulatory Visit: Payer: Self-pay

## 2018-01-02 DIAGNOSIS — I10 Essential (primary) hypertension: Secondary | ICD-10-CM

## 2018-01-02 MED ORDER — METOPROLOL SUCCINATE ER 25 MG PO TB24
25.0000 mg | ORAL_TABLET | Freq: Every day | ORAL | 5 refills | Status: AC
Start: 2018-01-02 — End: ?

## 2018-01-11 ENCOUNTER — Encounter (HOSPITAL_COMMUNITY): Payer: Self-pay | Admitting: Emergency Medicine

## 2018-01-11 ENCOUNTER — Other Ambulatory Visit: Payer: Self-pay

## 2018-01-11 ENCOUNTER — Emergency Department (HOSPITAL_COMMUNITY): Payer: Medicaid Other

## 2018-01-11 ENCOUNTER — Emergency Department (HOSPITAL_COMMUNITY)
Admission: EM | Admit: 2018-01-11 | Discharge: 2018-01-11 | Disposition: A | Discharge status: Home / Self Care | Payer: Medicaid Other | Attending: Physician Assistant | Admitting: Physician Assistant

## 2018-01-11 DIAGNOSIS — B20 Human immunodeficiency virus [HIV] disease: Secondary | ICD-10-CM | POA: Insufficient documentation

## 2018-01-11 DIAGNOSIS — N186 End stage renal disease: Secondary | ICD-10-CM | POA: Insufficient documentation

## 2018-01-11 DIAGNOSIS — K819 Cholecystitis, unspecified: Secondary | ICD-10-CM | POA: Diagnosis not present

## 2018-01-11 DIAGNOSIS — Z452 Encounter for adjustment and management of vascular access device: Secondary | ICD-10-CM | POA: Diagnosis present

## 2018-01-11 DIAGNOSIS — I120 Hypertensive chronic kidney disease with stage 5 chronic kidney disease or end stage renal disease: Secondary | ICD-10-CM | POA: Insufficient documentation

## 2018-01-11 DIAGNOSIS — J45909 Unspecified asthma, uncomplicated: Secondary | ICD-10-CM | POA: Insufficient documentation

## 2018-01-11 DIAGNOSIS — R1011 Right upper quadrant pain: Secondary | ICD-10-CM

## 2018-01-11 DIAGNOSIS — Z992 Dependence on renal dialysis: Secondary | ICD-10-CM | POA: Insufficient documentation

## 2018-01-11 DIAGNOSIS — F17210 Nicotine dependence, cigarettes, uncomplicated: Secondary | ICD-10-CM | POA: Diagnosis not present

## 2018-01-11 DIAGNOSIS — Z79899 Other long term (current) drug therapy: Secondary | ICD-10-CM | POA: Insufficient documentation

## 2018-01-11 LAB — COMPREHENSIVE METABOLIC PANEL
ALK PHOS: 85 U/L (ref 38–126)
ALT: 126 U/L — AB (ref 17–63)
ANION GAP: 17 — AB (ref 5–15)
AST: 196 U/L — ABNORMAL HIGH (ref 15–41)
Albumin: 3 g/dL — ABNORMAL LOW (ref 3.5–5.0)
BILIRUBIN TOTAL: 1.1 mg/dL (ref 0.3–1.2)
BUN: 63 mg/dL — ABNORMAL HIGH (ref 6–20)
CALCIUM: 8.5 mg/dL — AB (ref 8.9–10.3)
CO2: 14 mmol/L — AB (ref 22–32)
CREATININE: 12.84 mg/dL — AB (ref 0.61–1.24)
Chloride: 102 mmol/L (ref 101–111)
GFR calc non Af Amer: 4 mL/min — ABNORMAL LOW (ref >60)
GFR, EST AFRICAN AMERICAN: 5 mL/min — AB (ref >60)
Glucose, Bld: 126 mg/dL — ABNORMAL HIGH (ref 65–99)
Potassium: 4.4 mmol/L (ref 3.5–5.1)
SODIUM: 133 mmol/L — AB (ref 135–145)
TOTAL PROTEIN: 8.2 g/dL — AB (ref 6.5–8.1)

## 2018-01-11 LAB — CBC
HEMATOCRIT: 29.7 % — AB (ref 39.0–52.0)
HEMOGLOBIN: 9.9 g/dL — AB (ref 13.0–17.0)
MCH: 30.8 pg (ref 26.0–34.0)
MCHC: 33.3 g/dL (ref 30.0–36.0)
MCV: 92.5 fL (ref 78.0–100.0)
Platelets: 139 10*3/uL — ABNORMAL LOW (ref 150–400)
RBC: 3.21 MIL/uL — AB (ref 4.22–5.81)
RDW: 15.7 % — ABNORMAL HIGH (ref 11.5–15.5)
WBC: 4.8 10*3/uL (ref 4.0–10.5)

## 2018-01-11 MED ORDER — PIPERACILLIN-TAZOBACTAM 3.375 G IVPB: 3.3750 g | Freq: Three times a day (TID) | INTRAVENOUS | Status: DC

## 2018-01-11 MED ORDER — CIPROFLOXACIN HCL 500 MG PO TABS
500.0000 mg | ORAL_TABLET | Freq: Once | ORAL | Status: AC
  Administered 2018-01-11: 500 mg via ORAL
  Filled 2018-01-11: qty 1

## 2018-01-11 MED ORDER — ONDANSETRON 4 MG PO TBDP
4.0000 mg | ORAL_TABLET | Freq: Once | ORAL | Status: AC
  Administered 2018-01-11: 4 mg via ORAL
  Filled 2018-01-11: qty 1

## 2018-01-11 MED ORDER — METRONIDAZOLE 500 MG PO TABS
500.0000 mg | ORAL_TABLET | Freq: Once | ORAL | Status: AC
  Administered 2018-01-11: 500 mg via ORAL
  Filled 2018-01-11: qty 1

## 2018-01-11 MED ORDER — CIPROFLOXACIN HCL 500 MG PO TABS: 500.0000 mg | ORAL_TABLET | Freq: Every day | ORAL | 0 refills | Status: DC

## 2018-01-11 MED ORDER — METRONIDAZOLE 500 MG PO TABS: 500.0000 mg | ORAL_TABLET | Freq: Two times a day (BID) | ORAL | 0 refills | Status: DC

## 2018-01-11 NOTE — Discharge Instructions (Signed)
You have an infection in your gallbladder.  Need to be taken out.  You need surgery within the likely next 24 hours.  However you have decided to leave AGAINST MEDICAL ADVICE.  You are welcome back any time you want.  Please return as soon as you can to get the surgery while there is no good medication for this as an outpatient, we are going to be some antibiotics which may help a little bit.  This is not the cure for this disease.  You really need a surgery.  Again please come back as soon as you are willing.

## 2018-01-11 NOTE — Progress Notes (Signed)
BP 177 136. Pt states he has bp meds at home and will take them. Refuses care. Has ride waiting for him outside and is anxious to leave. Denies CP , SOB. No distress observed

## 2018-01-11 NOTE — ED Triage Notes (Signed)
Patient presents from the dialysis center where they sent him due to his right upper arm fistula being "infiltrated" and "I need an emergency chest port".  Patient c/o SOB and upset stomach.  Denies pain at this time.

## 2018-01-11 NOTE — Progress Notes (Addendum)
[facility] Brief Note  James Schwartz is a 37 y.o. Male with ESRD on HD since 04/2017  ([facility]), HTN, HIV, asthma.  Had R IJ TDC and R BC AVF placed for dialysis 05/06/17.  He has been dialyzing via his R AVF for since November and had missed multiple earlier appointments to have his dialysis catheter removed. [facility] Wednesday  [facility]. Per his report was noted to have hematoma over access and told to apply warm compress. When he presented for dialysis on Thursday, staff was unable to cannulate access d/t hematoma over site and sent home to rest AVF. Today staff still unable to cannulate and patient was sent to ED for further evaluation. Has not had dialysis since Tuesday.   Labs: Na 133 K 4.4 Cr 12.84 BUN 63 Hgb 9.9.   CXR Pending  Exam: SpO2 100% on RA, BP 156/120 HR 105   WNWD male NAD,  Lungs CTAB CV RRR No LE edema Access: RUE AVF hematoma more proximally over access good thrill/bruit below  Plan: Seen with Dr. Arlean HoppingSchertz. Patient is stable in ED, does not have urgent indications for dialysis at this time. Plan is to schedule outpatient fistulogram on Monday and follow-up at outpatient center after. [facility] and [facility]. Patient instructed to limit fluids and watch diet which he understands.   James Blasegechi Grace Ejigiri PA-C [facility] Pager [phone] 01/11/2018,4:59 PM  Pt seen, examined and agree w A/P as above.  Vinson Moselleob Olliver Boyadjian MD [facility] pager [phone]   01/12/2018, 12:24 AM

## 2018-01-11 NOTE — ED Provider Notes (Signed)
[facility] Provider Note   CSN: [id number] Arrival date & time: 01/11/18  1405     History   Chief Complaint Chief Complaint  Patient presents with  . Vascular Access Problem    HPI James Schwartz is a 37 y.o. male.  HPI  Patient is a 37 year old male with HIV positive end-stage renal disease on dialysis Tuesday Thursday Saturday.  Patient went to dialysis today and they were unable to access his fistula.  They reported infiltrated with a hematoma distally.  They sent him here to the emergency department for a chest port.  Patient had no shortness of breath.  Patient has had mild nausea with occasional body.  Mild epigastric pain  Past Medical History:  Diagnosis Date  . Asthma   . ESRD (end stage renal disease) (HCC)   . HIV (human immunodeficiency virus infection) (HCC)   . Hypertension 04/28/2017    Patient Active Problem List   Diagnosis Date Noted  . Polyneuropathy associated with underlying disease (HCC) 06/21/2017  . Dialysis patient (HCC)   . AIDS (acquired immune deficiency syndrome) (HCC)   . ESRD (end stage renal disease) (HCC)   . Hypertension 04/28/2017  . Asthma 04/28/2017  . Anemia 04/28/2017    Past Surgical History:  Procedure Laterality Date  . AV FISTULA PLACEMENT Right 05/06/2017   Procedure: ARTERIOVENOUS (AV) FISTULA CREATION RIGHT ARM;  Surgeon: Fransisco Hertzhen, Brian L, MD;  [facility];  Service: Vascular;  Laterality: Right;  . EXCHANGE OF A DIALYSIS CATHETER Right 05/06/2017   Procedure: EXCHANGE OF A DIALYSIS CATHETER RIGHT;  Surgeon: Fransisco Hertzhen, Brian L, MD;  [facility];  Service: Vascular;  Laterality: Right;  . IR FLUORO GUIDE CV LINE RIGHT  04/29/2017  . IR US GUIDE VASC ACCESS RIGHT  04/29/2017        Home Medications    Prior to Admission medications   Medication Sig Start Date End Date Taking? Authorizing Provider  Darunavir-Cobicisctat-Emtricitabine-Tenofovir Alafenamide (SYMTUZA) 800-150-200-10 MG  TABS Take 1 tablet by mouth daily with breakfast. 10/07/17  Yes Dixon, Gomez CleverlyStephanie N, NP  Dolutegravir-Rilpivirine (JULUCA) 50-25 MG TABS Take 1 tablet by mouth daily with supper. Patient taking differently: Take 1 tablet by mouth daily with breakfast.  10/07/17  Yes Dixon, Gomez CleverlyStephanie N, NP  gabapentin (NEURONTIN) 100 MG capsule TAKE 1 CAPSULE (100 MG TOTAL) BY MOUTH TWO (TWO) TIMES DAILY. 10/14/17  Yes Blanchard Kelchixon, Stephanie N, NP  multivitamin (RENA-VIT) TABS tablet Take 1 tablet by mouth at bedtime. Patient taking differently: Take 1 tablet by mouth daily with breakfast.  05/06/17  Yes Gwynn BurlyWallace, Andrew, DO  sulfamethoxazole-trimethoprim (BACTRIM,SEPTRA) 400-80 MG tablet Take 1 tablet by mouth daily. Patient taking differently: Take 1 tablet by mouth daily with breakfast.  10/07/17  Yes Blanchard Kelchixon, Stephanie N, NP  ciprofloxacin (CIPRO) 500 MG tablet Take 1 tablet (500 mg total) by mouth daily with breakfast. 01/11/18   Avaya Mcjunkins Lyn, MD  Darbepoetin Alfa (ARANESP) 100 MCG/0.5ML SOSY injection Inject 0.5 mLs (100 mcg total) into the vein every Wednesday with hemodialysis. 05/08/17   Gwynn BurlyWallace, Andrew, DO  HYDROcodone-acetaminophen (NORCO/VICODIN) 5-325 MG tablet Take 1 tablet by mouth every 4 (four) hours as needed. Patient not taking: Reported on 01/11/2018 12/03/17   Jacalyn LefevreHaviland, Julie, MD  metoprolol succinate (TOPROL XL) 25 MG 24 hr tablet Take 1 tablet (25 mg total) by mouth daily. TAKE WITH 50 MG tablet. Patient not taking: Reported on 01/11/2018 01/02/18   Blanchard Kelchixon, Stephanie N, NP  metoprolol succinate (TOPROL  XL) 50 MG 24 hr tablet Take 1 tablet (50 mg total) by mouth daily. Take with or immediately following a meal. Patient not taking: Reported on 01/11/2018 05/17/17 05/17/18  Lorenso Courier, MD  metroNIDAZOLE (FLAGYL) 500 MG tablet Take 1 tablet (500 mg total) by mouth 2 (two) times daily for 4 days. 01/11/18 01/15/18  Aiyla Baucom Lyn, MD  oseltamivir (TAMIFLU) 30 MG capsule Take 1 capsule (30 mg total) by  mouth daily. On dialysis days, take after dialysis Patient not taking: Reported on 01/11/2018 12/04/17   Jacalyn Lefevre, MD    Family History Family History  Problem Relation Age of Onset  . Diabetes Mother     Social History Social History   Tobacco Use  . Smoking status: Current Every Day Smoker    Packs/day: 0.25    Types: Cigarettes    Start date: 09/24/2002  . Smokeless tobacco: Never Used  Substance Use Topics  . Alcohol use: No  . Drug use: No     Allergies   Tomato   Review of Systems Review of Systems  Constitutional: Negative for activity change, fatigue and fever.  Respiratory: Negative for shortness of breath.   Cardiovascular: Negative for chest pain.  Gastrointestinal: Positive for abdominal pain, nausea and vomiting.  All other systems reviewed and are negative.    Physical Exam Updated Vital Signs BP (!) 177/136 (BP Location: Left Arm)   Pulse (!) 110   Temp 98.2 F (36.8 C) (Oral)   Resp 20   SpO2 100%   Physical Exam  Constitutional: He is oriented to person, place, and time. He appears well-nourished.  HENT:  Head: Normocephalic.  Eyes: Conjunctivae are normal. Right eye exhibits no discharge. Left eye exhibits no discharge.  Cardiovascular: Normal rate.  Pulmonary/Chest: Effort normal and breath sounds normal.  Abdominal: Soft. There is tenderness.  Tenderness in epigastric area.   Musculoskeletal:  Thrill present in RUE, but wider, ?hematoma, infiltrate.  Neurological: He is oriented to person, place, and time.  Skin: Skin is warm and dry. He is not diaphoretic.  Psychiatric: He has a normal mood and affect. His behavior is normal.     ED Treatments / Results  Labs (all labs ordered are listed, but only abnormal results are displayed) Labs Reviewed  CBC - Abnormal; Notable for the following components:      Result Value   RBC 3.21 (*)    Hemoglobin 9.9 (*)    HCT 29.7 (*)    RDW 15.7 (*)    Platelets 139 (*)    All other  components within normal limits  COMPREHENSIVE METABOLIC PANEL - Abnormal; Notable for the following components:   Sodium 133 (*)    CO2 14 (*)    Glucose, Bld 126 (*)    BUN 63 (*)    Creatinine, Ser 12.84 (*)    Calcium 8.5 (*)    Total Protein 8.2 (*)    Albumin 3.0 (*)    AST 196 (*)    ALT 126 (*)    GFR calc non Af Amer 4 (*)    GFR calc Af Amer 5 (*)    Anion gap 17 (*)    All other components within normal limits    EKG None  Radiology Dg Chest 2 View  Result Date: 01/11/2018 CLINICAL DATA:  Shortness of breath EXAM: CHEST - 2 VIEW COMPARISON:  12/03/2017 FINDINGS: Cardiac shadow is mildly enlarged. No vascular congestion or focal infiltrate is seen. Minimal thickening of the  major and minor fissures is noted. This is chronic in nature. No acute bony abnormality is seen. Previously seen dialysis catheter has been removed. IMPRESSION: Mild chronic pleural thickening.  No acute abnormality noted. Electronically Signed   By: Alcide Clever M.D.   On: 01/11/2018 16:59   US Abdomen Limited Ruq  Result Date: 01/11/2018 CLINICAL DATA:  Right upper quadrant pain EXAM: ULTRASOUND ABDOMEN LIMITED RIGHT UPPER QUADRANT COMPARISON:  None. FINDINGS: Gallbladder: Gallbladder is well distended with evidence of cholelithiasis. Pericholecystic fluid is noted with edema in the wall measuring 14 mm. Common bile duct: Diameter: 5 mm. Liver: No focal lesion identified. Within normal limits in parenchymal echogenicity. Portal vein is patent on color Doppler imaging with normal direction of blood flow towards the liver. Incidental note is made of right-sided pleural effusion. IMPRESSION: Changes consistent with acute calculous cholecystitis. Electronically Signed   By: Alcide Clever M.D.   On: 01/11/2018 17:17    Procedures Procedures (including critical care time)  Medications Ordered in ED Medications  piperacillin-tazobactam (ZOSYN) IVPB 3.375 g (has no administration in time range)    ciprofloxacin (CIPRO) tablet 500 mg (has no administration in time range)  metroNIDAZOLE (FLAGYL) tablet 500 mg (has no administration in time range)  ondansetron (ZOFRAN-ODT) disintegrating tablet 4 mg (4 mg Oral Given 01/11/18 1751)     Initial Impression / Assessment and Plan / ED Course  I have reviewed the triage vital signs and the nursing notes.  Pertinent labs & imaging results that were available during my care of the patient were reviewed by me and considered in my medical decision making (see chart for details).    Patient is a 37 year old male with HIV positive end-stage renal disease on dialysis Tuesday Thursday Saturday.  Patient went to dialysis today and they were unable to access his fistula.  They reported infiltrated with a hematoma distally.  They sent him here to the emergency department for a chest port.  Patient had no shortness of breath.  Patient has had mild nausea with occasional body.  Mild epigastric pain   4:18 PM Will get right upper quadrant ultrasound given elevated liver enzymes.  Will give nausea medication.  Will discuss with dialysis about next steps for chest port.  Conversation with Dr. Malachi Bonds, patient seen by him and felt that he could probably make it to Monday given his normal cane normal x-ray.  5:36 PM Unforutnately ultrasound shows acute cholecystitis.  Discussed with Dr. Magnus Ivan on for general surgery who would like a medicine to admit.    6:09 PM Patient adamantly refusing admission.  He had fused to Dr. Chales Abrahams, nephrologist.  It is apparently his birthday tomorrow and a lot of his family is coming in town.  We have expressed that it is very dangerous to leave the hospital.  It will likely end up in death, disability, worsening infection, comp gated surgeries.  He is expressed understanding to me, the nurse.  We have told him that he is welcome back at any time.  We have offered anything to keep him here and he adamantly refuses.  Will try to  make him have the best possible course by giving him some p.o. antibiotics.  However fully expressed that this is not t adequate treatment.  He needs hospitalization and surgery.  Final Clinical Impressions(s) / ED Diagnoses   Final diagnoses:  RUQ abdominal pain  ESRD (end stage renal disease) (HCC)  Cholecystitis    ED Discharge Orders  Ordered    ciprofloxacin (CIPRO) 500 MG tablet  Daily with breakfast     01/11/18 1809    metroNIDAZOLE (FLAGYL) 500 MG tablet  2 times daily     01/11/18 1809       Jennavecia Schwier, Cindee Salt, MD 01/11/18 1811

## 2018-01-13 ENCOUNTER — Emergency Department (HOSPITAL_COMMUNITY): Payer: Medicaid Other

## 2018-01-13 ENCOUNTER — Inpatient Hospital Stay (HOSPITAL_COMMUNITY)
Admission: EM | Admit: 2018-01-13 | Discharge: 2018-01-17 | DRG: 417 | Disposition: A | Discharge status: Home / Self Care | Payer: Medicaid Other | Attending: Internal Medicine | Admitting: Internal Medicine

## 2018-01-13 ENCOUNTER — Other Ambulatory Visit: Payer: Self-pay

## 2018-01-13 ENCOUNTER — Other Ambulatory Visit (HOSPITAL_COMMUNITY): Payer: Medicaid Other

## 2018-01-13 ENCOUNTER — Encounter (HOSPITAL_COMMUNITY): Payer: Self-pay

## 2018-01-13 DIAGNOSIS — Z9119 Patient's noncompliance with other medical treatment and regimen: Secondary | ICD-10-CM

## 2018-01-13 DIAGNOSIS — K219 Gastro-esophageal reflux disease without esophagitis: Secondary | ICD-10-CM | POA: Diagnosis present

## 2018-01-13 DIAGNOSIS — K810 Acute cholecystitis: Secondary | ICD-10-CM | POA: Diagnosis not present

## 2018-01-13 DIAGNOSIS — Z87891 Personal history of nicotine dependence: Secondary | ICD-10-CM | POA: Diagnosis not present

## 2018-01-13 DIAGNOSIS — J90 Pleural effusion, not elsewhere classified: Secondary | ICD-10-CM

## 2018-01-13 DIAGNOSIS — R778 Other specified abnormalities of plasma proteins: Secondary | ICD-10-CM

## 2018-01-13 DIAGNOSIS — Z992 Dependence on renal dialysis: Secondary | ICD-10-CM

## 2018-01-13 DIAGNOSIS — Z419 Encounter for procedure for purposes other than remedying health state, unspecified: Secondary | ICD-10-CM

## 2018-01-13 DIAGNOSIS — R1011 Right upper quadrant pain: Secondary | ICD-10-CM | POA: Diagnosis present

## 2018-01-13 DIAGNOSIS — J45909 Unspecified asthma, uncomplicated: Secondary | ICD-10-CM | POA: Diagnosis present

## 2018-01-13 DIAGNOSIS — N2581 Secondary hyperparathyroidism of renal origin: Secondary | ICD-10-CM | POA: Diagnosis present

## 2018-01-13 DIAGNOSIS — I2489 Other forms of acute ischemic heart disease: Secondary | ICD-10-CM | POA: Diagnosis present

## 2018-01-13 DIAGNOSIS — K8000 Calculus of gallbladder with acute cholecystitis without obstruction: Secondary | ICD-10-CM | POA: Diagnosis present

## 2018-01-13 DIAGNOSIS — Z9115 Patient's noncompliance with renal dialysis: Secondary | ICD-10-CM | POA: Diagnosis not present

## 2018-01-13 DIAGNOSIS — Z9114 Patient's other noncompliance with medication regimen: Secondary | ICD-10-CM

## 2018-01-13 DIAGNOSIS — Z79899 Other long term (current) drug therapy: Secondary | ICD-10-CM

## 2018-01-13 DIAGNOSIS — D649 Anemia, unspecified: Secondary | ICD-10-CM | POA: Diagnosis present

## 2018-01-13 DIAGNOSIS — N186 End stage renal disease: Secondary | ICD-10-CM | POA: Diagnosis present

## 2018-01-13 DIAGNOSIS — T465X6A Underdosing of other antihypertensive drugs, initial encounter: Secondary | ICD-10-CM | POA: Diagnosis present

## 2018-01-13 DIAGNOSIS — I120 Hypertensive chronic kidney disease with stage 5 chronic kidney disease or end stage renal disease: Secondary | ICD-10-CM | POA: Diagnosis present

## 2018-01-13 DIAGNOSIS — I248 Other forms of acute ischemic heart disease: Secondary | ICD-10-CM | POA: Diagnosis present

## 2018-01-13 DIAGNOSIS — K819 Cholecystitis, unspecified: Secondary | ICD-10-CM

## 2018-01-13 DIAGNOSIS — Z6834 Body mass index (BMI) 34.0-34.9, adult: Secondary | ICD-10-CM

## 2018-01-13 DIAGNOSIS — I770 Arteriovenous fistula, acquired: Secondary | ICD-10-CM

## 2018-01-13 DIAGNOSIS — Z91018 Allergy to other foods: Secondary | ICD-10-CM | POA: Diagnosis not present

## 2018-01-13 DIAGNOSIS — R0789 Other chest pain: Secondary | ICD-10-CM | POA: Diagnosis not present

## 2018-01-13 DIAGNOSIS — Z9049 Acquired absence of other specified parts of digestive tract: Secondary | ICD-10-CM | POA: Diagnosis not present

## 2018-01-13 DIAGNOSIS — E669 Obesity, unspecified: Secondary | ICD-10-CM

## 2018-01-13 DIAGNOSIS — F17210 Nicotine dependence, cigarettes, uncomplicated: Secondary | ICD-10-CM | POA: Diagnosis present

## 2018-01-13 DIAGNOSIS — E8889 Other specified metabolic disorders: Secondary | ICD-10-CM | POA: Diagnosis present

## 2018-01-13 DIAGNOSIS — B20 Human immunodeficiency virus [HIV] disease: Secondary | ICD-10-CM | POA: Diagnosis present

## 2018-01-13 DIAGNOSIS — Z978 Presence of other specified devices: Secondary | ICD-10-CM | POA: Diagnosis not present

## 2018-01-13 DIAGNOSIS — R7989 Other specified abnormal findings of blood chemistry: Secondary | ICD-10-CM

## 2018-01-13 DIAGNOSIS — F419 Anxiety disorder, unspecified: Secondary | ICD-10-CM | POA: Diagnosis present

## 2018-01-13 DIAGNOSIS — R740 Nonspecific elevation of levels of transaminase and lactic acid dehydrogenase [LDH]: Secondary | ICD-10-CM | POA: Diagnosis not present

## 2018-01-13 LAB — I-STAT CHEM 8, ED
BUN: 84 mg/dL — AB (ref 6–20)
CHLORIDE: 104 mmol/L (ref 101–111)
CREATININE: 17 mg/dL — AB (ref 0.61–1.24)
Calcium, Ion: 1.03 mmol/L — ABNORMAL LOW (ref 1.15–1.40)
GLUCOSE: 94 mg/dL (ref 65–99)
HEMATOCRIT: 28 % — AB (ref 39.0–52.0)
HEMOGLOBIN: 9.5 g/dL — AB (ref 13.0–17.0)
POTASSIUM: 5.2 mmol/L — AB (ref 3.5–5.1)
Sodium: 137 mmol/L (ref 135–145)
TCO2: 22 mmol/L (ref 22–32)

## 2018-01-13 LAB — COMPREHENSIVE METABOLIC PANEL
ALT: 191 U/L — AB (ref 17–63)
AST: 185 U/L — AB (ref 15–41)
Albumin: 2.9 g/dL — ABNORMAL LOW (ref 3.5–5.0)
Alkaline Phosphatase: 96 U/L (ref 38–126)
Anion gap: 13 (ref 5–15)
BUN: 84 mg/dL — ABNORMAL HIGH (ref 6–20)
CHLORIDE: 106 mmol/L (ref 101–111)
CO2: 18 mmol/L — AB (ref 22–32)
CREATININE: 15.89 mg/dL — AB (ref 0.61–1.24)
Calcium: 8.5 mg/dL — ABNORMAL LOW (ref 8.9–10.3)
GFR calc Af Amer: 4 mL/min — ABNORMAL LOW (ref >60)
GFR calc non Af Amer: 3 mL/min — ABNORMAL LOW (ref >60)
Glucose, Bld: 91 mg/dL (ref 65–99)
POTASSIUM: 4.9 mmol/L (ref 3.5–5.1)
SODIUM: 137 mmol/L (ref 135–145)
Total Bilirubin: 0.7 mg/dL (ref 0.3–1.2)
Total Protein: 7.5 g/dL (ref 6.5–8.1)

## 2018-01-13 LAB — CBC WITH DIFFERENTIAL/PLATELET
Basophils Absolute: 0 10*3/uL (ref 0.0–0.1)
Basophils Relative: 0 %
EOS ABS: 0.1 10*3/uL (ref 0.0–0.7)
Eosinophils Relative: 2 %
HEMATOCRIT: 28.9 % — AB (ref 39.0–52.0)
Hemoglobin: 9.7 g/dL — ABNORMAL LOW (ref 13.0–17.0)
LYMPHS ABS: 1.7 10*3/uL (ref 0.7–4.0)
LYMPHS PCT: 30 %
MCH: 31.2 pg (ref 26.0–34.0)
MCHC: 33.6 g/dL (ref 30.0–36.0)
MCV: 92.9 fL (ref 78.0–100.0)
MONOS PCT: 15 %
Monocytes Absolute: 0.8 10*3/uL (ref 0.1–1.0)
NEUTROS PCT: 53 %
Neutro Abs: 2.8 10*3/uL (ref 1.7–7.7)
Platelets: 183 10*3/uL (ref 150–400)
RBC: 3.11 MIL/uL — AB (ref 4.22–5.81)
RDW: 16.4 % — ABNORMAL HIGH (ref 11.5–15.5)
WBC: 5.5 10*3/uL (ref 4.0–10.5)

## 2018-01-13 LAB — I-STAT TROPONIN, ED: Troponin i, poc: 0.36 ng/mL (ref 0.00–0.08)

## 2018-01-13 LAB — LIPASE, BLOOD: LIPASE: 72 U/L — AB (ref 11–51)

## 2018-01-13 LAB — PROTIME-INR
INR: 1.31
Prothrombin Time: 16.2 seconds — ABNORMAL HIGH (ref 11.4–15.2)

## 2018-01-13 LAB — I-STAT CG4 LACTIC ACID, ED
LACTIC ACID, VENOUS: 1.05 mmol/L (ref 0.5–1.9)
Lactic Acid, Venous: 0.9 mmol/L (ref 0.5–1.9)

## 2018-01-13 LAB — TROPONIN I: Troponin I: 0.35 ng/mL (ref <0.03)

## 2018-01-13 MED ORDER — RILPIVIRINE HCL 25 MG PO TABS
25.0000 mg | ORAL_TABLET | Freq: Every day | ORAL | Status: DC
  Administered 2018-01-15 – 2018-01-17 (×3): 25 mg via ORAL
  Filled 2018-01-13 (×4): qty 1

## 2018-01-13 MED ORDER — PROMETHAZINE HCL 25 MG PO TABS
12.5000 mg | ORAL_TABLET | Freq: Four times a day (QID) | ORAL | Status: DC | PRN
  Administered 2018-01-13: 12.5 mg via ORAL
  Filled 2018-01-13: qty 1

## 2018-01-13 MED ORDER — DOLUTEGRAVIR SODIUM 50 MG PO TABS
50.0000 mg | ORAL_TABLET | Freq: Every day | ORAL | Status: DC
  Administered 2018-01-14 – 2018-01-17 (×4): 50 mg via ORAL
  Filled 2018-01-13 (×5): qty 1

## 2018-01-13 MED ORDER — LABETALOL HCL 5 MG/ML IV SOLN
20.0000 mg | Freq: Once | INTRAVENOUS | Status: AC
  Administered 2018-01-13: 20 mg via INTRAVENOUS
  Filled 2018-01-13: qty 4

## 2018-01-13 MED ORDER — DOLUTEGRAVIR-RILPIVIRINE 50-25 MG PO TABS: 1.0000 | ORAL_TABLET | Freq: Every day | ORAL | Status: DC

## 2018-01-13 MED ORDER — PIPERACILLIN-TAZOBACTAM 3.375 G IVPB 30 MIN
3.3750 g | Freq: Once | INTRAVENOUS | Status: AC
  Administered 2018-01-13: 3.375 g via INTRAVENOUS
  Filled 2018-01-13: qty 50

## 2018-01-13 MED ORDER — RENA-VITE PO TABS
1.0000 | ORAL_TABLET | Freq: Every day | ORAL | Status: DC
  Administered 2018-01-13 – 2018-01-16 (×4): 1 via ORAL
  Filled 2018-01-13 (×4): qty 1

## 2018-01-13 MED ORDER — HYDROMORPHONE HCL 2 MG/ML IJ SOLN
1.0000 mg | Freq: Once | INTRAMUSCULAR | Status: DC
  Filled 2018-01-13: qty 1

## 2018-01-13 MED ORDER — ACETAMINOPHEN 325 MG PO TABS
650.0000 mg | ORAL_TABLET | Freq: Four times a day (QID) | ORAL | Status: DC | PRN
  Administered 2018-01-14 – 2018-01-16 (×4): 650 mg via ORAL
  Filled 2018-01-13 (×3): qty 2

## 2018-01-13 MED ORDER — DARUN-COBIC-EMTRICIT-TENOFAF 800-150-200-10 MG PO TABS
1.0000 | ORAL_TABLET | Freq: Every day | ORAL | Status: DC
  Administered 2018-01-14 – 2018-01-17 (×4): 1 via ORAL
  Filled 2018-01-13 (×4): qty 1

## 2018-01-13 MED ORDER — SULFAMETHOXAZOLE-TRIMETHOPRIM 400-80 MG PO TABS
1.0000 | ORAL_TABLET | Freq: Every day | ORAL | Status: DC
  Filled 2018-01-13: qty 1

## 2018-01-13 MED ORDER — ONDANSETRON HCL 4 MG/2ML IJ SOLN: 4.0000 mg | Freq: Once | INTRAMUSCULAR | Status: DC

## 2018-01-13 MED ORDER — LABETALOL HCL 5 MG/ML IV SOLN
10.0000 mg | Freq: Once | INTRAVENOUS | Status: AC
  Administered 2018-01-13: 10 mg via INTRAVENOUS
  Filled 2018-01-13: qty 4

## 2018-01-13 MED ORDER — ACETAMINOPHEN 650 MG RE SUPP: 650.0000 mg | Freq: Four times a day (QID) | RECTAL | Status: DC | PRN

## 2018-01-13 MED ORDER — PROMETHAZINE HCL 25 MG/ML IJ SOLN
12.5000 mg | Freq: Four times a day (QID) | INTRAMUSCULAR | Status: DC | PRN
Start: 2018-01-13 — End: 2018-01-17
  Administered 2018-01-16: 12.5 mg via INTRAVENOUS

## 2018-01-13 NOTE — ED Triage Notes (Signed)
Pt arrives to ED from home with complaints of chest pain since this morning. EMS reports pt is dialysis pt, has had recent problems with dialysis access and has not had dialysis in 7 days. Pt was seen at the hospital last week "with some lower abdominal pain and they told me I might have a UTI but could need surgery to take something out?". Pt placed in position of comfort with bed locked and lowered, call bell in reach.

## 2018-01-13 NOTE — H&P (Signed)
Date: 01/13/2018               Patient Name:  James Schwartz MRN: [id number]  DOB: [birth year] Age / Sex: 37 y.o., male   PCP: Patient, No Pcp Per         Medical Service: Internal Medicine Teaching Service         Attending Physician: Dr. Sandre Kitty, Elwin Mocha, MD    First Contact: Dr. Alinda Money Pager: [phone]  Second Contact: Dr. Obie Dredge Pager: [phone]       After Hours (After 5p/  First Contact Pager: [phone]  weekends / holidays): Second Contact Pager: [phone]   Chief Complaint: abdominal pain   History of Present Illness:  37 yo male PMH of HIV/AIDS, ESRD on HD TThS, HTN, and asthma presenting with abdominal pain and chest pain. Patient states he has had progressively worsening abdominal pain for one week duration, which is what brought him to the ED. Patient seen in [facility] on 4/20 for infiltrated fistula site. At that time he was also complaining of nausea and mild epigastric pain. RUQ ultrasound was performed, which showed acute cholecystitis. The patient declined admission at that time. He describes the abdominal pain as cramping in quality, 8/10 in severity, it is diffuse and intermittent. He denies fevers, chills, nausea, vomiting, diarrhea, dysuria, or hematuria. He denies association with eating and denies decreased PO intake since his symptoms first started.   The patient's chest pain started this morning while he was sitting/resting. He describes the pain as throbbing, located on his left anterior lateral chest wall, non-radiating. The pain lasted for several hours. It is not associated with shortness of breath or diaphoresis. It is not worsened by exertion. He has never had this type of pain before. He received 324 mg of ASA and 2 SL nitros via EMS. He is not currently experiencing CP.   Patient has not attended HD in one week. States he has not gone because he had not been feeling well. Does endorse some mild dyspnea on exertion. No lower extremity swelling or orthopnea.    Patient also admits to inconsistent use of his HAART. He will forget to take several doses in a given week. States he was last seen in ID clinic 1 month ago, but per chart review has not been seen sine January 2019. At that time, he was non-adherent with his medications.   ED Course: Vitals: Blood pressure (!) 158/119, pulse 94, temperature 98.7 F (37.1 C),  resp. Rate 20,  SpO2 96 % on RA. Labs: K 4.9, CO2 18, AG 13, ALT 191, AST 185; WBC 5.5, Hgb 9.7; lipase 72, I-stat troponin 0.36, Troponin I 0.35; LA 0.9, 1.05 Meds: labetalol 30 mg IV, Zosyn Imaging: RUQ U/S 2.4 cm gallstone lodged in the gallbladder neck. Marked gallbladder wall thickening measuring up to 1.4 cm,  consistent with acute cholecystitis. Consults: Gen surg, nephrology  Meds:  Current Meds  Medication Sig  . ciprofloxacin (CIPRO) 500 MG tablet Take 1 tablet (500 mg total) by mouth daily with breakfast.  . Darbepoetin Alfa (ARANESP) 100 MCG/0.5ML SOSY injection Inject 0.5 mLs (100 mcg total) into the vein every Wednesday with hemodialysis.  . Darunavir-Cobicisctat-Emtricitabine-Tenofovir Alafenamide (SYMTUZA) 800-150-200-10 MG TABS Take 1 tablet by mouth daily with breakfast.  . Dolutegravir-Rilpivirine (JULUCA) 50-25 MG TABS Take 1 tablet by mouth daily with supper. (Patient taking differently: Take 1 tablet by mouth daily with breakfast. )  . gabapentin (NEURONTIN) 100 MG capsule TAKE 1 CAPSULE (100 MG  TOTAL) BY MOUTH TWO (TWO) TIMES DAILY.  Marland Kitchen HYDROcodone-acetaminophen (NORCO/VICODIN) 5-325 MG tablet Take 1 tablet by mouth every 4 (four) hours as needed.  . metoprolol succinate (TOPROL XL) 25 MG 24 hr tablet Take 1 tablet (25 mg total) by mouth daily. TAKE WITH 50 MG tablet.  . metoprolol succinate (TOPROL XL) 50 MG 24 hr tablet Take 1 tablet (50 mg total) by mouth daily. Take with or immediately following a meal.  . metroNIDAZOLE (FLAGYL) 500 MG tablet Take 1 tablet (500 mg total) by mouth 2 (two) times daily for 4  days.  . multivitamin (RENA-VIT) TABS tablet Take 1 tablet by mouth at bedtime. (Patient taking differently: Take 1 tablet by mouth daily with breakfast. )  . oseltamivir (TAMIFLU) 30 MG capsule Take 1 capsule (30 mg total) by mouth daily. On dialysis days, take after dialysis  . sulfamethoxazole-trimethoprim (BACTRIM,SEPTRA) 400-80 MG tablet Take 1 tablet by mouth daily. (Patient taking differently: Take 1 tablet by mouth daily with breakfast. )     Allergies: Allergies as of 01/13/2018 - Review Complete 01/13/2018  Allergen Reaction Noted  . Tomato Other (See Comments) 04/28/2017   Past Medical History:  Diagnosis Date  . Asthma   . ESRD (end stage renal disease) (HCC)   . HIV (human immunodeficiency virus infection) (HCC)   . Hypertension 04/28/2017    Family History:  Family History  Problem Relation Age of Onset  . Diabetes Mother     Social History:  Social History   Tobacco Use  . Smoking status: Current Every Day Smoker    Packs/day: 0.25    Types: Cigarettes    Start date: 09/24/2002  . Smokeless tobacco: Never Used  Substance Use Topics  . Alcohol use: No  . Drug use: No    Review of Systems: A complete ROS was negative except as per HPI.   Physical Exam: Blood pressure (!) 138/93, pulse 83, temperature 98.7 F (37.1 C), temperature source Oral, resp. rate (!) 29, height 6\' 1"  (1.854 m), weight 260 lb (117.9 kg), SpO2 97 %. Physical Exam  Constitutional: He is oriented to person, place, and time. He appears well-developed and well-nourished. No distress.  HENT:  Head: Normocephalic and atraumatic.  Mouth/Throat: Oropharynx is clear and moist.  Eyes: Conjunctivae are normal. No scleral icterus.  Neck: Normal range of motion. Neck supple. No JVD present.  Cardiovascular: Normal rate, regular rhythm and normal heart sounds.  Pulmonary/Chest: Effort normal and breath sounds normal. No respiratory distress. He has no wheezes. He has no rales. He exhibits  tenderness (left anterior chest wall TTP).  Abdominal: Soft. Bowel sounds are normal. He exhibits no distension. There is generalized tenderness (diffuse tenderness). There is no rebound.  Musculoskeletal: Normal range of motion. He exhibits no edema.  RUE fistula with palpable and audible thrill   Neurological: He is alert and oriented to person, place, and time. No cranial nerve deficit.  Skin: Skin is warm and dry.    EKG: personally reviewed my interpretation is sinus rhythm, T wave inversions in V5, V6; no ST segment elevations or depressions  CXR: personally reviewed my interpretation is mild interstitial edema; negative for focal opacity, consolidation, or pleural effusion   Assessment & Plan by Problem: Active Problems:   Acute cholecystitis   Acute cholecystitis due to biliary calculus  Acute Cholecystitis  Patient presenting with 6 days of abdominal pain and ED visit 2 day ago for progressively worsening abdominal pain. At that time, RUQ ultrasound was consistent  with acute cholecystitis, but the patient declined admission. Repeat RUQ U/S today re-demonstrated gall bladder wall thickening with new 2.4 cm gallstone lodged in the gallbladder neck, consistent with acute cholecystitis. Patient also has elevated transaminases. He is currently afebrile and hemodynamically stable. He has been seen and evaluated by surgery who started the patient on zosyn.  -General surgery consulted - considering percutaneous cholecystostomy tube -NPO at midnight  -Holding pharmacological DVT ppx for possible surgical procedures tomorrow -Continue Zosyn per pharmacy -Repeat CBC and renal funciton panel in AM  Chest Pain  Patient presenting with chest pain of atypical and typical features. The quality and duration of the pain is atypical, but relief by nitro is a typical feature. Patient's risk factors include smoking history, HIV, and obesity. Given patient's age and history provided less likely ACS, but  elevated Troponin to 0.35 warrants serial monitoring. EKG without ST segment elevations, but with new T wave inversions in leads V5 and V6. He is currently chest pain free.  -Serial Troponin monitoring  -Cardiac telemetry   HIV/AIDS Diagnosed in 2002. History of medication non-adherence. Current regimen includes: Symtuza (Darunavir-Cobicisctat-Emtricitabine-Tenofovir Alafenamide), Juluca (Dolutegravir-Rilpivirine), and Bactrim. Most recent CD4 count 155, VL 6870.  -Will continue home medications  -Surgery requesting ID consult and CD4 count - will defer to day team   ESRD on HD TThS Has not attended dialysis for 6 days, last HD session 4/16. On HD since 04/2017. K 4.9 on admission. CXR with mild pulmonary edema, appears euvolemic on examination.  -Nephrology consulted, appreciate recommendations   HTN BP elevated 140-170 systolics, diastolic ranging from 100-130. Received IV metoprolol in ED. Improved BP slightly, currently 138/93. Home regimen includes metoprolol succinate 75 mg daily. Unclear if patient has been taking his BP medications.  -BP will likely improve with HD -Will continue to monitor  DVT ppx: holding for possible surgical intervention in AM  Dispo: Admit patient to Inpatient with expected length of stay greater than 2 midnights.  Signed: Toney RakesLacroce, Samantha J, MD 01/13/2018, 9:15 PM  Pager: [phone]

## 2018-01-13 NOTE — ED Notes (Signed)
Pt states that his fistula in his right arm is clogged.

## 2018-01-13 NOTE — H&P (Signed)
CC: Consult by Dr. Ellender Hose for evaluation of possible acute cholecystitis  HPI: James Schwartz is an 37 y.o. male with hx of hypertension, HIV (poor compliance with HAART therapy, last CD4 count 155 07/2017), ESRD on dialysis via right arm AVF, presents to the emergency room with 6-day history of right upper quadrant discomfort.  He actually came in the emergency room for evaluation of left-sided chest pain but noted 6 days of right upper quadrant abdominal pain as well.  He was seen here for right-sided abdominal discomfort 4/20 but left on his own accord.  At that time he was diagnosed with acute cholecystitis.  He returned today with the same symptoms.  His pain is sharp, radiates to his mid epigastrium, nothing makes it better or worse.  Prior to 6 days ago he had never had this pain before.  He denies fever/chills/nausea/vomiting.  He reports being hungry currently and is requesting food.  His last dialysis session was 6 days ago.  He reports taking his HIV medications but missing a couple days per week.  By documentation in the chart, he has been discharged from [facility] for medication noncompliance and hasn't seen an infectious ID physician in a couple of months.   Past Medical History:  Diagnosis Date  . Asthma   . ESRD (end stage renal disease) ([place])   . HIV (human immunodeficiency virus infection) ([place])   . Hypertension 04/28/2017    Past Surgical History:  Procedure Laterality Date  . AV FISTULA PLACEMENT Right 05/06/2017   Procedure: ARTERIOVENOUS (AV) FISTULA CREATION RIGHT ARM;  Surgeon: Conrad Plainview, MD;  Location: [facility];  Service: Vascular;  Laterality: Right;  . EXCHANGE OF A DIALYSIS CATHETER Right 05/06/2017   Procedure: EXCHANGE OF A DIALYSIS CATHETER RIGHT;  Surgeon: Conrad Hummelstown, MD;  Location: [facility];  Service: Vascular;  Laterality: Right;  . IR FLUORO GUIDE CV LINE RIGHT  04/29/2017  . IR US GUIDE VASC ACCESS RIGHT  04/29/2017     Family History  Problem Relation Age of Onset  . Diabetes Mother     Social:  reports that he has been smoking cigarettes.  He started smoking about 15 years ago. He has been smoking about 0.25 packs per day. He has never used smokeless tobacco. He reports that he does not drink alcohol or use drugs.  Allergies:  Allergies  Allergen Reactions  . Tomato Other (See Comments)    Mouth sores    Medications: I have reviewed the patient's current medications.  Results for orders placed or performed during the hospital encounter of 01/13/18 (from the past 48 hour(s))  Troponin I     Status: Abnormal   Collection Time: 01/13/18  3:52 PM  Result Value Ref Range   Troponin I 0.35 (HH) <0.03 ng/mL    Comment: CRITICAL RESULT CALLED TO, READ BACK BY AND VERIFIED WITH: C CLEMMER,RN 1736 01/13/18 D BRADLEY Performed at [facility] [address], [place], [place] [address]   Comprehensive metabolic panel     Status: Abnormal   Collection Time: 01/13/18  3:52 PM  Result Value Ref Range   Sodium 137 135 - 145 mmol/L   Potassium 4.9 3.5 - 5.1 mmol/L   Chloride 106 101 - 111 mmol/L   CO2 18 (L) 22 - 32 mmol/L   Glucose, Bld 91 65 - 99 mg/dL   BUN 84 (H) 6 - 20 mg/dL   Creatinine, Ser 15.89 (H) 0.61 - 1.24  mg/dL   Calcium 8.5 (L) 8.9 - 10.3 mg/dL   Total Protein 7.5 6.5 - 8.1 g/dL   Albumin 2.9 (L) 3.5 - 5.0 g/dL   AST 185 (H) 15 - 41 U/L   ALT 191 (H) 17 - 63 U/L   Alkaline Phosphatase 96 38 - 126 U/L   Total Bilirubin 0.7 0.3 - 1.2 mg/dL   GFR calc non Af Amer 3 (L) >60 mL/min   GFR calc Af Amer 4 (L) >60 mL/min    Comment: (NOTE) The eGFR has been calculated using the CKD EPI equation. This calculation has not been validated in all clinical situations. eGFR's persistently <60 mL/min signify possible Chronic Kidney Disease.    Anion gap 13 5 - 15    Comment: Performed at [facility] [address], [place], [place] [address]  CBC with Differential     Status:  Abnormal   Collection Time: 01/13/18  3:52 PM  Result Value Ref Range   WBC 5.5 4.0 - 10.5 K/uL   RBC 3.11 (L) 4.22 - 5.81 MIL/uL   Hemoglobin 9.7 (L) 13.0 - 17.0 g/dL   HCT 28.9 (L) 39.0 - 52.0 %   MCV 92.9 78.0 - 100.0 fL   MCH 31.2 26.0 - 34.0 pg   MCHC 33.6 30.0 - 36.0 g/dL   RDW 16.4 (H) 11.5 - 15.5 %   Platelets 183 150 - 400 K/uL   Neutrophils Relative % 53 %   Neutro Abs 2.8 1.7 - 7.7 K/uL   Lymphocytes Relative 30 %   Lymphs Abs 1.7 0.7 - 4.0 K/uL   Monocytes Relative 15 %   Monocytes Absolute 0.8 0.1 - 1.0 K/uL   Eosinophils Relative 2 %   Eosinophils Absolute 0.1 0.0 - 0.7 K/uL   Basophils Relative 0 %   Basophils Absolute 0.0 0.0 - 0.1 K/uL    Comment: Performed at [facility] [address], [place], [place] [address]  Lipase, blood     Status: Abnormal   Collection Time: 01/13/18  3:52 PM  Result Value Ref Range   Lipase 72 (H) 11 - 51 U/L    Comment: Performed at [facility] [address], [place], [place] [address]  Protime-INR     Status: Abnormal   Collection Time: 01/13/18  3:52 PM  Result Value Ref Range   Prothrombin Time 16.2 (H) 11.4 - 15.2 seconds   INR 1.31     Comment: Performed at [facility] [address], [place], [place] [address]  I-Stat Troponin, ED (not at [facility])     Status: Abnormal   Collection Time: 01/13/18  3:59 PM  Result Value Ref Range   Troponin i, poc 0.36 (HH) 0.00 - 0.08 ng/mL   Comment NOTIFIED PHYSICIAN    Comment 3            Comment: Due to the release kinetics of cTnI, a negative result within the first hours of the onset of symptoms does not rule out myocardial infarction with certainty. If myocardial infarction is still suspected, repeat the test at appropriate intervals.   I-Stat Chem 8, ED     Status: Abnormal   Collection Time: 01/13/18  4:00 PM  Result Value Ref Range   Sodium 137 135 - 145 mmol/L   Potassium 5.2 (H) 3.5 - 5.1 mmol/L   Chloride 104 101 - 111 mmol/L   BUN 84 (H) 6 - 20  mg/dL   Creatinine,  Ser 17.00 (H) 0.61 - 1.24 mg/dL   Glucose, Bld 94 65 - 99 mg/dL   Calcium, Ion 1.03 (L) 1.15 - 1.40 mmol/L   TCO2 22 22 - 32 mmol/L   Hemoglobin 9.5 (L) 13.0 - 17.0 g/dL   HCT 28.0 (L) 39.0 - 52.0 %  I-Stat CG4 Lactic Acid, ED     Status: None   Collection Time: 01/13/18  4:01 PM  Result Value Ref Range   Lactic Acid, Venous 0.90 0.5 - 1.9 mmol/L    Dg Chest 2 View  Result Date: 01/13/2018 CLINICAL DATA:  Left-sided chest pain. EXAM: CHEST - 2 VIEW COMPARISON:  01/11/2018 FINDINGS: Stable moderate cardiac enlargement. Mild amount of fissural thickening again noted in the lateral projection, likely reflecting a small amount of pleural fluid in the fissures. There may be mild pulmonary interstitial edema bilaterally. There is no evidence of airspace edema, consolidation, pneumothorax or nodule. IMPRESSION: Cardiac enlargement and possible mild pulmonary interstitial edema. Electronically Signed   By: Aletta Edouard M.D.   On: 01/13/2018 16:20   US Abdomen Limited Ruq  Result Date: 01/13/2018 CLINICAL DATA:  37 year old male with right upper quadrant pain for the past week. Subsequent encounter. EXAM: ULTRASOUND ABDOMEN LIMITED RIGHT UPPER QUADRANT COMPARISON:  01/11/2018 ultrasound. FINDINGS: Gallbladder: 2.4 cm gallstone lodged in the gallbladder neck. Marked gallbladder wall thickening measuring up to 1.4 cm. Patient was tender over the gallbladder during scanning. Common bile duct: Diameter: 3.3 mm. Liver: Heterogeneous without focal mass. Portal vein is patent on color Doppler imaging with normal direction of blood flow towards the liver. Small right pleural effusion. Right renal parenchyma increased echogenicity consistent with patient's history of renal failure. IMPRESSION: 2.4 cm gallstone lodged in the gallbladder neck. Marked gallbladder wall thickening measuring up to 1.4 cm (as previously noted). Patient was tender over the gallbladder during scanning. Findings  consistent with acute cholecystitis. Small right-sided pleural effusion. Right renal parenchyma increased echogenicity consistent with patient's history of renal failure. These results were called by telephone at the time of interpretation on 01/13/2018 at 5:43 pm to Dr. Duffy Bruce , who verbally acknowledged these results. Electronically Signed   By: Genia Del M.D.   On: 01/13/2018 17:46    ROS - all of the below systems have been reviewed with the patient and positives are indicated with bold text General: chills, fever or night sweats Eyes: blurry vision or double vision ENT: epistaxis or sore throat Allergy/Immunology: itchy/watery eyes or nasal congestion Hematologic/Lymphatic: bleeding problems, blood clots or swollen lymph nodes Endocrine: temperature intolerance or unexpected weight changes Breast: new or changing breast lumps or nipple discharge Resp: cough, shortness of breath, or wheezing CV: chest pain or dyspnea on exertion GI: as per HPI GU: dysuria, trouble voiding, or hematuria MSK: joint pain or joint stiffness Neuro: TIA or stroke symptoms Derm: pruritus and skin lesion changes Psych: anxiety and depression  PE Blood pressure (!) 154/126, pulse 93, temperature 98.7 F (37.1 C), temperature source Oral, resp. rate (!) 22, height 6' 1" (1.854 m), weight 117.9 kg (260 lb), SpO2 (!) 88 %. Constitutional: NAD; conversant; no deformities Eyes: Moist conjunctiva; no lid lag; anicteric; PERRL Neck: Trachea midline; no thyromegaly Lungs: Normal respiratory effort; no tactile fremitus CV: RRR; no palpable thrills; no pitting edema GI: Abd soft, nondistended; moderately ttp in RUQ; no palpable hepatosplenomegaly; no rebound/guarding. MSK: Normal gait; no clubbing/cyanosis Psychiatric: Appropriate affect; alert and oriented x3 Lymphatic: No palpable cervical or axillary lymphadenopathy  Results for orders placed  or performed during the hospital encounter of 01/13/18  (from the past 48 hour(s))  Troponin I     Status: Abnormal   Collection Time: 01/13/18  3:52 PM  Result Value Ref Range   Troponin I 0.35 (HH) <0.03 ng/mL    Comment: CRITICAL RESULT CALLED TO, READ BACK BY AND VERIFIED WITH: C CLEMMER,RN 1736 01/13/18 D BRADLEY Performed at [facility] [address], [place], [place] [address]   Comprehensive metabolic panel     Status: Abnormal   Collection Time: 01/13/18  3:52 PM  Result Value Ref Range   Sodium 137 135 - 145 mmol/L   Potassium 4.9 3.5 - 5.1 mmol/L   Chloride 106 101 - 111 mmol/L   CO2 18 (L) 22 - 32 mmol/L   Glucose, Bld 91 65 - 99 mg/dL   BUN 84 (H) 6 - 20 mg/dL   Creatinine, Ser 15.89 (H) 0.61 - 1.24 mg/dL   Calcium 8.5 (L) 8.9 - 10.3 mg/dL   Total Protein 7.5 6.5 - 8.1 g/dL   Albumin 2.9 (L) 3.5 - 5.0 g/dL   AST 185 (H) 15 - 41 U/L   ALT 191 (H) 17 - 63 U/L   Alkaline Phosphatase 96 38 - 126 U/L   Total Bilirubin 0.7 0.3 - 1.2 mg/dL   GFR calc non Af Amer 3 (L) >60 mL/min   GFR calc Af Amer 4 (L) >60 mL/min    Comment: (NOTE) The eGFR has been calculated using the CKD EPI equation. This calculation has not been validated in all clinical situations. eGFR's persistently <60 mL/min signify possible Chronic Kidney Disease.    Anion gap 13 5 - 15    Comment: Performed at [facility] [address], [place], [place] [address]  CBC with Differential     Status: Abnormal   Collection Time: 01/13/18  3:52 PM  Result Value Ref Range   WBC 5.5 4.0 - 10.5 K/uL   RBC 3.11 (L) 4.22 - 5.81 MIL/uL   Hemoglobin 9.7 (L) 13.0 - 17.0 g/dL   HCT 28.9 (L) 39.0 - 52.0 %   MCV 92.9 78.0 - 100.0 fL   MCH 31.2 26.0 - 34.0 pg   MCHC 33.6 30.0 - 36.0 g/dL   RDW 16.4 (H) 11.5 - 15.5 %   Platelets 183 150 - 400 K/uL   Neutrophils Relative % 53 %   Neutro Abs 2.8 1.7 - 7.7 K/uL   Lymphocytes Relative 30 %   Lymphs Abs 1.7 0.7 - 4.0 K/uL   Monocytes Relative 15 %   Monocytes Absolute 0.8 0.1 - 1.0 K/uL   Eosinophils  Relative 2 %   Eosinophils Absolute 0.1 0.0 - 0.7 K/uL   Basophils Relative 0 %   Basophils Absolute 0.0 0.0 - 0.1 K/uL    Comment: Performed at [facility] [address], [place], [place] [address]  Lipase, blood     Status: Abnormal   Collection Time: 01/13/18  3:52 PM  Result Value Ref Range   Lipase 72 (H) 11 - 51 U/L    Comment: Performed at [facility] [address], [place], [place] [address]  Protime-INR     Status: Abnormal   Collection Time: 01/13/18  3:52 PM  Result Value Ref Range   Prothrombin Time 16.2 (H) 11.4 - 15.2 seconds   INR 1.31     Comment: Performed at [facility] [address] [place], [place] [address]  I-Stat Troponin, ED (not at [facility])     Status: Abnormal   Collection Time: 01/13/18  3:59 PM  Result Value Ref Range   Troponin i, poc 0.36 (HH) 0.00 - 0.08 ng/mL   Comment NOTIFIED PHYSICIAN    Comment 3            Comment: Due to the release kinetics of cTnI, a negative result within the first hours of the onset of symptoms does not rule out myocardial infarction with certainty. If myocardial infarction is still suspected, repeat the test at appropriate intervals.   I-Stat Chem 8, ED     Status: Abnormal   Collection Time: 01/13/18  4:00 PM  Result Value Ref Range   Sodium 137 135 - 145 mmol/L   Potassium 5.2 (H) 3.5 - 5.1 mmol/L   Chloride 104 101 - 111 mmol/L   BUN 84 (H) 6 - 20 mg/dL   Creatinine, Ser 17.00 (H) 0.61 - 1.24 mg/dL   Glucose, Bld 94 65 - 99 mg/dL   Calcium, Ion 1.03 (L) 1.15 - 1.40 mmol/L   TCO2 22 22 - 32 mmol/L   Hemoglobin 9.5 (L) 13.0 - 17.0 g/dL   HCT 28.0 (L) 39.0 - 52.0 %  I-Stat CG4 Lactic Acid, ED     Status: None   Collection Time: 01/13/18  4:01 PM  Result Value Ref Range   Lactic Acid, Venous 0.90 0.5 - 1.9 mmol/L    Dg Chest 2 View  Result Date: 01/13/2018 CLINICAL DATA:  Left-sided chest pain. EXAM: CHEST - 2 VIEW COMPARISON:  01/11/2018 FINDINGS: Stable moderate cardiac enlargement.  Mild amount of fissural thickening again noted in the lateral projection, likely reflecting a small amount of pleural fluid in the fissures. There may be mild pulmonary interstitial edema bilaterally. There is no evidence of airspace edema, consolidation, pneumothorax or nodule. IMPRESSION: Cardiac enlargement and possible mild pulmonary interstitial edema. Electronically Signed   By: Aletta Edouard M.D.   On: 01/13/2018 16:20   US Abdomen Limited Ruq  Result Date: 01/13/2018 CLINICAL DATA:  37 year old male with right upper quadrant pain for the past week. Subsequent encounter. EXAM: ULTRASOUND ABDOMEN LIMITED RIGHT UPPER QUADRANT COMPARISON:  01/11/2018 ultrasound. FINDINGS: Gallbladder: 2.4 cm gallstone lodged in the gallbladder neck. Marked gallbladder wall thickening measuring up to 1.4 cm. Patient was tender over the gallbladder during scanning. Common bile duct: Diameter: 3.3 mm. Liver: Heterogeneous without focal mass. Portal vein is patent on color Doppler imaging with normal direction of blood flow towards the liver. Small right pleural effusion. Right renal parenchyma increased echogenicity consistent with patient's history of renal failure. IMPRESSION: 2.4 cm gallstone lodged in the gallbladder neck. Marked gallbladder wall thickening measuring up to 1.4 cm (as previously noted). Patient was tender over the gallbladder during scanning. Findings consistent with acute cholecystitis. Small right-sided pleural effusion. Right renal parenchyma increased echogenicity consistent with patient's history of renal failure. These results were called by telephone at the time of interpretation on 01/13/2018 at 5:43 pm to Dr. Duffy Bruce , who verbally acknowledged these results. Electronically Signed   By: Genia Del M.D.   On: 01/13/2018 17:46    A/P: Shaquile Lutze is an 37 y.o. male with HTN, ESRD, HIV/AIDS here today with likely acute/subacute cholecystitis  -Agree with medicine consultation for  admission.  We would additionally request that infectious disease be consulted for HIV/AIDS disease evaluation; would repeat CD4 count -Nephrology consultation for dialysis -IV Zosyn -renally dosed as per nephrology -NPO after midnight  in the event that further procedures need to be done tomorrow (possible percutaneous cholecystostomy tube?) -Further treatment recommendations will be made based on the above -I discussed the anatomy and physiology of the hepatobiliary tract with the patient and pathophysiology of gallstones and cholecystitis.  We discussed the treatment approach and that additional data would be necessary for any further treatment decisions were made.  His questions were answered, he voiced understanding, and was in agreement with the plan  Sharon Mt. Dema Severin, M.D. [facility]

## 2018-01-13 NOTE — ED Notes (Signed)
Regular diet dinner tray ordered 

## 2018-01-13 NOTE — ED Provider Notes (Signed)
[facility] Provider Note   CSN: [id number] Arrival date & time: 01/13/18  1328     History   Chief Complaint Chief Complaint  Patient presents with  . Chest Pain    HPI James Schwartz is a 37 y.o. male.  HPI  37 year old male with history of HIV AIDS, end-stage renal disease, here with right upper quadrant pain, nausea, and left-sided chest pain.  The patient was just seen on 4/20 for similar symptoms.  Was diagnosed with acute cholecystitis at that time but left AMA.  States that since then, has had persistent nausea as well as a dull, aching, throbbing, left upper chest pain.  Has had associated cough with right upper quadrant pain when he coughs.  He has had persistent nausea and poor appetite and has been unable to eat or drink.  He has not had any change in his bowel movements.  Denies any fevers.  Of note, he had a hematoma at his fistula and has not received dialysis in the last week.  Denies any cramping.  Denies any lower extremity swelling.  No shortness of breath.  He states he is now amenable to admission if needed.  He has not been taking his blood pressure medications at home.   Past Medical History:  Diagnosis Date  . Asthma   . ESRD (end stage renal disease) (HCC)   . HIV (human immunodeficiency virus infection) (HCC)   . Hypertension 04/28/2017    Patient Active Problem List   Diagnosis Date Noted  . Polyneuropathy associated with underlying disease (HCC) 06/21/2017  . Dialysis patient (HCC)   . AIDS (acquired immune deficiency syndrome) (HCC)   . ESRD (end stage renal disease) (HCC)   . Hypertension 04/28/2017  . Asthma 04/28/2017  . Anemia 04/28/2017    Past Surgical History:  Procedure Laterality Date  . AV FISTULA PLACEMENT Right 05/06/2017   Procedure: ARTERIOVENOUS (AV) FISTULA CREATION RIGHT ARM;  Surgeon: Fransisco Hertzhen, Brian L, MD;  [facility];  Service: Vascular;  Laterality: Right;  . EXCHANGE OF A DIALYSIS  CATHETER Right 05/06/2017   Procedure: EXCHANGE OF A DIALYSIS CATHETER RIGHT;  Surgeon: Fransisco Hertzhen, Brian L, MD;  [facility];  Service: Vascular;  Laterality: Right;  . IR FLUORO GUIDE CV LINE RIGHT  04/29/2017  . IR US GUIDE VASC ACCESS RIGHT  04/29/2017        Home Medications    Prior to Admission medications   Medication Sig Start Date End Date Taking? Authorizing Provider  ciprofloxacin (CIPRO) 500 MG tablet Take 1 tablet (500 mg total) by mouth daily with breakfast. 01/11/18  Yes Mackuen, Courteney Lyn, MD  Darbepoetin Alfa (ARANESP) 100 MCG/0.5ML SOSY injection Inject 0.5 mLs (100 mcg total) into the vein every Wednesday with hemodialysis. 05/08/17  Yes Gwynn BurlyWallace, Andrew, DO  Darunavir-Cobicisctat-Emtricitabine-Tenofovir Alafenamide [facility](SYMTUZA) 800-150-200-10 MG TABS Take 1 tablet by mouth daily with breakfast. 10/07/17  Yes Dixon, Gomez CleverlyStephanie N, NP  Dolutegravir-Rilpivirine (JULUCA) 50-25 MG TABS Take 1 tablet by mouth daily with supper. Patient taking differently: Take 1 tablet by mouth daily with breakfast.  10/07/17  Yes Dixon, Gomez CleverlyStephanie N, NP  gabapentin (NEURONTIN) 100 MG capsule TAKE 1 CAPSULE (100 MG TOTAL) BY MOUTH TWO (TWO) TIMES DAILY. 10/14/17  Yes Blanchard Kelchixon, Stephanie N, NP  HYDROcodone-acetaminophen (NORCO/VICODIN) 5-325 MG tablet Take 1 tablet by mouth every 4 (four) hours as needed. 12/03/17  Yes Jacalyn LefevreHaviland, Julie, MD  metoprolol succinate (TOPROL XL) 25 MG 24 hr tablet Take 1 tablet (  25 mg total) by mouth daily. TAKE WITH 50 MG tablet. 01/02/18  Yes Blanchard Kelch, NP  metoprolol succinate (TOPROL XL) 50 MG 24 hr tablet Take 1 tablet (50 mg total) by mouth daily. Take with or immediately following a meal. 05/17/17 05/17/18 Yes Chundi, Vahini, MD  metroNIDAZOLE (FLAGYL) 500 MG tablet Take 1 tablet (500 mg total) by mouth 2 (two) times daily for 4 days. 01/11/18 01/15/18 Yes Mackuen, Courteney Lyn, MD  multivitamin (RENA-VIT) TABS tablet Take 1 tablet by mouth at bedtime. Patient taking  differently: Take 1 tablet by mouth daily with breakfast.  05/06/17  Yes Gwynn Burly, DO  oseltamivir (TAMIFLU) 30 MG capsule Take 1 capsule (30 mg total) by mouth daily. On dialysis days, take after dialysis 12/04/17  Yes Jacalyn Lefevre, MD  sulfamethoxazole-trimethoprim (BACTRIM,SEPTRA) 400-80 MG tablet Take 1 tablet by mouth daily. Patient taking differently: Take 1 tablet by mouth daily with breakfast.  10/07/17  Yes Blanchard Kelch, NP    Family History Family History  Problem Relation Age of Onset  . Diabetes Mother     Social History Social History   Tobacco Use  . Smoking status: Current Every Day Smoker    Packs/day: 0.25    Types: Cigarettes    Start date: 09/24/2002  . Smokeless tobacco: Never Used  Substance Use Topics  . Alcohol use: No  . Drug use: No     Allergies   Tomato   Review of Systems Review of Systems  Constitutional: Positive for fatigue.  Respiratory: Positive for cough.   Cardiovascular: Positive for chest pain.  Gastrointestinal: Positive for abdominal pain and nausea.  Neurological: Positive for weakness.     Physical Exam Updated Vital Signs BP (!) 154/126   Pulse 93   Temp 98.7 F (37.1 C) (Oral)   Resp (!) 22   Ht 6\' 1"  (1.854 m)   Wt 117.9 kg (260 lb)   SpO2 (!) 88%   BMI 34.30 kg/m   Physical Exam  Constitutional: He is oriented to person, place, and time. He appears well-developed and well-nourished. No distress.  HENT:  Head: Normocephalic and atraumatic.  Eyes: Conjunctivae are normal.  Neck: Neck supple.  Cardiovascular: Normal rate, regular rhythm and normal heart sounds. Exam reveals no friction rub.  No murmur heard. Pulmonary/Chest: Effort normal and breath sounds normal. No respiratory distress. He has no wheezes. He has no rales.  Abdominal: Bowel sounds are normal. He exhibits no distension. There is tenderness in the right upper quadrant and epigastric area. There is positive Murphy's sign. There is no  rigidity and no guarding.  Musculoskeletal: He exhibits no edema.  Neurological: He is alert and oriented to person, place, and time. He exhibits normal muscle tone.  Skin: Skin is warm. Capillary refill takes less than 2 seconds.  Psychiatric: He has a normal mood and affect.  Nursing note and vitals reviewed.    ED Treatments / Results  Labs (all labs ordered are listed, but only abnormal results are displayed) Labs Reviewed  TROPONIN I - Abnormal; Notable for the following components:      Result Value   Troponin I 0.35 (*)    All other components within normal limits  COMPREHENSIVE METABOLIC PANEL - Abnormal; Notable for the following components:   CO2 18 (*)    BUN 84 (*)    Creatinine, Ser 15.89 (*)    Calcium 8.5 (*)    Albumin 2.9 (*)    AST 185 (*)  ALT 191 (*)    GFR calc non Af Amer 3 (*)    GFR calc Af Amer 4 (*)    All other components within normal limits  CBC WITH DIFFERENTIAL/PLATELET - Abnormal; Notable for the following components:   RBC 3.11 (*)    Hemoglobin 9.7 (*)    HCT 28.9 (*)    RDW 16.4 (*)    All other components within normal limits  LIPASE, BLOOD - Abnormal; Notable for the following components:   Lipase 72 (*)    All other components within normal limits  PROTIME-INR - Abnormal; Notable for the following components:   Prothrombin Time 16.2 (*)    All other components within normal limits  I-STAT TROPONIN, ED - Abnormal; Notable for the following components:   Troponin i, poc 0.36 (*)    All other components within normal limits  I-STAT CHEM 8, ED - Abnormal; Notable for the following components:   Potassium 5.2 (*)    BUN 84 (*)    Creatinine, Ser 17.00 (*)    Calcium, Ion 1.03 (*)    Hemoglobin 9.5 (*)    HCT 28.0 (*)    All other components within normal limits  I-STAT CG4 LACTIC ACID, ED  I-STAT CG4 LACTIC ACID, ED    EKG EKG Interpretation  Date/Time:  Monday January 13 2018 13:53:33 EDT Ventricular Rate:  96 PR  Interval:    QRS Duration: 86 QT Interval:  387 QTC Calculation: 490 R Axis:   55 Text Interpretation:  Sinus rhythm Probable left atrial enlargement Nonspecific T abnormalities, lateral leads Borderline prolonged QT interval Since last EKG, TWI now noted in V6 Otherwise no significant change Confirmed by Shaune Pollack [phone]) on 01/13/2018 3:11:04 PM   Radiology Dg Chest 2 View  Result Date: 01/13/2018 CLINICAL DATA:  Left-sided chest pain. EXAM: CHEST - 2 VIEW COMPARISON:  01/11/2018 FINDINGS: Stable moderate cardiac enlargement. Mild amount of fissural thickening again noted in the lateral projection, likely reflecting a small amount of pleural fluid in the fissures. There may be mild pulmonary interstitial edema bilaterally. There is no evidence of airspace edema, consolidation, pneumothorax or nodule. IMPRESSION: Cardiac enlargement and possible mild pulmonary interstitial edema. Electronically Signed   By: Irish Lack M.D.   On: 01/13/2018 16:20   US Abdomen Limited Ruq  Result Date: 01/13/2018 CLINICAL DATA:  37 year old male with right upper quadrant pain for the past week. Subsequent encounter. EXAM: ULTRASOUND ABDOMEN LIMITED RIGHT UPPER QUADRANT COMPARISON:  01/11/2018 ultrasound. FINDINGS: Gallbladder: 2.4 cm gallstone lodged in the gallbladder neck. Marked gallbladder wall thickening measuring up to 1.4 cm. Patient was tender over the gallbladder during scanning. Common bile duct: Diameter: 3.3 mm. Liver: Heterogeneous without focal mass. Portal vein is patent on color Doppler imaging with normal direction of blood flow towards the liver. Small right pleural effusion. Right renal parenchyma increased echogenicity consistent with patient's history of renal failure. IMPRESSION: 2.4 cm gallstone lodged in the gallbladder neck. Marked gallbladder wall thickening measuring up to 1.4 cm (as previously noted). Patient was tender over the gallbladder during scanning. Findings consistent with  acute cholecystitis. Small right-sided pleural effusion. Right renal parenchyma increased echogenicity consistent with patient's history of renal failure. These results were called by telephone at the time of interpretation on 01/13/2018 at 5:43 pm to Dr. Shaune Pollack , who verbally acknowledged these results. Electronically Signed   By: Lacy Duverney M.D.   On: 01/13/2018 17:46    Procedures .Critical Care Performed by: Erma Heritage,  Sheria Lang, MD Authorized by: Shaune Pollack, MD   Critical care provider statement:    Critical care time (minutes):  45   Critical care time was exclusive of:  Separately billable procedures and treating other patients and teaching time   Critical care was necessary to treat or prevent imminent or life-threatening deterioration of the following conditions:  Cardiac failure, circulatory failure, sepsis and metabolic crisis   Critical care was time spent personally by me on the following activities:  Development of treatment plan with patient or surrogate, discussions with consultants, evaluation of patient's response to treatment, examination of patient, obtaining history from patient or surrogate, ordering and performing treatments and interventions, ordering and review of laboratory studies, ordering and review of radiographic studies, pulse oximetry, re-evaluation of patient's condition and review of old charts   I assumed direction of critical care for this patient from another provider in my specialty: no     (including critical care time)  Medications Ordered in ED Medications  HYDROmorphone (DILAUDID) injection 1 mg (has no administration in time range)  labetalol (NORMODYNE,TRANDATE) injection 10 mg (has no administration in time range)  piperacillin-tazobactam (ZOSYN) IVPB 3.375 g (has no administration in time range)     Initial Impression / Assessment and Plan / ED Course  I have reviewed the triage vital signs and the nursing notes.  Pertinent labs &  imaging results that were available during my care of the patient were reviewed by me and considered in my medical decision making (see chart for details).  Clinical Course as of Jan 14 1807  Mon Jan 13, 2018  5715 37 year old male with history of HIV AIDS, end-stage renal disease, here with nausea, epigastric and chest pain.  Records from recent visit reviewed patient noted to have acute cholecystitis.  Concern for ongoing cholecystitis, also with likely hypervolemia and possible significant symptomatic hypertension in the setting of missing dialysis for a week.  Will check labs, give analgesia, and reassess.  EKG nonischemic.   [CI]  1626 Positive troponin, likely demand.  Repeat EKG without any ST changes.  LFTs, remainder of lab work pending.   [CI]  [id number] Placed a call to on-call nephrology as patient will likely need dialysis given his blood pressure, elevated troponin, and chest pain.   [CI]  1739 LFTs remain elevated, but electrolytes are otherwise without signs for emergent dialysis.  Discussed with Dr. Lowell Guitar of nephrology who will see the patient.  Awaiting formal ultrasound results.  Zosyn given empirically for cholecystitis.   [CI]  1745 Ultrasound shows acute cholecystitis.  Will page surgery.  Patient n.p.o.  Zosyn given.  Will likely need medicine admission for symptom and comorbidity management.   [CI]  1807 D/w Dr. Cliffton Asters. Will admit to medicine.   [CI]    Clinical Course User Index [CI] Shaune Pollack, MD     Final Clinical Impressions(s) / ED Diagnoses   Final diagnoses:  RUQ pain  Cholecystitis  ESRD (end stage renal disease) (HCC)  Elevated troponin    ED Discharge Orders    None       Shaune Pollack, MD 01/13/18 [phone]

## 2018-01-14 DIAGNOSIS — Z87891 Personal history of nicotine dependence: Secondary | ICD-10-CM

## 2018-01-14 DIAGNOSIS — R0789 Other chest pain: Secondary | ICD-10-CM

## 2018-01-14 DIAGNOSIS — I248 Other forms of acute ischemic heart disease: Secondary | ICD-10-CM | POA: Diagnosis present

## 2018-01-14 LAB — CBC
HCT: 29.2 % — ABNORMAL LOW (ref 39.0–52.0)
Hemoglobin: 9.9 g/dL — ABNORMAL LOW (ref 13.0–17.0)
MCH: 31.6 pg (ref 26.0–34.0)
MCHC: 33.9 g/dL (ref 30.0–36.0)
MCV: 93.3 fL (ref 78.0–100.0)
Platelets: 163 10*3/uL (ref 150–400)
RBC: 3.13 MIL/uL — ABNORMAL LOW (ref 4.22–5.81)
RDW: 16.3 % — ABNORMAL HIGH (ref 11.5–15.5)
WBC: 7.3 10*3/uL (ref 4.0–10.5)

## 2018-01-14 LAB — RENAL FUNCTION PANEL
Albumin: 2.8 g/dL — ABNORMAL LOW (ref 3.5–5.0)
Anion gap: 16 — ABNORMAL HIGH (ref 5–15)
BUN: 91 mg/dL — ABNORMAL HIGH (ref 6–20)
CO2: 17 mmol/L — ABNORMAL LOW (ref 22–32)
Calcium: 8.1 mg/dL — ABNORMAL LOW (ref 8.9–10.3)
Chloride: 103 mmol/L (ref 101–111)
Creatinine, Ser: 16.32 mg/dL — ABNORMAL HIGH (ref 0.61–1.24)
GFR calc Af Amer: 4 mL/min — ABNORMAL LOW (ref >60)
GFR calc non Af Amer: 3 mL/min — ABNORMAL LOW (ref >60)
Glucose, Bld: 76 mg/dL (ref 65–99)
Phosphorus: 6.9 mg/dL — ABNORMAL HIGH (ref 2.5–4.6)
Potassium: 4.4 mmol/L (ref 3.5–5.1)
Sodium: 136 mmol/L (ref 135–145)

## 2018-01-14 LAB — MRSA PCR SCREENING: MRSA by PCR: NEGATIVE

## 2018-01-14 LAB — TROPONIN I
Troponin I: 0.28 ng/mL (ref <0.03)
Troponin I: 0.29 ng/mL (ref <0.03)

## 2018-01-14 MED ORDER — DARBEPOETIN ALFA 100 MCG/0.5ML IJ SOSY
100.0000 ug | PREFILLED_SYRINGE | INTRAMUSCULAR | Status: DC
  Administered 2018-01-16: 100 ug via INTRAVENOUS
  Filled 2018-01-14: qty 0.5

## 2018-01-14 MED ORDER — SEVELAMER CARBONATE 800 MG PO TABS
1600.0000 mg | ORAL_TABLET | Freq: Three times a day (TID) | ORAL | Status: DC
  Administered 2018-01-15 – 2018-01-17 (×6): 1600 mg via ORAL
  Filled 2018-01-14 (×6): qty 2

## 2018-01-14 MED ORDER — CALCITRIOL 0.25 MCG PO CAPS
0.5000 ug | ORAL_CAPSULE | ORAL | Status: DC
  Filled 2018-01-14: qty 2

## 2018-01-14 MED ORDER — SULFAMETHOXAZOLE-TRIMETHOPRIM 400-80 MG PO TABS
1.0000 | ORAL_TABLET | Freq: Every day | ORAL | Status: DC
  Administered 2018-01-14 – 2018-01-16 (×4): 1 via ORAL
  Filled 2018-01-14 (×5): qty 1

## 2018-01-14 MED ORDER — PIPERACILLIN-TAZOBACTAM 3.375 G IVPB
3.3750 g | Freq: Two times a day (BID) | INTRAVENOUS | Status: DC
  Administered 2018-01-14 – 2018-01-17 (×7): 3.375 g via INTRAVENOUS
  Filled 2018-01-14 (×8): qty 50

## 2018-01-14 MED ORDER — METOPROLOL SUCCINATE ER 25 MG PO TB24
25.0000 mg | ORAL_TABLET | Freq: Every day | ORAL | Status: DC
  Administered 2018-01-14 – 2018-01-17 (×4): 25 mg via ORAL
  Filled 2018-01-14 (×4): qty 1

## 2018-01-14 NOTE — Progress Notes (Signed)
Came by to see patient 10:20 am and 11:40 am but patient was sleeping or did not respond.  Left the AD material there and hope patient will be awake at another time in order to do AD.Phebe CollaDonna S Ameila Weldon, Chaplain   01/14/18 1200  Clinical Encounter Type  Visited With Patient not available  Visit Type Other (Comment) (attempted to see patient 2 times)  Referral From Nurse  Consult/Referral To Chaplain

## 2018-01-14 NOTE — Progress Notes (Signed)
   01/14/18 0115 01/14/18 0116  Vitals  Temp  --  (!) 101.6 F (38.7 C)  Temp Source  --  Oral  BP (!) 155/111  --   MAP (mmHg) 123  --   BP Location Left Arm  --   BP Method Automatic  --   Patient Position (if appropriate) Lying  --   Pulse Rate (!) 105  --   Resp 18  --   Oxygen Therapy  SpO2 100 %  --   O2 Device Room Air  --    Sepsis alert initiated via epic. [facility]. Orders received. Will continue to monitor per Sepsis alert protocol.

## 2018-01-14 NOTE — Progress Notes (Signed)
Patient's BP 177/127. MD notified. Advised to monitor and let MD know if systolic goes above 180.

## 2018-01-14 NOTE — Progress Notes (Signed)
Pharmacy Antibiotic Note  James Schwartz is a 37 y.o. male admitted on 01/13/2018 with acute cholecystitis.  Pharmacy has been consulted for Zosyn dosing.  Plan: Zosyn 3.375g IV q12h (4-hour infusion).  Height: 6\' 1"  (185.4 cm) Weight: 251 lb 8.7 oz (114.1 kg) IBW/kg (Calculated) : 79.9  Temp (24hrs), Avg:99.5 F (37.5 C), Min:98.3 F (36.8 C), Max:101.6 F (38.7 C)  Recent Labs  Lab 01/11/18 1451 01/13/18 1552 01/13/18 1600 01/13/18 1601 01/13/18 1947  WBC 4.8 5.5  --   --   --   CREATININE 12.84* 15.89* 17.00*  --   --   LATICACIDVEN  --   --   --  0.90 1.05    Estimated Creatinine Clearance: 8 mL/min (A) (by C-G formula based on SCr of 17 mg/dL (H)).    Allergies  Allergen Reactions  . Tomato Other (See Comments)    Mouth sores    Thank you for allowing pharmacy to be a part of this patient's care.  Vernard GamblesVeronda Conall Vangorder, PharmD, BCPS  01/14/2018 1:45 AM

## 2018-01-14 NOTE — Procedures (Signed)
Tolerating HD but venous pressure has increased and BFR decreased to 250. We will order a fistulogram.  In addition I will ask surgery about a more definitive GB surgical procedure. Lauris PoagAlvin C Acey Woodfield, MD

## 2018-01-14 NOTE — Progress Notes (Signed)
Initial Nutrition Assessment  DOCUMENTATION CODES:   Obesity unspecified  INTERVENTION:  Monitor for needs and diet advancement  Provide Nepro Shake po BID, each supplement provides 425 kcal and 19 grams protein with advancement   NUTRITION DIAGNOSIS:   Inadequate oral intake related to nausea, poor appetite as evidenced by per patient/family report.  GOAL:   Patient will meet greater than or equal to 90% of their needs  MONITOR:   Diet advancement, I & O's  REASON FOR ASSESSMENT:   Malnutrition Screening Tool    ASSESSMENT:   [name] is a 37 yo male with PMH HIV/AIDS, ESRD on HD, TThS, HTN, presents with abdominal pain and chest pain. He has had progressively worsening abdominal pain for one week, nausea, and epigastric pain. Found with acute cholecystitis upon ED visit 2 days ago, abdominal pain x6 days, 8/10 in severity. Now has 2.4cm gallstone logded in gallbladder neck in addition to acute cholecystitis. Also has elevated transaminases. Has not been to HD in one week because he was not feeling well. Also has some mild dyspnea on exertion. Inconsistent with HAART use.  Spoke with [name] at bedside. He reports poor appetite x1 week, still eating 3 meals a day but eats 1/2 of the portions he normally eats related to nausea and abdominal pain.   UBW of 260 pounds recently, indicating a 9 pound/3.4% severe weight loss for timeframe. States that he lost a lot of weight when he started dialysis, appears this was around 04/2017. Per chart his weight has been relatively stable since then, he was between 255-265 pounds at the time.  Nausea has resolved but now he just complains of hunger, has been NPO for two days now for possible procedure. Surgery is holding on lap chole at this time due to status of HIV/AIDS, and issues with recent dialysis.  Monitor NPO/Clear liquid status.  Labs reviewed:  BUN/Creatinine 91/16.32 LFTs elevated  Medications reviewed and include:   Rena-vit, Renevla, Tivicay, edurant, symtuz  NUTRITION - FOCUSED PHYSICAL EXAM:    Most Recent Value  Orbital Region  No depletion  Upper Arm Region  No depletion  Thoracic and Lumbar Region  No depletion  Buccal Region  No depletion  Temple Region  No depletion  Clavicle Bone Region  No depletion  Clavicle and Acromion Bone Region  No depletion  Scapular Bone Region  No depletion  Dorsal Hand  No depletion  Patellar Region  No depletion  Anterior Thigh Region  No depletion  Posterior Calf Region  No depletion       Diet Order:  Diet NPO time specified Diet clear liquid Room service appropriate? Yes; Fluid consistency: Thin  EDUCATION NEEDS:   Not appropriate for education at this time  Skin:  Skin Assessment: Reviewed RN Assessment  Last BM:  01/13/2018  Height:   Ht Readings from Last 1 Encounters:  01/14/18 6\' 1"  (1.854 m)    Weight:   Wt Readings from Last 1 Encounters:  01/14/18 251 lb 8.7 oz (114.1 kg)    Ideal Body Weight:  83.63 kg  BMI:  Body mass index is 33.19 kg/m.  Estimated Nutritional Needs:   Kcal:  2500-2758 calories (MSJ x1.2-1.3)  Protein:  148-171 grams  Fluid:  UOP +1L  Dionne AnoWilliam M. Jessup Ogas, MS, RD LDN Inpatient Clinical Dietitian Pager [phone]

## 2018-01-14 NOTE — Consult Note (Signed)
[facility] Renal Consultation Note    Indication for Consultation:  Management of ESRD/hemodialysis; anemia, hypertension/volume and secondary hyperparathyroidism  HPI: James Schwartz is a 37 y.o. male with ESRD on HD (TTS [facility]), HTN, HIV. HD started 04/2017. He initially presented to ED Saturday having been unable to received dialysis since 4/16 due to vascular access issues. There were no acute indications for dialysis at that time, but he was found to have acute cholecystitis on abd ultrasound but he left the ED AMA.   He returned to ED Monday afternoon with chest and abdominal pain. Temp 101.82F on admission  Labs K+ 4.4, Na 136 Ca 8.1, Cr/BUN 16.32/91,WBC 7.3 Hgb 9.9   Lipase 72, elevated AST/ALT. Repeat Abd [place] consistent with acute cholecystitis and antibiotics started and surgery consulted.  CXR with mild pulmonary edema.   Seen in room and not providing much history. He says he is tired and doesn't want to talk. Endorses abdominal pain, nausea.   His last dialysis was Tuesday 4/16. He has been dialyzing via RUE AVF since November without issue and had his dialysis catheter removed Wed of last week. At that appointment was noted to have hematoma over access and was told to apply warm compress. Dialysis staff was unable to cannulate access Thursday and Saturday because of hematoma.    Past Medical History:  Diagnosis Date  . Asthma   . ESRD (end stage renal disease) (HCC)   . HIV (human immunodeficiency virus infection) (HCC)   . Hypertension 04/28/2017   Past Surgical History:  Procedure Laterality Date  . AV FISTULA PLACEMENT Right 05/06/2017   Procedure: ARTERIOVENOUS (AV) FISTULA CREATION RIGHT ARM;  Surgeon: Fransisco Hertz, MD;  Location: [facility] OR;  Service: Vascular;  Laterality: Right;  . EXCHANGE OF A DIALYSIS CATHETER Right 05/06/2017   Procedure: EXCHANGE OF A DIALYSIS CATHETER RIGHT;  Surgeon: Fransisco Hertz, MD;  Location: [facility] OR;  Service:  Vascular;  Laterality: Right;  . IR FLUORO GUIDE CV LINE RIGHT  04/29/2017  . IR US GUIDE VASC ACCESS RIGHT  04/29/2017   Family History  Problem Relation Age of Onset  . Diabetes Mother    Social History:  reports that he has been smoking cigarettes.  He started smoking about 15 years ago. He has been smoking about 0.25 packs per day. He has never used smokeless tobacco. He reports that he does not drink alcohol or use drugs. Allergies  Allergen Reactions  . Tomato Other (See Comments)    Mouth sores   Prior to Admission medications   Medication Sig Start Date End Date Taking? Authorizing Provider  ciprofloxacin (CIPRO) 500 MG tablet Take 1 tablet (500 mg total) by mouth daily with breakfast. 01/11/18  Yes Mackuen, Courteney Lyn, MD  Darbepoetin Alfa (ARANESP) 100 MCG/0.5ML SOSY injection Inject 0.5 mLs (100 mcg total) into the vein every Wednesday with hemodialysis. 05/08/17  Yes Gwynn Burly, DO  Darunavir-Cobicisctat-Emtricitabine-Tenofovir Alafenamide [facility]) 800-150-200-10 MG TABS Take 1 tablet by mouth daily with breakfast. 10/07/17  Yes Dixon, Gomez Cleverly, NP  Dolutegravir-Rilpivirine (JULUCA) 50-25 MG TABS Take 1 tablet by mouth daily with supper. Patient taking differently: Take 1 tablet by mouth daily with breakfast.  10/07/17  Yes Dixon, Gomez Cleverly, NP  gabapentin (NEURONTIN) 100 MG capsule TAKE 1 CAPSULE (100 MG TOTAL) BY MOUTH TWO (TWO) TIMES DAILY. 10/14/17  Yes Blanchard Kelch, NP  HYDROcodone-acetaminophen (NORCO/VICODIN) 5-325 MG tablet Take 1 tablet by mouth every 4 (four) hours as needed.  12/03/17  Yes Jacalyn LefevreHaviland, Julie, MD  metoprolol succinate (TOPROL XL) 25 MG 24 hr tablet Take 1 tablet (25 mg total) by mouth daily. TAKE WITH 50 MG tablet. 01/02/18  Yes Blanchard Kelchixon, Stephanie N, NP  metoprolol succinate (TOPROL XL) 50 MG 24 hr tablet Take 1 tablet (50 mg total) by mouth daily. Take with or immediately following a meal. 05/17/17 05/17/18 Yes Chundi, Vahini, MD  metroNIDAZOLE  (FLAGYL) 500 MG tablet Take 1 tablet (500 mg total) by mouth 2 (two) times daily for 4 days. 01/11/18 01/15/18 Yes Mackuen, Courteney Lyn, MD  multivitamin (RENA-VIT) TABS tablet Take 1 tablet by mouth at bedtime. Patient taking differently: Take 1 tablet by mouth daily with breakfast.  05/06/17  Yes Gwynn BurlyWallace, Andrew, DO  oseltamivir (TAMIFLU) 30 MG capsule Take 1 capsule (30 mg total) by mouth daily. On dialysis days, take after dialysis 12/04/17  Yes Jacalyn LefevreHaviland, Julie, MD  sulfamethoxazole-trimethoprim (BACTRIM,SEPTRA) 400-80 MG tablet Take 1 tablet by mouth daily. Patient taking differently: Take 1 tablet by mouth daily with breakfast.  10/07/17  Yes Blanchard Kelchixon, Stephanie N, NP   Current Facility-Administered Medications  Medication Dose Route Frequency Provider Last Rate Last Dose  . acetaminophen (TYLENOL) tablet 650 mg  650 mg Oral Q6H PRN Nyra MarketSvalina, Gorica, MD   650 mg at 01/14/18 0142   Or  . acetaminophen (TYLENOL) suppository 650 mg  650 mg Rectal Q6H PRN Nyra MarketSvalina, Gorica, MD      . Darunavir-Cobicisctat-Emtricitabine-Tenofovir Alafenamide (SYMTUZA) 800-150-200-10 MG TABS 1 tablet  1 tablet Oral Q breakfast Nyra MarketSvalina, Gorica, MD      . dolutegravir (TIVICAY) tablet 50 mg  50 mg Oral Q breakfast Hammons, Gerhard MunchKimberly B, RPH       And  . rilpivirine (EDURANT) tablet 25 mg  25 mg Oral Q breakfast Hammons, Kimberly B, RPH      . multivitamin (RENA-VIT) tablet 1 tablet  1 tablet Oral QHS Nyra MarketSvalina, Gorica, MD   1 tablet at 01/13/18 2234  . piperacillin-tazobactam (ZOSYN) IVPB 3.375 g  3.375 g Intravenous Q12H Juliette MangleBryk, Veronda P, RPH   Stopped at 01/14/18 1014  . promethazine (PHENERGAN) tablet 12.5 mg  12.5 mg Oral Q6H PRN Nyra MarketSvalina, Gorica, MD   12.5 mg at 01/13/18 2336   Or  . promethazine (PHENERGAN) injection 12.5 mg  12.5 mg Intravenous Q6H PRN Nyra MarketSvalina, Gorica, MD      . sulfamethoxazole-trimethoprim (BACTRIM,SEPTRA) 400-80 MG per tablet 1 tablet  1 tablet Oral QHS Anne Shutteraines, Alexander N, MD        ROS: As per HPI  otherwise negative.  Physical Exam: Vitals:   01/14/18 0115 01/14/18 0116 01/14/18 0411 01/14/18 0643  BP: (!) 155/111  (!) 158/111 (!) 160/113  Pulse: (!) 105  97 94  Resp: 18  16 18   Temp:  (!) 101.6 F (38.7 C) 99.3 F (37.4 C) 98.9 F (37.2 C)  TempSrc:  Oral Oral Oral  SpO2: 100%  100% 97%  Weight:      Height:         General: WDWN male NAD  Head: NCAT sclera not icteric MMM Neck: Supple. No JVD No masses Lungs: CTA bilaterally without wheezes, rales, or rhonchi. Breathing is unlabored. Heart: RRR with S1 S2 Abdomen: soft BS+ diffuse tenderness to palpation  Lower extremities:without edema or ischemic changes, no open wounds  Neuro: A & O  X 3. Moves all extremities spontaneously. Psych:  Responds to questions appropriately with a normal affect. Dialysis Access: RUE AVF +strong bruit/thrill throughout - should be  able to use   Labs: Basic Metabolic Panel: Recent Labs  Lab 01/11/18 1451 01/13/18 1552 01/13/18 1600 01/14/18 0408  NA 133* 137 137 136  K 4.4 4.9 5.2* 4.4  CL 102 106 104 103  CO2 14* 18*  --  17*  GLUCOSE 126* 91 94 76  BUN 63* 84* 84* 91*  CREATININE 12.84* 15.89* 17.00* 16.32*  CALCIUM 8.5* 8.5*  --  8.1*  PHOS  --   --   --  6.9*   Liver Function Tests: Recent Labs  Lab 01/11/18 1451 01/13/18 1552 01/14/18 0408  AST 196* 185*  --   ALT 126* 191*  --   ALKPHOS 85 96  --   BILITOT 1.1 0.7  --   PROT 8.2* 7.5  --   ALBUMIN 3.0* 2.9* 2.8*   Recent Labs  Lab 01/13/18 1552  LIPASE 72*   No results for input(s): AMMONIA in the last 168 hours. CBC: Recent Labs  Lab 01/11/18 1451 01/13/18 1552 01/13/18 1600 01/14/18 0408  WBC 4.8 5.5  --  7.3  NEUTROABS  --  2.8  --   --   HGB 9.9* 9.7* 9.5* 9.9*  HCT 29.7* 28.9* 28.0* 29.2*  MCV 92.5 92.9  --  93.3  PLT 139* 183  --  163   Cardiac Enzymes: Recent Labs  Lab 01/13/18 1552 01/13/18 2355 01/14/18 0408  TROPONINI 0.35* 0.28* 0.29*   CBG: No results for input(s): GLUCAP in  the last 168 hours. Iron Studies: No results for input(s): IRON, TIBC, TRANSFERRIN, FERRITIN in the last 72 hours. Studies/Results: Dg Chest 2 View  Result Date: 01/13/2018 CLINICAL DATA:  Left-sided chest pain. EXAM: CHEST - 2 VIEW COMPARISON:  01/11/2018 FINDINGS: Stable moderate cardiac enlargement. Mild amount of fissural thickening again noted in the lateral projection, likely reflecting a small amount of pleural fluid in the fissures. There may be mild pulmonary interstitial edema bilaterally. There is no evidence of airspace edema, consolidation, pneumothorax or nodule. IMPRESSION: Cardiac enlargement and possible mild pulmonary interstitial edema. Electronically Signed   By: Irish Lack M.D.   On: 01/13/2018 16:20   US Abdomen Limited Ruq  Result Date: 01/13/2018 CLINICAL DATA:  37 year old male with right upper quadrant pain for the past week. Subsequent encounter. EXAM: ULTRASOUND ABDOMEN LIMITED RIGHT UPPER QUADRANT COMPARISON:  01/11/2018 ultrasound. FINDINGS: Gallbladder: 2.4 cm gallstone lodged in the gallbladder neck. Marked gallbladder wall thickening measuring up to 1.4 cm. Patient was tender over the gallbladder during scanning. Common bile duct: Diameter: 3.3 mm. Liver: Heterogeneous without focal mass. Portal vein is patent on color Doppler imaging with normal direction of blood flow towards the liver. Small right pleural effusion. Right renal parenchyma increased echogenicity consistent with patient's history of renal failure. IMPRESSION: 2.4 cm gallstone lodged in the gallbladder neck. Marked gallbladder wall thickening measuring up to 1.4 cm (as previously noted). Patient was tender over the gallbladder during scanning. Findings consistent with acute cholecystitis. Small right-sided pleural effusion. Right renal parenchyma increased echogenicity consistent with patient's history of renal failure. These results were called by telephone at the time of interpretation on 01/13/2018 at  5:43 pm to Dr. Shaune Pollack , who verbally acknowledged these results. Electronically Signed   By: Lacy Duverney M.D.   On: 01/13/2018 17:46    Dialysis Orders:  [facility] 4.5h 200NRe 550/800 EDW 115kg 2K/2.5Ca  R AVF No heparin  Mircera IV q 2 weeks ( dosed 4/4) Calcitriol 0.32mcg PO TIW   Assessment/Plan: 1.  Acute cholecystitis - per primary/surgery. IV Zosyn per pharmacy.  2.  ESRD -  TTS. Has missed a week of dialysis with access issues. Developed hematoma over access that appears to have improved today. Will plan to attempt cannulation with HD today.  3.  Hypertension/volume  - BP up - Continue meds/Should improve with dialysis/Max UF with HD  4.  Anemia  - Hgb 9.9. Resume ESA. Aranesp 100 next HD  5.  Metabolic bone disease -  Continue Calcitriol/Renvela binder when diet resumes  6.  HIV - ART per primary/ID   Tomasa Blase PA-C [facility] Pager [phone] 01/14/2018, 12:21 PM

## 2018-01-14 NOTE — Progress Notes (Signed)
Subjective: Mr James Schwartz was seen in his bed this morning. He states he is feeling a little better today. He states he tried to go to his last 2 dialysis sessions, but they were unable to cannulate his AV fistula (apparentyl due to hematoma at the site). He states he has been trying to take his HIV medication regularly but does miss some doses. He denies and pain or nausea at this time. He states he has some apprehension about surgery, but would prefer to have his gallbladder taken out to have it over with if that remains an option. He has no other questions or complaints this morning.  Objective:  Vital signs in last 24 hours: Vitals:   01/14/18 0116 01/14/18 0411 01/14/18 0643 01/14/18 1333  BP:  (!) 158/111 (!) 160/113 (!) 177/127  Pulse:  97 94 92  Resp:  16 18 20   Temp: (!) 101.6 F (38.7 C) 99.3 F (37.4 C) 98.9 F (37.2 C) 99.3 F (37.4 C)  TempSrc: Oral Oral Oral Oral  SpO2:  100% 97% 100%  Weight:      Height:       Physical Exam  Constitutional: He is oriented to person, place, and time. He appears well-developed and well-nourished.  Appears mildly uncomfortable  HENT:  Head: Normocephalic and atraumatic.  Cardiovascular: Normal rate, regular rhythm, normal heart sounds and intact distal pulses.  Pulmonary/Chest: Effort normal and breath sounds normal. No respiratory distress.  Abdominal: Soft. Bowel sounds are normal.  Tender to palpation of RUQ and epigastrum  Musculoskeletal: He exhibits no edema or deformity.  Neurological: He is alert and oriented to person, place, and time.  Skin: Skin is warm and dry.   Assessment/Plan:  Acute Cholecystitis: Presented w/ 6d of abd pain and ED visit 2 days prior for worsening abdominal pain. RUQ [place] consistent with acute cholecystitis on 4/20, but patient declined admission. Repeat RUQ [place] 4/22 re-demonstrated gall bladder wall thickening with new 2.4 cm gallstone lodged in the gallbladder neck, consistent with acute cholecystitis.  LFTs: AST 185, ALTPatient also has elevated transaminases. He is currently afebrile and hemodynamically stable. He has been seen and evaluated by surgery who started the patient on zosyn.  - General surgery consulted, appreciate their recommendations; considering percutaneous cholecystostomy tube - No surgery today, will start soft diet, NPO at midnight  - AM CBC and Renal Function Panel - Continue Zosyn per pharmacy - SCDs  Chest Pain: Chest pain with atypical and typical features present on admission. Pain was relieved by nitro. Risk factors include smoking history, HIV, and obesity. With patients age and history, suspicion for ACS is lower, but troponin was elevated and EKG showed T-wave inversion in V5 and V5. > Remain Chest Pain free > Troponin: 035>>0.28>>0.29 - Will continue to monitor for chest pain her, will likely need stress test outpatient - Cardiac Monitoring  HIV/AIDS: Diagnosed in 2002. History of medication non-adherence. Currently on Symtuza (Darunavir-Cobicisctat-Emtricitabine-Tenofovir Alafenamide), Juluca (Dolutegravir-Rilpivirine), and Bactrim (for ppx). ID aware of patient's admission. > Most recent CD4 count 155, VL 6870. (Nov 2018) - Will continue home medications  - CD4 count  ESRD on HD TThS: No dialysis since session on 4/16. On HD since 04/2017. K 4.9 on admission. CXR with mild pulmonary edema, appears largely euvolemic on examination.  > Issues with cannulating fistula at last 2 dialysis sessions - Nephrology consulted for HD, appreciate recommendations - To undergo dialysis today  HTN: BP elevated 140-170 systolics, diastolic ranging from 100-130.  Home regimen includes  metoprolol succinate 75 mg daily. Unclear if patient has been taking his BP medications.  - BP will likely improve with HD - Metoprolol 75mg  Daily - Will continue to monitor  FEN: Soft Diet, NPO MN VTE ppx: SCDs Code Status: FULL  Dispo: Anticipated discharge in approximately 2-5 day(s).    Beola CordMelvin, Alexander, MD 01/14/2018, 1:55 PM Pager: [phone]

## 2018-01-14 NOTE — Progress Notes (Signed)
[facility]uma Progress Note      Assessment/Plan  HIV/AIDS - CD4 pending HTN ESRD on HD  Cholecystitis - US showed 2.4 cm gallstone lodged in the gallbladder neck. Marked gallbladder wall thickening measuring up to 1.4 cm  - AST 185 and ALT 191, Tbili 07 (04/22) - repeat LFT's tomorrow - concerns for increased post surgical infections since pt has not been compliant with HIV meds and last CD4 in Nov 2018 was 155, CD4 pending - recommend perc chole tube at this time, order placed  FEN: clears VTE: SCD's, per medicine ID: Zosyn 04/22>>   Bactrim 04/22>>   Antivirals 04/22>> Foley: none Follow up: TBD    LOS: 1 day    Subjective: CC: hungry  Pt states minimal abdominal pain at this time and is unsure why he cannot eat. I explained the pathophysiology of cholecystitis and why any food with fat would worsen his symptoms. He has had 2 episodes of emesis since admission. No blood in emesis that he is aware of. Pt undergoing HD.   Objective: Vital signs in last 24 hours: Temp:  [98.3 F (36.8 C)-101.6 F (38.7 C)] 99.3 F (37.4 C) (04/23 1333) Pulse Rate:  [36-116] 92 (04/23 1333) Resp:  [16-40] 20 (04/23 1333) BP: (128-177)/(93-127) 177/127 (04/23 1333) SpO2:  [88 %-100 %] 100 % (04/23 1333) Weight:  [114.1 kg (251 lb 8.7 oz)] 114.1 kg (251 lb 8.7 oz) (04/23 0100) Last BM Date: 01/13/18  Intake/Output from previous day: 04/22 0701 - 04/23 0700 In: 50 [IV Piggyback:50] Out: 400 [Emesis/NG output:400] Intake/Output this shift: No intake/output data recorded.  PE: Gen:  Alert, NAD, pleasant, cooperative Pulm:  Rate and effort normal Abd: Soft, ND, TTP in epigastric region Skin: warm and dry   Anti-infectives: Anti-infectives (From admission, onward)   Start     Dose/Rate Route Frequency Ordered Stop   01/14/18 1700  Dolutegravir-Rilpivirine 50-25 MG TABS 1 tablet  Status:  Discontinued     1 tablet Oral Daily with supper 01/13/18 2050 01/13/18  2214   01/14/18 1130  sulfamethoxazole-trimethoprim (BACTRIM,SEPTRA) 400-80 MG per tablet 1 tablet     1 tablet Oral Daily at bedtime 01/14/18 1130     01/14/18 1000  sulfamethoxazole-trimethoprim (BACTRIM,SEPTRA) 400-80 MG per tablet 1 tablet  Status:  Discontinued     1 tablet Oral Daily 01/13/18 2050 01/14/18 1130   01/14/18 0800  Darunavir-Cobicisctat-Emtricitabine-Tenofovir Alafenamide (SYMTUZA) 800-150-200-10 MG TABS 1 tablet     1 tablet Oral Daily with breakfast 01/13/18 2050     01/14/18 0800  dolutegravir (TIVICAY) tablet 50 mg     50 mg Oral Daily with breakfast 01/13/18 2215     01/14/18 0800  rilpivirine (EDURANT) tablet 25 mg     25 mg Oral Daily with breakfast 01/13/18 2215     01/14/18 0600  piperacillin-tazobactam (ZOSYN) IVPB 3.375 g     3.375 g 12.5 mL/hr over 240 Minutes Intravenous Every 12 hours 01/14/18 0147     01/13/18 1730  piperacillin-tazobactam (ZOSYN) IVPB 3.375 g     3.375 g 100 mL/hr over 30 Minutes Intravenous  Once 01/13/18 1716 01/13/18 2044      Lab Results:  Recent Labs    01/13/18 1552 01/13/18 1600 01/14/18 0408  WBC 5.5  --  7.3  HGB 9.7* 9.5* 9.9*  HCT 28.9* 28.0* 29.2*  PLT 183  --  163   BMET Recent Labs    01/13/18 1552 01/13/18 1600 01/14/18 0408  NA 137 137 136  K 4.9 5.2* 4.4  CL 106 104 103  CO2 18*  --  17*  GLUCOSE 91 94 76  BUN 84* 84* 91*  CREATININE 15.89* 17.00* 16.32*  CALCIUM 8.5*  --  8.1*   PT/INR Recent Labs    01/13/18 1552  LABPROT 16.2*  INR 1.31   CMP     Component Value Date/Time   NA 136 01/14/2018 0408   K 4.4 01/14/2018 0408   CL 103 01/14/2018 0408   CO2 17 (L) 01/14/2018 0408   GLUCOSE 76 01/14/2018 0408   BUN 91 (H) 01/14/2018 0408   CREATININE 16.32 (H) 01/14/2018 0408   CREATININE 7.81 (H) 05/17/2017 1113   CALCIUM 8.1 (L) 01/14/2018 0408   CALCIUM 7.4 (L) 04/30/2017 0408   PROT 7.5 01/13/2018 1552   ALBUMIN 2.8 (L) 01/14/2018 0408   AST 185 (H) 01/13/2018 1552   ALT 191 (H)  01/13/2018 1552   ALKPHOS 96 01/13/2018 1552   BILITOT 0.7 01/13/2018 1552   GFRNONAA 3 (L) 01/14/2018 0408   GFRAA 4 (L) 01/14/2018 0408   Lipase     Component Value Date/Time   LIPASE 72 (H) 01/13/2018 1552    Studies/Results: Dg Chest 2 View  Result Date: 01/13/2018 CLINICAL DATA:  Left-sided chest pain. EXAM: CHEST - 2 VIEW COMPARISON:  01/11/2018 FINDINGS: Stable moderate cardiac enlargement. Mild amount of fissural thickening again noted in the lateral projection, likely reflecting a small amount of pleural fluid in the fissures. There may be mild pulmonary interstitial edema bilaterally. There is no evidence of airspace edema, consolidation, pneumothorax or nodule. IMPRESSION: Cardiac enlargement and possible mild pulmonary interstitial edema. Electronically Signed   By: Irish LackGlenn  Yamagata M.D.   On: 01/13/2018 16:20   [place] Abdomen Limited Ruq  Result Date: 01/13/2018 CLINICAL DATA:  37 year old male with right upper quadrant pain for the past week. Subsequent encounter. EXAM: ULTRASOUND ABDOMEN LIMITED RIGHT UPPER QUADRANT COMPARISON:  01/11/2018 ultrasound. FINDINGS: Gallbladder: 2.4 cm gallstone lodged in the gallbladder neck. Marked gallbladder wall thickening measuring up to 1.4 cm. Patient was tender over the gallbladder during scanning. Common bile duct: Diameter: 3.3 mm. Liver: Heterogeneous without focal mass. Portal vein is patent on color Doppler imaging with normal direction of blood flow towards the liver. Small right pleural effusion. Right renal parenchyma increased echogenicity consistent with patient's history of renal failure. IMPRESSION: 2.4 cm gallstone lodged in the gallbladder neck. Marked gallbladder wall thickening measuring up to 1.4 cm (as previously noted). Patient was tender over the gallbladder during scanning. Findings consistent with acute cholecystitis. Small right-sided pleural effusion. Right renal parenchyma increased echogenicity consistent with patient's  history of renal failure. These results were called by telephone at the time of interpretation on 01/13/2018 at 5:43 pm to Dr. Shaune PollackAMERON ISAACS , who verbally acknowledged these results. Electronically Signed   By: Lacy DuverneySteven  Olson M.D.   On: 01/13/2018 17:46      Jerre SimonJessica L Daelynn Blower , [facility] 01/14/2018, 2:44 PM  Pager: [phone] Mon-Wed, Friday 7:00am-4:30pm Thurs 7am-11:30am  Consults: [phone]

## 2018-01-15 DIAGNOSIS — K810 Acute cholecystitis: Secondary | ICD-10-CM

## 2018-01-15 DIAGNOSIS — R740 Nonspecific elevation of levels of transaminase and lactic acid dehydrogenase [LDH]: Secondary | ICD-10-CM

## 2018-01-15 DIAGNOSIS — B20 Human immunodeficiency virus [HIV] disease: Secondary | ICD-10-CM

## 2018-01-15 DIAGNOSIS — N186 End stage renal disease: Secondary | ICD-10-CM

## 2018-01-15 DIAGNOSIS — Z978 Presence of other specified devices: Secondary | ICD-10-CM

## 2018-01-15 DIAGNOSIS — F17210 Nicotine dependence, cigarettes, uncomplicated: Secondary | ICD-10-CM

## 2018-01-15 DIAGNOSIS — Z992 Dependence on renal dialysis: Secondary | ICD-10-CM

## 2018-01-15 DIAGNOSIS — Z91018 Allergy to other foods: Secondary | ICD-10-CM

## 2018-01-15 LAB — LIPID PANEL
CHOLESTEROL: 98 mg/dL (ref 0–200)
HDL: 18 mg/dL — AB (ref >40)
LDL Cholesterol: 63 mg/dL (ref 0–99)
TRIGLYCERIDES: 85 mg/dL (ref <150)
Total CHOL/HDL Ratio: 5.4 RATIO
VLDL: 17 mg/dL (ref 0–40)

## 2018-01-15 LAB — CBC
HCT: 28.5 % — ABNORMAL LOW (ref 39.0–52.0)
Hemoglobin: 9.4 g/dL — ABNORMAL LOW (ref 13.0–17.0)
MCH: 30.5 pg (ref 26.0–34.0)
MCHC: 33 g/dL (ref 30.0–36.0)
MCV: 92.5 fL (ref 78.0–100.0)
Platelets: 141 10*3/uL — ABNORMAL LOW (ref 150–400)
RBC: 3.08 MIL/uL — ABNORMAL LOW (ref 4.22–5.81)
RDW: 16.3 % — ABNORMAL HIGH (ref 11.5–15.5)
WBC: 6.5 10*3/uL (ref 4.0–10.5)

## 2018-01-15 LAB — RENAL FUNCTION PANEL
Albumin: 2.6 g/dL — ABNORMAL LOW (ref 3.5–5.0)
Anion gap: 11 (ref 5–15)
BUN: 57 mg/dL — ABNORMAL HIGH (ref 6–20)
CO2: 26 mmol/L (ref 22–32)
Calcium: 8.1 mg/dL — ABNORMAL LOW (ref 8.9–10.3)
Chloride: 99 mmol/L — ABNORMAL LOW (ref 101–111)
Creatinine, Ser: 12.84 mg/dL — ABNORMAL HIGH (ref 0.61–1.24)
GFR calc Af Amer: 5 mL/min — ABNORMAL LOW (ref >60)
GFR calc non Af Amer: 4 mL/min — ABNORMAL LOW (ref >60)
Glucose, Bld: 94 mg/dL (ref 65–99)
Phosphorus: 4.8 mg/dL — ABNORMAL HIGH (ref 2.5–4.6)
Potassium: 3.8 mmol/L (ref 3.5–5.1)
Sodium: 136 mmol/L (ref 135–145)

## 2018-01-15 LAB — HEPATIC FUNCTION PANEL
ALT: 239 U/L — ABNORMAL HIGH (ref 17–63)
AST: 182 U/L — ABNORMAL HIGH (ref 15–41)
Albumin: 2.6 g/dL — ABNORMAL LOW (ref 3.5–5.0)
Alkaline Phosphatase: 75 U/L (ref 38–126)
Bilirubin, Direct: 0.2 mg/dL (ref 0.1–0.5)
Indirect Bilirubin: 0.7 mg/dL (ref 0.3–0.9)
Total Bilirubin: 0.9 mg/dL (ref 0.3–1.2)
Total Protein: 7 g/dL (ref 6.5–8.1)

## 2018-01-15 LAB — T-HELPER CELLS (CD4) COUNT (NOT AT ARMC)
CD4 % Helper T Cell: 5 % — ABNORMAL LOW (ref 33–55)
CD4 T Cell Abs: 30 /uL — ABNORMAL LOW (ref 400–2700)

## 2018-01-15 NOTE — Progress Notes (Addendum)
Subjective: Grumpy and withdrawn but does answer questions appropriately. Begging to drink liquids.  Denies nausea. Has some pain but not bad. CD4 pending WBC 6500.  Hemoglobin 9.9.  BUN 57.  Creatinine 12.84.  LFTs not done today   Objective: Vital signs in last 24 hours: Temp:  [98.4 F (36.9 C)-99.6 F (37.6 C)] 99.6 F (37.6 C) (04/24 0550) Pulse Rate:  [90-101] 96 (04/24 0550) Resp:  [17-20] 17 (04/24 0550) BP: (130-177)/(67-127) 130/90 (04/24 0550) SpO2:  [98 %-100 %] 98 % (04/24 0550) Weight:  [111.8 kg (246 lb 7.6 oz)-115.2 kg (253 lb 15.5 oz)] 111.8 kg (246 lb 7.6 oz) (04/23 1826) Last BM Date: 01/13/18  Intake/Output from previous day: 04/23 0701 - 04/24 0700 In: 100 [IV Piggyback:100] Out: 3421  Intake/Output this shift: No intake/output data recorded.  General appearance: Withdrawn but arousable and appropriate.  Does not appear toxic at all. Resp: clear to auscultation bilaterally GI: Soft.  Nondistended.  Tender with some guarding in the right upper quadrant.  No mass.  Lab Results:  Recent Labs    01/14/18 0408 01/15/18 0310  WBC 7.3 6.5  HGB 9.9* 9.4*  HCT 29.2* 28.5*  PLT 163 141*   BMET Recent Labs    01/14/18 0408 01/15/18 0310  NA 136 136  K 4.4 3.8  CL 103 99*  CO2 17* 26  GLUCOSE 76 94  BUN 91* 57*  CREATININE 16.32* 12.84*  CALCIUM 8.1* 8.1*   PT/INR Recent Labs    01/13/18 1552  LABPROT 16.2*  INR 1.31   ABG No results for input(s): PHART, HCO3 in the last 72 hours.  Invalid input(s): PCO2, PO2  Studies/Results: Dg Chest 2 View  Result Date: 01/13/2018 CLINICAL DATA:  Left-sided chest pain. EXAM: CHEST - 2 VIEW COMPARISON:  01/11/2018 FINDINGS: Stable moderate cardiac enlargement. Mild amount of fissural thickening again noted in the lateral projection, likely reflecting a small amount of pleural fluid in the fissures. There may be mild pulmonary interstitial edema bilaterally. There is no evidence of airspace edema,  consolidation, pneumothorax or nodule. IMPRESSION: Cardiac enlargement and possible mild pulmonary interstitial edema. Electronically Signed   By: Irish LackGlenn  Yamagata M.D.   On: 01/13/2018 16:20   [place] Abdomen Limited Ruq  Result Date: 01/13/2018 CLINICAL DATA:  37 year old male with right upper quadrant pain for the past week. Subsequent encounter. EXAM: ULTRASOUND ABDOMEN LIMITED RIGHT UPPER QUADRANT COMPARISON:  01/11/2018 ultrasound. FINDINGS: Gallbladder: 2.4 cm gallstone lodged in the gallbladder neck. Marked gallbladder wall thickening measuring up to 1.4 cm. Patient was tender over the gallbladder during scanning. Common bile duct: Diameter: 3.3 mm. Liver: Heterogeneous without focal mass. Portal vein is patent on color Doppler imaging with normal direction of blood flow towards the liver. Small right pleural effusion. Right renal parenchyma increased echogenicity consistent with patient's history of renal failure. IMPRESSION: 2.4 cm gallstone lodged in the gallbladder neck. Marked gallbladder wall thickening measuring up to 1.4 cm (as previously noted). Patient was tender over the gallbladder during scanning. Findings consistent with acute cholecystitis. Small right-sided pleural effusion. Right renal parenchyma increased echogenicity consistent with patient's history of renal failure. These results were called by telephone at the time of interpretation on 01/13/2018 at 5:43 pm to Dr. Shaune PollackAMERON ISAACS , who verbally acknowledged these results. Electronically Signed   By: Lacy DuverneySteven  Olson M.D.   On: 01/13/2018 17:46    Anti-infectives: Anti-infectives (From admission, onward)   Start     Dose/Rate Route Frequency Ordered Stop   01/14/18  1700  Dolutegravir-Rilpivirine 50-25 MG TABS 1 tablet  Status:  Discontinued     1 tablet Oral Daily with supper 01/13/18 2050 01/13/18 2214   01/14/18 1130  sulfamethoxazole-trimethoprim (BACTRIM,SEPTRA) 400-80 MG per tablet 1 tablet     1 tablet Oral Daily at bedtime  01/14/18 1130     01/14/18 1000  sulfamethoxazole-trimethoprim (BACTRIM,SEPTRA) 400-80 MG per tablet 1 tablet  Status:  Discontinued     1 tablet Oral Daily 01/13/18 2050 01/14/18 1130   01/14/18 0800  Darunavir-Cobicisctat-Emtricitabine-Tenofovir Alafenamide (SYMTUZA) 800-150-200-10 MG TABS 1 tablet     1 tablet Oral Daily with breakfast 01/13/18 2050     01/14/18 0800  dolutegravir (TIVICAY) tablet 50 mg     50 mg Oral Daily with breakfast 01/13/18 2215     01/14/18 0800  rilpivirine (EDURANT) tablet 25 mg     25 mg Oral Daily with breakfast 01/13/18 2215     01/14/18 0600  piperacillin-tazobactam (ZOSYN) IVPB 3.375 g     3.375 g 12.5 mL/hr over 240 Minutes Intravenous Every 12 hours 01/14/18 0147     01/13/18 1730  piperacillin-tazobactam (ZOSYN) IVPB 3.375 g     3.375 g 100 mL/hr over 30 Minutes Intravenous  Once 01/13/18 1716 01/13/18 2044      Assessment/Plan:  Acute cholecystitis with cholelithiasis. Ideal intervention would be laparoscopic cholecystectomy, but given noncompliance with HIV medicines and issues with dialysis, percutaneous cholecystostomy will need to be considered For today, allow clear liquids.  N.p.o. after midnight Await CD4 count Is CD4 count acceptable consider laparoscopic cholecystectomy tomorrow If counts are low then likely  percutaneous cholecystostomy tomorrow.  Continue Bactrim and Zosyn and antivirals Lab work in a.m.  Care plan discussed with patient  HIV.  Noncompliant.  CD4 count pending ESRD on HD -fistulogram pending to assess low flow rates during dialysis HTN   LOS: 2 days    Ernestene Mention 01/15/2018

## 2018-01-15 NOTE — Progress Notes (Signed)
Pt spiked a temperature of 101.4, pt is asymptomatic and resting in bed, given PRN 650mg  tylenol, rechecked temperature an hour later and temperature is 99.8. Will continue to monitor pt.

## 2018-01-15 NOTE — Progress Notes (Signed)
Went by to go over [employer].  He has the forms and I shared for him to read over and decide if he would like to fill these forms out while here in the hospital.  After asking if he would like to have prayer, had prayer with him.  If he wants to fill out the AD he will need to have a consult put in or page to a chaplain.  Phebe CollaDonna S Jolynne Spurgin, Chaplain   01/15/18 1500  Clinical Encounter Type  Visited With Patient  Visit Type Initial  Referral From Physician  Consult/Referral To Chaplain  Spiritual Encounters  Spiritual Needs Prayer  Stress Factors  Patient Stress Factors None identified  Family Stress Factors None identified

## 2018-01-15 NOTE — Progress Notes (Signed)
ID Progress Note Update:   Lab Results  Component Value Date   CD4TCELL 5 (L) 01/14/2018   CD4TABS 30 (L) 01/14/2018   CD4 count returned at 30 where he was previously at 155 in November. Often we can see in an acute drop in an acute state of stress/infection where CD4 lower than actual trend.   Would still recommend to proceed with lap cholecystectomy.   Thank you very much to the general surgery team.   Rexene AlbertsStephanie Dixon, MSN, NP-C [facility] Office: [phone] Pager: [phone]  01/15/2018  4:23 PM

## 2018-01-15 NOTE — Progress Notes (Signed)
Subjective: James Schwartz was seen resting in his bed this morning. He state he is feeling okay but is hungry and want to know if he will be able to eat today. He was able to undergo HD yesterday. He has no other questions or complaints Will likely be having surgery tomorrow considering ID clearance.  Objective:  Vital signs in last 24 hours: Vitals:   01/14/18 1800 01/14/18 1826 01/14/18 2002 01/15/18 0550  BP: (!) 142/100 (!) 146/96 (!) 137/92 130/90  Pulse: 96 97 (!) 101 96  Resp: 17 18 17 17   Temp:  99.3 F (37.4 C) 99 F (37.2 C) 99.6 F (37.6 C)  TempSrc:  Oral Oral Oral  SpO2:  100% 100% 98%  Weight:  246 lb 7.6 oz (111.8 kg)    Height:       Physical Exam  Constitutional: He is oriented to person, place, and time. He appears well-developed and well-nourished.  HENT:  Head: Normocephalic and atraumatic.  Cardiovascular: Normal rate, regular rhythm, normal heart sounds and intact distal pulses.  Pulmonary/Chest: Effort normal and breath sounds normal. No respiratory distress.  Abdominal: Soft. Bowel sounds are normal.  Tender to palpation of RUQ and epigastrum  Musculoskeletal: He exhibits no edema or deformity.  Neurological: He is alert and oriented to person, place, and time.  Skin: Skin is warm and dry.   Assessment/Plan:  Acute Cholecystitis: Presented w/ 6d of abd pain and ED visit 2 days prior for worsening abdominal pain. RUQ [place] consistent with acute cholecystitis on 4/20, but patient declined admission. Repeat RUQ [place] 4/22 re-demonstrated gall bladder wall thickening with new 2.4 cm gallstone lodged in the gallbladder neck, consistent with acute cholecystitis. LFTs: AST 185, ALTPatient also has elevated transaminases. He is currently afebrile and hemodynamically stable. He has been seen and evaluated by surgery who started the patient on zosyn.  - General surgery consulted, appreciate their recommendations - Surgery requesting recommendation from ID considering  patient's HIV and recent lower CD4 count - No surgery today, full liquid diet, NPO at midnight - Okay for surgery from ID perspective - AM CBC and CMP - Continue Zosyn per pharmacy - SCDs  Chest Pain: Chest pain with atypical and typical features present on admission. Pain was relieved by nitro. Risk factors include smoking history, HIV, and obesity. With patients age and history, suspicion for ACS is lower, but troponin was elevated and EKG showed T-wave inversion in V5 and V5. > Remain Chest Pain free > Troponin trend platued: 035>>0.28>>0.29 - Will continue to monitor for chest pain here, will likely need stress test outpatient - Cardiac Monitoring  HIV/AIDS: Diagnosed in 2002. History of medication non-adherence. Currently on Symtuza (Darunavir-Cobicisctat-Emtricitabine-Tenofovir Alafenamide), Juluca (Dolutegravir-Rilpivirine), and Bactrim (for ppx). > Most recent CD4 count 155, VL 6870. (Nov 2018) - Infectious disease consulted, appreciate their recommendations - Will continue home medications  - CD4, VL pending  ESRD on HD TThS: No dialysis since session on 4/16. On HD since 04/2017. K 4.9 on admission. CXR with mild pulmonary edema, appears largely euvolemic on examination.  > Issues with cannulating fistula at last 2 dialysis sessions - Nephrology consulted for HD, appreciate recommendations - Fistulogram   HTN: BP 130s-150s following HD. Home regimen includes metoprolol succinate 75 mg daily. Unclear if patient has been taking his BP medications.  - Metoprolol 75mg  Daily - Will continue to monitor  FEN: Soft Diet, NPO MN VTE ppx: SCDs Code Status: FULL  Dispo: Anticipated discharge in approximately 2-5 day(s).  James Schwartz, James Tegethoff, MD 01/15/2018, 7:26 AM Pager: [phone]

## 2018-01-15 NOTE — Progress Notes (Signed)
[facility] Progress Note   Subjective:  HD yesterday net UF 3.4L, decreased BFR noted via AVF. Fistulogram ordered Surgery following for possible cholecystectomy  No c/os this morning  Objective Vitals:   01/14/18 1826 01/14/18 2002 01/15/18 0550 01/15/18 0931  BP: (!) 146/96 (!) 137/92 130/90 (!) 123/95  Pulse: 97 (!) 101 96 93  Resp: 18 17 17    Temp: 99.3 F (37.4 C) 99 F (37.2 C) 99.6 F (37.6 C)   TempSrc: Oral Oral Oral   SpO2: 100% 100% 98%   Weight: 111.8 kg (246 lb 7.6 oz)     Height:       Physical Exam General: WNWD male NAD Heart: RRR Lungs: CTAB Abdomen: soft NT Extremities: No LE edema  Dialysis Access: RUE AVF+bruit   Dialysis Orders:  [facility] 4.5h 200NRe 550/800 EDW 115kg 2K/2.5Ca  R AVF No heparin  Mircera IV q 2 weeks ( dosed 4/4) Calcitriol 0.81mcg PO TIW   Assessment/Plan: 1. Acute cholecystitis - Surgery following. For possible laparoscopic cholecystectomy pending CD4 count   Antibiotics per pharmacy 2.  ESRD -  TTS. Continue on schedule 3. Vascular access - Unable to received his last 2 outpatient treatments due to hematoma over AVF, appears to have improved and  was able to be cannulated on HD yesterday but decreased BFR noted. Fistulogram ordered.  4.  Hypertension/volume  - BP controlled Improved with meds/HD. Now below his EDW post HD wt 111.8kg  5.  Anemia  - Hgb 9.4 Resume ESA. Aranesp 100 next HD  6.  Metabolic bone disease -  Continue Calcitriol/Renvela binder when diet resumes  7.  HIV - ART per primary/ID    James Blase PA-C [facility] Pager [phone] 01/15/2018,10:34 AM  LOS: 2 days   Additional Objective Labs: Basic Metabolic Panel: Recent Labs  Lab 01/13/18 1552 01/13/18 1600 01/14/18 0408 01/15/18 0310  NA 137 137 136 136  K 4.9 5.2* 4.4 3.8  CL 106 104 103 99*  CO2 18*  --  17* 26  GLUCOSE 91 94 76 94  BUN 84* 84* 91* 57*  CREATININE 15.89* 17.00* 16.32*  12.84*  CALCIUM 8.5*  --  8.1* 8.1*  PHOS  --   --  6.9* 4.8*   CBC: Recent Labs  Lab 01/11/18 1451 01/13/18 1552 01/13/18 1600 01/14/18 0408 01/15/18 0310  WBC 4.8 5.5  --  7.3 6.5  NEUTROABS  --  2.8  --   --   --   HGB 9.9* 9.7* 9.5* 9.9* 9.4*  HCT 29.7* 28.9* 28.0* 29.2* 28.5*  MCV 92.5 92.9  --  93.3 92.5  PLT 139* 183  --  163 141*   Blood Culture    Component Value Date/Time   SDES URINE, RANDOM 04/28/2017 1447   SPECREQUEST NONE 04/28/2017 1447   CULT <10,000 COLONIES/mL INSIGNIFICANT GROWTH (A) 04/28/2017 1447   REPTSTATUS 04/29/2017 FINAL 04/28/2017 1447    Cardiac Enzymes: Recent Labs  Lab 01/13/18 1552 01/13/18 2355 01/14/18 0408  TROPONINI 0.35* 0.28* 0.29*   CBG: No results for input(s): GLUCAP in the last 168 hours. Iron Studies: No results for input(s): IRON, TIBC, TRANSFERRIN, FERRITIN in the last 72 hours. Lab Results  Component Value Date   INR 1.31 01/13/2018   INR 1.16 04/28/2017   Medications: . piperacillin-tazobactam (ZOSYN)  IV Stopped (01/15/18 0933)   . [START ON 01/16/2018] calcitRIOL  0.5 mcg Oral Q T,Th,Sa-HD  . [START ON 01/16/2018] darbepoetin (ARANESP) injection - DIALYSIS  100 mcg Intravenous Q Thu-HD  . Darunavir-Cobicisctat-Emtricitabine-Tenofovir Alafenamide  1 tablet Oral Q breakfast  . dolutegravir  50 mg Oral Q breakfast   And  . rilpivirine  25 mg Oral Q breakfast  . metoprolol succinate  25 mg Oral Daily  . multivitamin  1 tablet Oral QHS  . sevelamer carbonate  1,600 mg Oral TID WC  . sulfamethoxazole-trimethoprim  1 tablet Oral QHS

## 2018-01-15 NOTE — Progress Notes (Addendum)
Patient ID: James Schwartz, male   DOB: Feb 14, 1981, 37 y.o.   MRN: [id number]   Request received for percutaneous cholecystostomy drain placement  Reviewed with Dr Loreta AveWagner  See Rhona RaiderFocht PA note; Dr Loreta AveWagner spoke to her directly yesterday approx 415pm Atypical indication for chole drain Request appears to be discontinued at this point  Will await ID note and further delineation from Dr Fredricka Bonineonnor if IR needed

## 2018-01-15 NOTE — Consult Note (Signed)
[facility]    Date of Admission:  01/13/2018     Total days of antibiotics 2   Day 2 zosyn                Reason for Consult: HIV/AIDS, acute cholecystitis     Referring Provider: Sandre Kitty  Primary Care Provider: Patient, No Pcp Per    Assessment: 37 y.o. male with HIV/AIDS on dialysis admitted with acute cholecystitis. He has been started on zosyn and surgical/IR team trying to decide on how to proceed lap chole vs perc drain. We called to verify with his pharmacy and for the last 3 months he has had appropriate fills that indicate he has had good compliance with his HAART. He healed well from AVG placement with a much lower CD4 count in the past and he has had some good reconstitution in his immune system from 30 --> 150.  Discussed with Dr. Ninetta Lights - would recommend to proceed with lap chole in this patient.  He has had HIV a long time and occasionally there is not robust reconstitution with CD4 even with viral suppression; he may be one that stays chronically < 200. I do not foresee him to do well with a perc drain outpatient if we can avoid this.   Great appreciation to the surgery and IM team for the care provided to James Schwartz. We are available as needed for his hospitalization. I will also arrange follow up in a few weeks with me in ID clinic.   Plan: 1. OK for laparoscopic cholecystectomy from ID perspective.  2. Will also check viral load today.   Follow up scheduled 02/20/18 @ 11:00  Principal Problem:   Acute cholecystitis due to biliary calculus Active Problems:   ESRD (end stage renal disease) (HCC)   Dialysis patient [facility])   Acute cholecystitis   Demand ischemia of myocardium (HCC)   . [START ON 01/16/2018] calcitRIOL  0.5 mcg Oral Q T,Th,Sa-HD  . [START ON 01/16/2018] darbepoetin (ARANESP) injection - DIALYSIS  100 mcg Intravenous Q Thu-HD  . Darunavir-Cobicisctat-Emtricitabine-Tenofovir Alafenamide  1 tablet Oral Q breakfast    . dolutegravir  50 mg Oral Q breakfast   And  . rilpivirine  25 mg Oral Q breakfast  . metoprolol succinate  25 mg Oral Daily  . multivitamin  1 tablet Oral QHS  . sevelamer carbonate  1,600 mg Oral TID WC  . sulfamethoxazole-trimethoprim  1 tablet Oral QHS    HPI: James Schwartz is a 37 y.o. male with ESRD on HD with HIV/AIDS admitted to the hospital on 01/13/2018 with abdominal pain, fever and nausea for a few days. CT scan found to have acute cholecystitis. He takes [place] for his HIV infection.   He follows with me in ID clinic and has missed a few appointments with me recently due to transportation issues. He has been on and off with compliance historically when he first came to [place] last year. Since moving to [place] he has worked with our Veterinary surgeon to help with care retention. His viral load has since August come down slowly and CD4 recovered from 30 to 155.   HIV 1 RNA Quant (copies/mL)  Date Value  08/05/2017 6,870 (H)  06/21/2017 12,000 (H)  04/28/2017 258,000   CD4 T Cell Abs (/uL)  Date Value  08/05/2017 155 (L)  04/28/2017 30 (L)    Review of Systems: Review of Systems  Constitutional:  Positive for fever. Negative for chills, malaise/fatigue and weight loss.  HENT: Negative for sore throat.   Respiratory: Negative for cough, sputum production and shortness of breath.   Cardiovascular: Negative.   Gastrointestinal: Negative for abdominal pain, diarrhea and vomiting.  Genitourinary:       ESRD  Musculoskeletal: Negative for joint pain, myalgias and neck pain.  Skin: Negative for rash.  Neurological: Negative for headaches.  Psychiatric/Behavioral: Negative for depression and substance abuse. The patient is not nervous/anxious.     Past Medical History:  Diagnosis Date  . Asthma   . ESRD (end stage renal disease) (HCC)   . HIV (human immunodeficiency virus infection) (HCC)   . Hypertension 04/28/2017    Social History   Tobacco Use  .  Smoking status: Current Every Day Smoker    Packs/day: 0.25    Types: Cigarettes    Start date: 09/24/2002  . Smokeless tobacco: Never Used  Substance Use Topics  . Alcohol use: No  . Drug use: No    Family History  Problem Relation Age of Onset  . Diabetes Mother    Allergies  Allergen Reactions  . Tomato Other (See Comments)    Mouth sores    OBJECTIVE: Blood pressure (!) 123/95, pulse 93, temperature 99.6 F (37.6 C), temperature source Oral, resp. rate 17, height 6\' 1"  (1.854 m), weight 246 lb 7.6 oz (111.8 kg), SpO2 98 %.  Physical Exam  Constitutional: He is oriented to person, place, and time. He appears well-developed and well-nourished.  Resting in bed.   HENT:  Mouth/Throat: No oral lesions. Normal dentition. No dental caries.  Eyes: No scleral icterus.  Cardiovascular: Normal rate, regular rhythm, normal heart sounds, intact distal pulses and normal pulses.  Pulmonary/Chest: Effort normal and breath sounds normal. He has no decreased breath sounds.  Abdominal: Soft. He exhibits no distension. There is no tenderness.  Musculoskeletal:  RUA AVG +thrill   Lymphadenopathy:    He has no cervical adenopathy.  Neurological: He is alert and oriented to person, place, and time.  Skin: Skin is warm and dry. No rash noted.  Nursing note and vitals reviewed.   Lab Results Lab Results  Component Value Date   WBC 6.5 01/15/2018   HGB 9.4 (L) 01/15/2018   HCT 28.5 (L) 01/15/2018   MCV 92.5 01/15/2018   PLT 141 (L) 01/15/2018    Lab Results  Component Value Date   CREATININE 12.84 (H) 01/15/2018   BUN 57 (H) 01/15/2018   NA 136 01/15/2018   K 3.8 01/15/2018   CL 99 (L) 01/15/2018   CO2 26 01/15/2018    Lab Results  Component Value Date   ALT 239 (H) 01/15/2018   AST 182 (H) 01/15/2018   ALKPHOS 75 01/15/2018   BILITOT 0.9 01/15/2018     Microbiology: Recent Results (from the past 240 hour(s))  MRSA PCR Screening     Status: None   Collection Time:  01/14/18  1:10 AM  Result Value Ref Range Status   MRSA by PCR NEGATIVE NEGATIVE Final    Comment:        The GeneXpert MRSA Assay (FDA approved for NASAL specimens only), is one component of a comprehensive MRSA colonization surveillance program. It is not intended to diagnose MRSA infection nor to guide or monitor treatment for MRSA infections. Performed at [facility], [address] [place], [place] [address]     Rexene AlbertsStephanie Dixon, MSN, NP-C [facility] Cell: [phone] Pager: [phone]  01/15/2018 9:58 AM

## 2018-01-16 ENCOUNTER — Encounter (HOSPITAL_COMMUNITY): Payer: Self-pay | Admitting: Certified Registered Nurse Anesthetist

## 2018-01-16 ENCOUNTER — Inpatient Hospital Stay (HOSPITAL_COMMUNITY): Payer: Medicaid Other

## 2018-01-16 ENCOUNTER — Inpatient Hospital Stay (HOSPITAL_COMMUNITY): Payer: Medicaid Other | Admitting: Certified Registered Nurse Anesthetist

## 2018-01-16 ENCOUNTER — Encounter (HOSPITAL_COMMUNITY)
Admission: EM | Disposition: A | Discharge status: Home / Self Care | Payer: Self-pay | Source: Home / Self Care | Attending: Internal Medicine

## 2018-01-16 HISTORY — PX: CHOLECYSTECTOMY: SHX55

## 2018-01-16 LAB — COMPREHENSIVE METABOLIC PANEL
ALT: 190 U/L — ABNORMAL HIGH (ref 17–63)
AST: 100 U/L — AB (ref 15–41)
Albumin: 2.4 g/dL — ABNORMAL LOW (ref 3.5–5.0)
Alkaline Phosphatase: 64 U/L (ref 38–126)
Anion gap: 13 (ref 5–15)
BILIRUBIN TOTAL: 0.8 mg/dL (ref 0.3–1.2)
BUN: 64 mg/dL — AB (ref 6–20)
CHLORIDE: 97 mmol/L — AB (ref 101–111)
CO2: 21 mmol/L — ABNORMAL LOW (ref 22–32)
Calcium: 8 mg/dL — ABNORMAL LOW (ref 8.9–10.3)
Creatinine, Ser: 15.24 mg/dL — ABNORMAL HIGH (ref 0.61–1.24)
GFR, EST AFRICAN AMERICAN: 4 mL/min — AB (ref >60)
GFR, EST NON AFRICAN AMERICAN: 4 mL/min — AB (ref >60)
Glucose, Bld: 95 mg/dL (ref 65–99)
POTASSIUM: 4.2 mmol/L (ref 3.5–5.1)
Sodium: 131 mmol/L — ABNORMAL LOW (ref 135–145)
TOTAL PROTEIN: 7 g/dL (ref 6.5–8.1)

## 2018-01-16 LAB — CBC
HEMATOCRIT: 28 % — AB (ref 39.0–52.0)
Hemoglobin: 9.3 g/dL — ABNORMAL LOW (ref 13.0–17.0)
MCH: 30.9 pg (ref 26.0–34.0)
MCHC: 33.2 g/dL (ref 30.0–36.0)
MCV: 93 fL (ref 78.0–100.0)
PLATELETS: 127 10*3/uL — AB (ref 150–400)
RBC: 3.01 MIL/uL — ABNORMAL LOW (ref 4.22–5.81)
RDW: 16.8 % — AB (ref 11.5–15.5)
WBC: 8.7 10*3/uL (ref 4.0–10.5)

## 2018-01-16 LAB — RPR: RPR Ser Ql: NONREACTIVE

## 2018-01-16 LAB — HIV-1 RNA QUANT-NO REFLEX-BLD
HIV 1 RNA Quant: 31300 copies/mL
LOG10 HIV-1 RNA: 4.496 log10copy/mL

## 2018-01-16 LAB — GLUCOSE, CAPILLARY: Glucose-Capillary: 90 mg/dL (ref 65–99)

## 2018-01-16 SURGERY — LAPAROSCOPIC CHOLECYSTECTOMY WITH INTRAOPERATIVE CHOLANGIOGRAM
Anesthesia: General | Site: Abdomen

## 2018-01-16 MED ORDER — BUPIVACAINE-EPINEPHRINE (PF) 0.25% -1:200000 IJ SOLN
INTRAMUSCULAR | Status: AC
  Filled 2018-01-16: qty 30

## 2018-01-16 MED ORDER — LIDOCAINE-PRILOCAINE 2.5-2.5 % EX CREA
1.0000 "application " | TOPICAL_CREAM | CUTANEOUS | Status: DC | PRN
  Filled 2018-01-16: qty 5

## 2018-01-16 MED ORDER — PENTAFLUOROPROP-TETRAFLUOROETH EX AERO: 1.0000 "application " | INHALATION_SPRAY | CUTANEOUS | Status: DC | PRN

## 2018-01-16 MED ORDER — ALTEPLASE 2 MG IJ SOLR
2.0000 mg | Freq: Once | INTRAMUSCULAR | Status: DC | PRN
  Filled 2018-01-16: qty 2

## 2018-01-16 MED ORDER — NEOSTIGMINE METHYLSULFATE 10 MG/10ML IV SOLN
INTRAVENOUS | Status: DC | PRN
  Administered 2018-01-16: 5 mg via INTRAVENOUS

## 2018-01-16 MED ORDER — SODIUM CHLORIDE 0.9 % IV SOLN
INTRAVENOUS | Status: DC
  Administered 2018-01-16 (×2): via INTRAVENOUS

## 2018-01-16 MED ORDER — DARBEPOETIN ALFA 100 MCG/0.5ML IJ SOSY
PREFILLED_SYRINGE | INTRAMUSCULAR | Status: AC
  Filled 2018-01-16: qty 0.5

## 2018-01-16 MED ORDER — LIDOCAINE HCL (PF) 1 % IJ SOLN
5.0000 mL | INTRAMUSCULAR | Status: DC | PRN
  Filled 2018-01-16: qty 5

## 2018-01-16 MED ORDER — ROCURONIUM BROMIDE 10 MG/ML (PF) SYRINGE
PREFILLED_SYRINGE | INTRAVENOUS | Status: DC | PRN
  Administered 2018-01-16: 50 mg via INTRAVENOUS
  Administered 2018-01-16: 20 mg via INTRAVENOUS

## 2018-01-16 MED ORDER — LIDOCAINE 2% (20 MG/ML) 5 ML SYRINGE
INTRAMUSCULAR | Status: AC
  Filled 2018-01-16: qty 5

## 2018-01-16 MED ORDER — SODIUM CHLORIDE 0.9 % IV SOLN: 100.0000 mL | INTRAVENOUS | Status: DC | PRN

## 2018-01-16 MED ORDER — PROPOFOL 10 MG/ML IV BOLUS
INTRAVENOUS | Status: AC
  Filled 2018-01-16: qty 20

## 2018-01-16 MED ORDER — MIDAZOLAM HCL 2 MG/2ML IJ SOLN
INTRAMUSCULAR | Status: DC | PRN
  Administered 2018-01-16: 2 mg via INTRAVENOUS

## 2018-01-16 MED ORDER — PROPOFOL 10 MG/ML IV BOLUS
INTRAVENOUS | Status: DC | PRN
  Administered 2018-01-16: 150 mg via INTRAVENOUS
  Administered 2018-01-16: 50 mg via INTRAVENOUS

## 2018-01-16 MED ORDER — FENTANYL CITRATE (PF) 250 MCG/5ML IJ SOLN
INTRAMUSCULAR | Status: AC
  Filled 2018-01-16: qty 5

## 2018-01-16 MED ORDER — MIDAZOLAM HCL 2 MG/2ML IJ SOLN: 0.5000 mg | Freq: Once | INTRAMUSCULAR | Status: DC | PRN

## 2018-01-16 MED ORDER — PROMETHAZINE HCL 25 MG/ML IJ SOLN
INTRAMUSCULAR | Status: AC
  Filled 2018-01-16: qty 1

## 2018-01-16 MED ORDER — MEPERIDINE HCL 50 MG/ML IJ SOLN: 6.2500 mg | INTRAMUSCULAR | Status: DC | PRN

## 2018-01-16 MED ORDER — DEXAMETHASONE SODIUM PHOSPHATE 10 MG/ML IJ SOLN
INTRAMUSCULAR | Status: AC
  Filled 2018-01-16: qty 1

## 2018-01-16 MED ORDER — SODIUM CHLORIDE 0.9 % IR SOLN
Status: DC | PRN
  Administered 2018-01-16: 1

## 2018-01-16 MED ORDER — BUPIVACAINE-EPINEPHRINE 0.25% -1:200000 IJ SOLN
INTRAMUSCULAR | Status: DC | PRN
  Administered 2018-01-16: 10 mL

## 2018-01-16 MED ORDER — HYDROMORPHONE HCL 1 MG/ML IJ SOLN
1.0000 mg | INTRAMUSCULAR | Status: DC | PRN
  Administered 2018-01-16: 1 mg via INTRAVENOUS
  Filled 2018-01-16: qty 1

## 2018-01-16 MED ORDER — ROCURONIUM BROMIDE 10 MG/ML (PF) SYRINGE
PREFILLED_SYRINGE | INTRAVENOUS | Status: AC
  Filled 2018-01-16: qty 5

## 2018-01-16 MED ORDER — ONDANSETRON HCL 4 MG/2ML IJ SOLN
INTRAMUSCULAR | Status: DC | PRN
  Administered 2018-01-16: 4 mg via INTRAVENOUS

## 2018-01-16 MED ORDER — GLYCOPYRROLATE 0.2 MG/ML IJ SOLN
INTRAMUSCULAR | Status: DC | PRN
  Administered 2018-01-16: .8 mg via INTRAVENOUS

## 2018-01-16 MED ORDER — 0.9 % SODIUM CHLORIDE (POUR BTL) OPTIME
TOPICAL | Status: DC | PRN
  Administered 2018-01-16: 1000 mL

## 2018-01-16 MED ORDER — HEPARIN SODIUM (PORCINE) 1000 UNIT/ML DIALYSIS
1000.0000 [IU] | INTRAMUSCULAR | Status: DC | PRN
  Filled 2018-01-16: qty 1

## 2018-01-16 MED ORDER — PHENYLEPHRINE 40 MCG/ML (10ML) SYRINGE FOR IV PUSH (FOR BLOOD PRESSURE SUPPORT)
PREFILLED_SYRINGE | INTRAVENOUS | Status: AC
  Filled 2018-01-16: qty 10

## 2018-01-16 MED ORDER — HYDROMORPHONE HCL 2 MG/ML IJ SOLN: 0.2500 mg | INTRAMUSCULAR | Status: DC | PRN

## 2018-01-16 MED ORDER — SUGAMMADEX SODIUM 200 MG/2ML IV SOLN
INTRAVENOUS | Status: AC
  Filled 2018-01-16: qty 2

## 2018-01-16 MED ORDER — LIDOCAINE-PRILOCAINE 2.5-2.5 % EX CREA: 1.0000 "application " | TOPICAL_CREAM | CUTANEOUS | Status: DC | PRN

## 2018-01-16 MED ORDER — FENTANYL CITRATE (PF) 250 MCG/5ML IJ SOLN
INTRAMUSCULAR | Status: DC | PRN
  Administered 2018-01-16: 50 ug via INTRAVENOUS
  Administered 2018-01-16: 100 ug via INTRAVENOUS
  Administered 2018-01-16: 50 ug via INTRAVENOUS

## 2018-01-16 MED ORDER — ACETAMINOPHEN 325 MG PO TABS
ORAL_TABLET | ORAL | Status: AC
  Filled 2018-01-16: qty 2

## 2018-01-16 MED ORDER — LIDOCAINE 2% (20 MG/ML) 5 ML SYRINGE
INTRAMUSCULAR | Status: DC | PRN
  Administered 2018-01-16: 60 mg via INTRAVENOUS

## 2018-01-16 MED ORDER — NEOSTIGMINE METHYLSULFATE 5 MG/5ML IV SOSY
PREFILLED_SYRINGE | INTRAVENOUS | Status: AC
  Filled 2018-01-16: qty 5

## 2018-01-16 MED ORDER — MIDAZOLAM HCL 2 MG/2ML IJ SOLN
INTRAMUSCULAR | Status: AC
  Filled 2018-01-16: qty 2

## 2018-01-16 MED ORDER — DEXAMETHASONE SODIUM PHOSPHATE 10 MG/ML IJ SOLN
INTRAMUSCULAR | Status: DC | PRN
  Administered 2018-01-16: 5 mg via INTRAVENOUS

## 2018-01-16 MED ORDER — SODIUM CHLORIDE 0.9 % IV SOLN
INTRAVENOUS | Status: DC | PRN
  Administered 2018-01-16: 17 mL

## 2018-01-16 MED ORDER — ONDANSETRON HCL 4 MG/2ML IJ SOLN
INTRAMUSCULAR | Status: AC
  Filled 2018-01-16: qty 2

## 2018-01-16 MED ORDER — PENTAFLUOROPROP-TETRAFLUOROETH EX AERO
INHALATION_SPRAY | CUTANEOUS | Status: AC
  Filled 2018-01-16: qty 103.5

## 2018-01-16 MED ORDER — PROMETHAZINE HCL 25 MG/ML IJ SOLN: 6.2500 mg | INTRAMUSCULAR | Status: DC | PRN

## 2018-01-16 MED ORDER — OXYCODONE HCL 5 MG PO TABS
5.0000 mg | ORAL_TABLET | ORAL | Status: DC | PRN
  Administered 2018-01-16: 5 mg via ORAL
  Filled 2018-01-16: qty 1

## 2018-01-16 SURGICAL SUPPLY — 42 items
APPLIER CLIP 5 13 M/L LIGAMAX5 (MISCELLANEOUS) ×3
CANISTER SUCT 3000ML PPV (MISCELLANEOUS) ×3 IMPLANT
CHLORAPREP W/TINT 26ML (MISCELLANEOUS) ×3 IMPLANT
CLIP APPLIE 5 13 M/L LIGAMAX5 (MISCELLANEOUS) ×1 IMPLANT
COVER MAYO STAND STRL (DRAPES) ×3 IMPLANT
COVER SURGICAL LIGHT HANDLE (MISCELLANEOUS) ×3 IMPLANT
DERMABOND ADHESIVE PROPEN (GAUZE/BANDAGES/DRESSINGS) ×2
DERMABOND ADVANCED (GAUZE/BANDAGES/DRESSINGS) ×2
DERMABOND ADVANCED .7 DNX12 (GAUZE/BANDAGES/DRESSINGS) ×1 IMPLANT
DERMABOND ADVANCED .7 DNX6 (GAUZE/BANDAGES/DRESSINGS) ×1 IMPLANT
DRAPE C-ARM 42X72 X-RAY (DRAPES) ×3 IMPLANT
ELECT REM PT RETURN 9FT ADLT (ELECTROSURGICAL) ×3
ELECTRODE REM PT RTRN 9FT ADLT (ELECTROSURGICAL) ×1 IMPLANT
GLOVE BIO SURGEON STRL SZ 6 (GLOVE) ×3 IMPLANT
GLOVE BIOGEL PI IND STRL 6.5 (GLOVE) ×1 IMPLANT
GLOVE BIOGEL PI IND STRL 7.0 (GLOVE) ×3 IMPLANT
GLOVE BIOGEL PI INDICATOR 6.5 (GLOVE) ×2
GLOVE BIOGEL PI INDICATOR 7.0 (GLOVE) ×6
GLOVE SURG SS PI 6.5 STRL IVOR (GLOVE) ×3 IMPLANT
GLOVE SURG SS PI 7.5 STRL IVOR (GLOVE) ×3 IMPLANT
GOWN STRL REUS W/ TWL LRG LVL3 (GOWN DISPOSABLE) ×3 IMPLANT
GOWN STRL REUS W/TWL LRG LVL3 (GOWN DISPOSABLE) ×6
GRASPER SUT TROCAR 14GX15 (MISCELLANEOUS) ×3 IMPLANT
KIT BASIN OR (CUSTOM PROCEDURE TRAY) ×3 IMPLANT
KIT TURNOVER KIT B (KITS) ×3 IMPLANT
NEEDLE INSUFFLATION 14GA 120MM (NEEDLE) ×3 IMPLANT
NS IRRIG 1000ML POUR BTL (IV SOLUTION) ×3 IMPLANT
PAD ARMBOARD 7.5X6 YLW CONV (MISCELLANEOUS) ×3 IMPLANT
POUCH SPECIMEN RETRIEVAL 10MM (ENDOMECHANICALS) ×3 IMPLANT
SCISSORS LAP 5X35 DISP (ENDOMECHANICALS) ×3 IMPLANT
SET CHOLANGIOGRAPH 5 50 .035 (SET/KITS/TRAYS/PACK) ×3 IMPLANT
SET IRRIG TUBING LAPAROSCOPIC (IRRIGATION / IRRIGATOR) ×3 IMPLANT
SLEEVE ENDOPATH XCEL 5M (ENDOMECHANICALS) ×3 IMPLANT
SPECIMEN JAR SMALL (MISCELLANEOUS) ×3 IMPLANT
SUT MNCRL AB 4-0 PS2 18 (SUTURE) ×3 IMPLANT
TOWEL OR 17X24 6PK STRL BLUE (TOWEL DISPOSABLE) ×3 IMPLANT
TOWEL OR 17X26 10 PK STRL BLUE (TOWEL DISPOSABLE) ×3 IMPLANT
TRAY LAPAROSCOPIC MC (CUSTOM PROCEDURE TRAY) ×3 IMPLANT
TROCAR XCEL NON-BLD 11X100MML (ENDOMECHANICALS) ×3 IMPLANT
TROCAR XCEL NON-BLD 5MMX100MML (ENDOMECHANICALS) ×3 IMPLANT
TUBING INSUFFLATION (TUBING) ×3 IMPLANT
WATER STERILE IRR 1000ML POUR (IV SOLUTION) ×3 IMPLANT

## 2018-01-16 NOTE — Anesthesia Preprocedure Evaluation (Addendum)
Anesthesia Evaluation  Patient identified by MRN, date of birth, ID band Patient awake    Reviewed: Allergy & Precautions, NPO status , Patient's Chart, lab work & pertinent test results, reviewed documented beta blocker date and time   History of Anesthesia Complications Negative for: history of anesthetic complications  Airway Mallampati: II  TM Distance: >3 FB Neck ROM: Full    Dental  (+) Dental Advisory Given   Pulmonary Current Smoker,    breath sounds clear to auscultation       Cardiovascular hypertension, Pt. on home beta blockers and Pt. on medications  Rhythm:Regular Rate:Normal     Neuro/Psych negative neurological ROS     GI/Hepatic Elevated LFTs with acute chole Acute cholecystitis   Endo/Other  Morbid obesity  Renal/GU Dialysis and ESRFRenal disease     Musculoskeletal   Abdominal (+) + obese,   Peds  Hematology  (+) Blood dyscrasia (Hb 9.3), anemia , HIV,   Anesthesia Other Findings   Reproductive/Obstetrics                            Anesthesia Physical Anesthesia Plan  ASA: III  Anesthesia Plan: General   Post-op Pain Management:    Induction: Intravenous  PONV Risk Score and Plan: 2 and Ondansetron and Dexamethasone  Airway Management Planned: Oral ETT  Additional Equipment:   Intra-op Plan:   Post-operative Plan: Extubation in OR  Informed Consent: I have reviewed the patients History and Physical, chart, labs and discussed the procedure including the risks, benefits and alternatives for the proposed anesthesia with the patient or authorized representative who has indicated his/her understanding and acceptance.   Dental advisory given  Plan Discussed with: CRNA and Surgeon  Anesthesia Plan Comments: (Plan routine monitors, GETA)        Anesthesia Quick Evaluation

## 2018-01-16 NOTE — Plan of Care (Signed)

## 2018-01-16 NOTE — Transfer of Care (Signed)
Immediate Anesthesia Transfer of Care Note  Patient: James Schwartz  Procedure(s) Performed: LAPAROSCOPIC CHOLECYSTECTOMY WITH INTRAOPERATIVE CHOLANGIOGRAM (N/A Abdomen)  Patient Location: PACU  Anesthesia Type:General  Level of Consciousness: lethargic and responds to stimulation  Airway & Oxygen Therapy: Patient Spontanous Breathing  Post-op Assessment: Report given to RN and Post -op Vital signs reviewed and stable  Post vital signs: Reviewed and stable  Last Vitals:  Vitals Value Taken Time  BP 124/91 01/16/2018  3:31 PM  Temp    Pulse 102 01/16/2018  3:33 PM  Resp 37 01/16/2018  3:33 PM  SpO2 92 % 01/16/2018  3:33 PM  Vitals shown include unvalidated device data.  Last Pain:  Vitals:   01/16/18 1303  TempSrc:   PainSc: 0-No pain         Complications: No apparent anesthesia complications

## 2018-01-16 NOTE — Procedures (Signed)
Did not get fistulogram, but on deck for today BFR 400 but diff with increased VP Feels OK except "hungry" Fever last PM No hemodynamic instability Casimiro NeedleAlvin Ashir Kunz, MD

## 2018-01-16 NOTE — Op Note (Signed)
Operative Note  James PercyMaurice Schwartz 37 y.o. male [id number]  01/16/2018  Surgeon: Berna Buehelsea A Samiya Mervin MD  Assistant: Wells GuilesKelly Rayburn, PA-C  Procedure performed: Laparoscopic Cholecystectomy  Preop diagnosis: cholecystitis Post-op diagnosis/intraop findings: same  Specimens: gallbladder  EBL: minimal  Complications: none  Description of procedure: After obtaining informed consent the patient was brought to the operating room. Patient is receiving antibiotics. SCD's were applied. General endotracheal anesthesia was initiated and a formal time-out was performed. The abdomen was prepped and draped in the usual sterile fashion and the abdomen was entered using an infraumbilical veress needle after instilling the site with local. Insufflation to 15mmHg was obtained and gross inspection revealed no evidence of injury from our entry or other intraabdominal abnormalities. Two 5mm trocars were introduced in the right midclavicular and right anterior axillary lines under direct visualization and following infiltration with local. An 11mm trocar was placed in the epigastrium. The gallbladder was retracted cephalad and the infundibulum was retracted laterally. A combination of hook electrocautery and blunt dissection was utilized to clear the peritoneum from the neck and cystic duct, circumferentially isolating the cystic artery and cystic duct and lifting the gallbladder from the cystic plate. The critical view of safety was achieved with the cystic artery, cystic duct, and liver bed visualized between them with no other structures. A clip was placed at the junction of the cystic duct and gallbladder neck and a ductotomy was made sharply. [facility] was introduced through a stab wound in the abdominal wall and directed down into the cystic duct. This was secured with a clip. Cholangiogram was performed and demonstrated normal biliary anatomy without any obstructive lesions, contrast flowed freely into the  duodenum. The catheter was removed. 3 clips were placed on the proximal cystic duct and the division was completed. The artery was clipped with two clips proximally and one distally and divided. The gallbladder was dissected from the liver plate using electrocautery. Once freed the gallbladder was placed in an endocatch bag and removed intact through the epigastric trocar site. A small amount of bleeding on the liver bed was controlled with cautery. Hemostasis was once again confirmed, and reinspection of the abdomen revealed no injuries. The clips were well opposed without any bile leak from the duct or the liver bed. The 11mm trocar site in the epigastrium was closed with a 0 vicryl in the fascia under direct visualization using a PMI device. The abdomen was desufflated and all trocars removed. The skin incisions were closed with running subcuticular monocryl and Dermabond. The patient was awakened, extubated and transported to the recovery room in stable condition.   All counts were correct at the completion of the case.

## 2018-01-16 NOTE — Progress Notes (Signed)
   01/16/18 1259  Vitals  Temp (!) 101.5 F (38.6 C)  Temp Source Rectal  Paged provider to make aware and provider advised okay to send patient to OR.

## 2018-01-16 NOTE — Progress Notes (Signed)
Subjective: James Schwartz was seen during his HD session this morning. He states he is feeling okay, but is a little nauseous. He is pleased that he is expected to have his cholecystectomy today as he is ready for his gall bladder to be removed. He endorses a fever over night as recorded in the chart, but states he had no worsening of pain or new symptoms. He continues to endorse hungry but was appreciative of have a full liquid diet yesterday to get something on his stomach. No further complaints or questions this morning.  Objective:  Vital signs in last 24 hours: Vitals:   01/16/18 1100 01/16/18 1117 01/16/18 1200 01/16/18 1259  BP: (!) 143/71 (!) 155/78    Pulse: 87 90    Resp:  16    Temp:  99.8 F (37.7 C) 100.2 F (37.9 C) (!) 101.5 F (38.6 C)  TempSrc:  Oral Oral Rectal  SpO2:  99%    Weight:  242 lb 11.6 oz (110.1 kg)    Height:       Physical Exam  Constitutional: He is oriented to person, place, and time. He appears well-developed and well-nourished.  HENT:  Head: Normocephalic and atraumatic.  Cardiovascular: Normal rate, regular rhythm, normal heart sounds and intact distal pulses.  Pulmonary/Chest: Effort normal and breath sounds normal. No respiratory distress.  Abdominal: Soft. Bowel sounds are normal. He exhibits no distension.  Tender to palpation of RUQ, mildly render to palpation of epigastrum  Musculoskeletal: He exhibits no edema or deformity.  Neurological: He is alert and oriented to person, place, and time.  Skin: Skin is warm and dry.   Assessment/Plan:  Acute Cholecystitis: Presented w/ 6d of abd pain and ED visit 2 days prior for worsening abdominal pain. RUQ [place] consistent with acute cholecystitis on 4/20, but patient declined admission. Repeat RUQ [place] 4/22 re-demonstrated gall bladder wall thickening with new 2.4 cm gallstone lodged in the gallbladder neck, consistent with acute cholecystitis. Patient also has elevated transaminases. Started on Zosyn on  admission. > Febrile to 100.6 over night and 101.5 this morning, improved with Tylenol, no leukocytosis, no worsening of symptoms; expect fevers to improve with source control following surgery - General surgery consulted, appreciate their recommendations - Patient cleared for surgery by ID - Lap Chole scheduled for later today - AM CBC and CMP - Continue Zosyn per pharmacy - SCDs  HIV/AIDS: Diagnosed in 2002. History of medication non-adherence. Currently on Symtuza (Darunavir-Cobicisctat-Emtricitabine-Tenofovir Alafenamide), Juluca (Dolutegravir-Rilpivirine), and Bactrim (for ppx). Last CD4 count 155, VL 6870. (Nov 2018) > CD4 30, VL Pending - Infectious disease consulted, appreciate their recommendations - Will continue home medications  ESRD on HD TThS: No dialysis since session on 4/16. On HD since 04/2017. K 4.9 on admission. CXR with mild pulmonary edema, appears largely euvolemic on examination.  > Recent Issues with cannulating fistula - Nephrology consulted for HD, appreciate recommendations - Fistulogram   Chest Pain: Resolved. Chest pain with atypical and typical features present on admission. Pain was relieved by nitro. Risk factors include smoking history, HIV, and obesity. With patients age and history, suspicion for ACS is lower, but troponin was elevated and EKG showed T-wave inversion in V5 and V5. > Remains Chest Pain free > Troponin trend platued: 035>>0.28>>0.29 - Will continue to monitor for chest pain here, will likely need stress test outpatient - Cardiac Monitoring  HTN: BP 110s-120s Last 24hrs. Home regimen includes metoprolol succinate 75mg  Daily. Unclear if patient has been taking his BP  medications at home.  - Metoprolol 75mg  Daily - Will continue to monitor  FEN: NPO VTE ppx: SCDs Code Status: FULL  Dispo: Anticipated discharge in approximately 2-4 day(s).   James Schwartz, Alexander, MD 01/16/2018, 1:00 PM Pager: [phone]

## 2018-01-16 NOTE — Progress Notes (Signed)
Day of Surgery  Subjective: Reports he is no longer having any pain. Bilirubin remains normal today, AST and ALT down some His HIV viral load is extremely high, CD4 count is 30 We have conferred with ID who recommend proceeding with cholecystectomy despite the above.  Objective: Vital signs in last 24 hours: Temp:  [99.6 F (37.6 C)-101.5 F (38.6 C)] 101.5 F (38.6 C) (04/25 1259) Pulse Rate:  [82-97] 97 (04/25 1303) Resp:  [16-18] 18 (04/25 1303) BP: (119-155)/(71-93) 124/89 (04/25 1303) SpO2:  [99 %-100 %] 100 % (04/25 1303) Weight:  [110.1 kg (242 lb 11.6 oz)-112.6 kg (248 lb 3.8 oz)] 112.4 kg (247 lb 12.8 oz) (04/25 1303) Last BM Date: 01/15/18  Intake/Output from previous day: 04/24 0701 - 04/25 0700 In: 692 [P.O.:642; IV Piggyback:50] Out: -  Intake/Output this shift: Total I/O In: -  Out: 2573 [Other:2573]  General appearance: Withdrawn but arousable and appropriate.  Does not appear toxic at all. Resp: clear to auscultation bilaterally GI: Soft.  Nondistended.  Tender with some guarding in the right upper quadrant.  No mass.  Lab Results:  Recent Labs    01/15/18 0310 01/16/18 0507  WBC 6.5 8.7  HGB 9.4* 9.3*  HCT 28.5* 28.0*  PLT 141* 127*   BMET Recent Labs    01/15/18 0310 01/16/18 0507  NA 136 131*  K 3.8 4.2  CL 99* 97*  CO2 26 21*  GLUCOSE 94 95  BUN 57* 64*  CREATININE 12.84* 15.24*  CALCIUM 8.1* 8.0*   PT/INR Recent Labs    01/13/18 1552  LABPROT 16.2*  INR 1.31   ABG No results for input(s): PHART, HCO3 in the last 72 hours.  Invalid input(s): PCO2, PO2  Studies/Results: No results found.  Anti-infectives: Anti-infectives (From admission, onward)   Start     Dose/Rate Route Frequency Ordered Stop   01/14/18 1700  Dolutegravir-Rilpivirine 50-25 MG TABS 1 tablet  Status:  Discontinued     1 tablet Oral Daily with supper 01/13/18 2050 01/13/18 2214   01/14/18 1130  [MAR Hold]  sulfamethoxazole-trimethoprim (BACTRIM,SEPTRA)  400-80 MG per tablet 1 tablet     (MAR Hold since Thu 01/16/2018 at 1323. Reason: Transfer to a Procedural area.)   1 tablet Oral Daily at bedtime 01/14/18 1130     01/14/18 1000  sulfamethoxazole-trimethoprim (BACTRIM,SEPTRA) 400-80 MG per tablet 1 tablet  Status:  Discontinued     1 tablet Oral Daily 01/13/18 2050 01/14/18 1130   01/14/18 0800  [MAR Hold]  Darunavir-Cobicisctat-Emtricitabine-Tenofovir Alafenamide (SYMTUZA) 800-150-200-10 MG TABS 1 tablet     (MAR Hold since Thu 01/16/2018 at 1323. Reason: Transfer to a Procedural area.)   1 tablet Oral Daily with breakfast 01/13/18 2050     01/14/18 0800  [MAR Hold]  dolutegravir (TIVICAY) tablet 50 mg     (MAR Hold since Thu 01/16/2018 at 1323. Reason: Transfer to a Procedural area.)   50 mg Oral Daily with breakfast 01/13/18 2215     01/14/18 0800  [MAR Hold]  rilpivirine (EDURANT) tablet 25 mg     (MAR Hold since Thu 01/16/2018 at 1323. Reason: Transfer to a Procedural area.)   25 mg Oral Daily with breakfast 01/13/18 2215     01/14/18 0600  [MAR Hold]  piperacillin-tazobactam (ZOSYN) IVPB 3.375 g     (MAR Hold since Thu 01/16/2018 at 1323. Reason: Transfer to a Procedural area.)   3.375 g 12.5 mL/hr over 240 Minutes Intravenous Every 12 hours 01/14/18 0147  01/13/18 1730  piperacillin-tazobactam (ZOSYN) IVPB 3.375 g     3.375 g 100 mL/hr over 30 Minutes Intravenous  Once 01/13/18 1716 01/13/18 2044      Assessment/Plan:  Acute cholecystitis with cholelithiasis. Certainly certainly not an ideal situation given viral load, however I do not think that it would be any better for him to have an indwelling drain for a prolonged amount of time in that setting. I discussed with him that he is at increased risk of complications specifically infectious. We also discussed risks of bleeding, pain, scarring, intra-abdominal injury specifically to the common bile duct and sequelae, conversion to open surgery, heart attack, pneumonia, stroke, blood clots,  failure to resolve symptoms, hernia, etc. Questions were welcomed and answered. He has expressed understanding that he is a very high risk candidate for any intervention. We will proceed with laparoscopic cholecystectomy this afternoon.  Continue Bactrim and Zosyn and antivirals   HIV.  Noncompliant.   ESRD on HD -fistulogram pending to assess low flow rates during dialysis HTN   LOS: 3 days    Berna Bue 01/16/2018

## 2018-01-16 NOTE — Anesthesia Procedure Notes (Signed)
Procedure Name: Intubation Date/Time: 01/16/2018 2:17 PM Performed by: White, Amedeo Plenty, CRNA Pre-anesthesia Checklist: Patient identified, Emergency Drugs available, Suction available and Patient being monitored Patient Re-evaluated:Patient Re-evaluated prior to induction Oxygen Delivery Method: Circle System Utilized Preoxygenation: Pre-oxygenation with 100% oxygen Induction Type: IV induction Ventilation: Mask ventilation without difficulty Laryngoscope Size: Mac and 4 Grade View: Grade I Tube type: Oral Tube size: 7.5 mm Number of attempts: 1 Airway Equipment and Method: Stylet and Oral airway Placement Confirmation: ETT inserted through vocal cords under direct vision,  positive ETCO2 and breath sounds checked- equal and bilateral Secured at: 23 cm Tube secured with: Tape Dental Injury: Teeth and Oropharynx as per pre-operative assessment

## 2018-01-16 NOTE — Plan of Care (Signed)
  Problem: Education: Goal: Knowledge of General Education information will improve Outcome: Adequate for Discharge   

## 2018-01-16 NOTE — Progress Notes (Signed)
         [facility]  Date of Admission:  01/13/2018     Patient ID: James Schwartz is a 37 y.o. male with  Principal Problem:   Acute cholecystitis due to biliary calculus Active Problems:   AIDS (HCC)   ESRD (end stage renal disease) (HCC)   Dialysis patient (HCC)   Acute cholecystitis   Demand ischemia of myocardium (HCC)   Lab Results  Component Value Date   CD4TCELL 5 (L) 01/14/2018   CD4TABS 30 (L) 01/14/2018   Patient with CD4 count down to 30 - could be reflective of acute situation given cholecystitis vs accurate indication of what he normally trends. Viral load is still pending at this time. Would agree to proceed with removal of gallbladder for [name].  D/W Shanda BumpsJessica, [place]PA with General Surgery and Dr. Ninetta LightsHatcher.  We appreciate general surgery's assistance in the care of [name].    Rexene AlbertsStephanie Markes Shatswell, MSN, NP-C [facility] Cell: [phone] Pager: [phone]

## 2018-01-17 ENCOUNTER — Inpatient Hospital Stay (HOSPITAL_COMMUNITY): Payer: Medicaid Other

## 2018-01-17 ENCOUNTER — Encounter (HOSPITAL_COMMUNITY): Payer: Self-pay

## 2018-01-17 DIAGNOSIS — Z9049 Acquired absence of other specified parts of digestive tract: Secondary | ICD-10-CM

## 2018-01-17 HISTORY — PX: IR DIALY SHUNT INTRO NEEDLE/INTRACATH INITIAL W/IMG RIGHT: IMG6115

## 2018-01-17 LAB — CBC
HEMATOCRIT: 31.2 % — AB (ref 39.0–52.0)
HEMOGLOBIN: 10.3 g/dL — AB (ref 13.0–17.0)
MCH: 31.2 pg (ref 26.0–34.0)
MCHC: 33 g/dL (ref 30.0–36.0)
MCV: 94.5 fL (ref 78.0–100.0)
Platelets: 134 10*3/uL — ABNORMAL LOW (ref 150–400)
RBC: 3.3 MIL/uL — AB (ref 4.22–5.81)
RDW: 17.2 % — AB (ref 11.5–15.5)
WBC: 15.7 10*3/uL — AB (ref 4.0–10.5)

## 2018-01-17 LAB — COMPREHENSIVE METABOLIC PANEL
ALBUMIN: 2.6 g/dL — AB (ref 3.5–5.0)
ALK PHOS: 64 U/L (ref 38–126)
ALT: 153 U/L — AB (ref 17–63)
AST: 62 U/L — AB (ref 15–41)
Anion gap: 12 (ref 5–15)
BILIRUBIN TOTAL: 0.7 mg/dL (ref 0.3–1.2)
BUN: 35 mg/dL — AB (ref 6–20)
CO2: 24 mmol/L (ref 22–32)
Calcium: 8.3 mg/dL — ABNORMAL LOW (ref 8.9–10.3)
Chloride: 97 mmol/L — ABNORMAL LOW (ref 101–111)
Creatinine, Ser: 10.26 mg/dL — ABNORMAL HIGH (ref 0.61–1.24)
GFR calc Af Amer: 7 mL/min — ABNORMAL LOW (ref >60)
GFR calc non Af Amer: 6 mL/min — ABNORMAL LOW (ref >60)
GLUCOSE: 164 mg/dL — AB (ref 65–99)
POTASSIUM: 4.5 mmol/L (ref 3.5–5.1)
Sodium: 133 mmol/L — ABNORMAL LOW (ref 135–145)
TOTAL PROTEIN: 7.7 g/dL (ref 6.5–8.1)

## 2018-01-17 MED ORDER — AMOXICILLIN-POT CLAVULANATE 500-125 MG PO TABS: 1.0000 | ORAL_TABLET | Freq: Every day | ORAL | 0 refills | Status: DC

## 2018-01-17 MED ORDER — AZITHROMYCIN 600 MG PO TABS: 1200.0000 mg | ORAL_TABLET | ORAL | Status: DC

## 2018-01-17 MED ORDER — AMOXICILLIN-POT CLAVULANATE 500-125 MG PO TABS: 1.0000 | ORAL_TABLET | Freq: Every day | ORAL | Status: DC

## 2018-01-17 MED ORDER — IOPAMIDOL (ISOVUE-300) INJECTION 61%
INTRAVENOUS | Status: AC
  Filled 2018-01-17: qty 100

## 2018-01-17 MED ORDER — AZITHROMYCIN 600 MG PO TABS: 1200.0000 mg | ORAL_TABLET | ORAL | 0 refills | Status: AC

## 2018-01-17 NOTE — Progress Notes (Signed)
PHARMACY NOTE:  ANTIMICROBIAL RENAL DOSAGE ADJUSTMENT  Current antimicrobial regimen includes a mismatch between antimicrobial dosage and estimated renal function.  As per policy approved by the Pharmacy & Therapeutics and Medical Executive Committees, the antimicrobial dosage will be adjusted accordingly.  Current antimicrobial dosage:  Augmentin 1g PO TID  Indication: intra-abdominal infection s/p lap chole  Renal Function:  Estimated Creatinine Clearance: 13.1 mL/min (A) (by C-G formula based on SCr of 10.26 mg/dL (H)). [x]      On HD, scheduled TTSat []      On CRRT    Antimicrobial dosage has been changed to:   Augmentin 500/125mg  PO qhs x5 more days for ESRD dosing   Thank you for allowing pharmacy to be a part of this patient's care. Rocio Wolak D. Nayson Traweek, PharmD, BCPS Clinical Pharmacist Clinical Phone for 01/17/2018 until 3:30pm: [phone] If after 3:30pm, please call main pharmacy at [phone] 01/17/2018 2:06 PM

## 2018-01-17 NOTE — Discharge Instructions (Signed)

## 2018-01-17 NOTE — Progress Notes (Signed)
[facility] Progress Note   Subjective:  S/p lap chole  Eating breakfast, No abd c/os. Wants to go home  On schedule for IR fistulogram today   Objective Vitals:   01/16/18 1719 01/16/18 2140 01/17/18 0525 01/17/18 0528  BP: 116/84 (!) 123/95 (!) 147/116 (!) 147/119  Pulse: 83 80 81 83  Resp: 16 16 16    Temp: 98.8 F (37.1 C) 98.7 F (37.1 C) 99 F (37.2 C)   TempSrc: Oral Oral Oral   SpO2: 99% 98% 99% 99%  Weight:      Height:       Physical Exam General: WNWD male NAD Heart: RRR Lungs: CTAB Abdomen: soft NT, surgical incisions intact  Extremities: No LE edema  Dialysis Access: RUE AVF+bruit   Dialysis Orders:  [facility] 4.5h 200NRe 550/800 EDW 115kg 2K/2.5Ca  R AVF No heparin  Mircera IV q 2 weeks ( dosed 4/4) Calcitriol 0.8mcg PO TIW   Assessment/Plan: 1. Acute cholecystitis - s/p laparoscopic cholecystectomy 4/25 Dr.Connor  2.  ESRD -  TTS. Orders written for HD tomorrow if still here  3. Vascular access - Was unable to receive his last 2 outpatient treatments due to hematoma over AVF, appears to have improved and have been cannulating during admission, but decreased BFR and ^VP noted. For fistulogram today   4.  Hypertension/volume  - BP controlled Improved with meds/HD. Now below his EDW post HD wt 110.1kg  5.  Anemia  - Hgb 10.3 Resume ESA. Aranesp 100 on 4/25  6.  Metabolic bone disease -  Continue Calcitriol/Renvela binder when diet resumes  7.  HIV - ART per primary/ID    Tomasa Blase PA-C [facility] Pager [phone] 01/17/2018,10:02 AM  LOS: 4 days   Additional Objective Labs: Basic Metabolic Panel: Recent Labs  Lab 01/14/18 0408 01/15/18 0310 01/16/18 0507 01/17/18 0542  NA 136 136 131* 133*  K 4.4 3.8 4.2 4.5  CL 103 99* 97* 97*  CO2 17* 26 21* 24  GLUCOSE 76 94 95 164*  BUN 91* 57* 64* 35*  CREATININE 16.32* 12.84* 15.24* 10.26*  CALCIUM 8.1* 8.1* 8.0* 8.3*  PHOS 6.9* 4.8*  --   --     CBC: Recent Labs  Lab 01/13/18 1552  01/14/18 0408 01/15/18 0310 01/16/18 0507 01/17/18 0542  WBC 5.5  --  7.3 6.5 8.7 15.7*  NEUTROABS 2.8  --   --   --   --   --   HGB 9.7*   < > 9.9* 9.4* 9.3* 10.3*  HCT 28.9*   < > 29.2* 28.5* 28.0* 31.2*  MCV 92.9  --  93.3 92.5 93.0 94.5  PLT 183  --  163 141* 127* 134*   < > = values in this interval not displayed.   Blood Culture    Component Value Date/Time   SDES URINE, RANDOM 04/28/2017 1447   SPECREQUEST NONE 04/28/2017 1447   CULT <10,000 COLONIES/mL INSIGNIFICANT GROWTH (A) 04/28/2017 1447   REPTSTATUS 04/29/2017 FINAL 04/28/2017 1447    Cardiac Enzymes: Recent Labs  Lab 01/13/18 1552 01/13/18 2355 01/14/18 0408  TROPONINI 0.35* 0.28* 0.29*   CBG: Recent Labs  Lab 01/16/18 1533  GLUCAP 90   Iron Studies: No results for input(s): IRON, TIBC, TRANSFERRIN, FERRITIN in the last 72 hours. Lab Results  Component Value Date   INR 1.31 01/13/2018   INR 1.16 04/28/2017   Medications: . sodium chloride 10 mL/hr at 01/16/18 1332  . piperacillin-tazobactam (ZOSYN)  IV Stopped (01/17/18 1024)   . calcitRIOL  0.5 mcg Oral Q T,Th,Sa-HD  . darbepoetin (ARANESP) injection - DIALYSIS  100 mcg Intravenous Q Thu-HD  . Darunavir-Cobicisctat-Emtricitabine-Tenofovir Alafenamide  1 tablet Oral Q breakfast  . dolutegravir  50 mg Oral Q breakfast   And  . rilpivirine  25 mg Oral Q breakfast  . metoprolol succinate  25 mg Oral Daily  . multivitamin  1 tablet Oral QHS  . sevelamer carbonate  1,600 mg Oral TID WC  . sulfamethoxazole-trimethoprim  1 tablet Oral QHS

## 2018-01-17 NOTE — Discharge Summary (Signed)
Name: James Schwartz MRN: [id number] DOB: August 15, 1981 37 y.o. PCP: Patient, No Pcp Per  Date of Admission: 01/13/2018  1:28 PM Date of Discharge: 01/17/2018 Attending Physician: Oda Kilts, MD  Discharge Diagnosis:  Principal Problem:   Acute cholecystitis due to biliary calculus Active Problems:   AIDS ([place])   ESRD (end stage renal disease) [facility])   Dialysis patient [facility])   Acute cholecystitis   Demand ischemia of myocardium [facility])   Discharge Medications: Allergies as of 01/17/2018      Reactions   Tomato Other (See Comments)   Mouth sores      Medication List    STOP taking these medications   metroNIDAZOLE 500 MG tablet Commonly known as:  FLAGYL   oseltamivir 30 MG capsule Commonly known as:  TAMIFLU     TAKE these medications   amoxicillin-clavulanate 500-125 MG tablet Commonly known as:  AUGMENTIN Take 1 tablet (500 mg total) by mouth at bedtime for 5 days.   azithromycin 600 MG tablet Commonly known as:  ZITHROMAX Take 2 tablets (1,200 mg total) by mouth once a week. Start taking on:  01/18/2018   ciprofloxacin 500 MG tablet Commonly known as:  CIPRO Take 1 tablet (500 mg total) by mouth daily with breakfast.   Darbepoetin Alfa 100 MCG/0.5ML Sosy injection Commonly known as:  ARANESP Inject 0.5 mLs (100 mcg total) into the vein every Wednesday with hemodialysis.   Darunavir-Cobicisctat-Emtricitabine-Tenofovir Alafenamide 800-150-200-10 MG Tabs Commonly known as:  SYMTUZA Take 1 tablet by mouth daily with breakfast.   Dolutegravir-Rilpivirine 50-25 MG Tabs Commonly known as:  JULUCA Take 1 tablet by mouth daily with supper. What changed:  when to take this   gabapentin 100 MG capsule Commonly known as:  NEURONTIN TAKE 1 CAPSULE (100 MG TOTAL) BY MOUTH TWO (TWO) TIMES DAILY.   HYDROcodone-acetaminophen 5-325 MG tablet Commonly known as:  NORCO/VICODIN Take 1 tablet by mouth every 4 (four) hours as needed.   metoprolol succinate 50 MG  24 hr tablet Commonly known as:  TOPROL XL Take 1 tablet (50 mg total) by mouth daily. Take with or immediately following a meal.   metoprolol succinate 25 MG 24 hr tablet Commonly known as:  TOPROL XL Take 1 tablet (25 mg total) by mouth daily. TAKE WITH 50 MG tablet.   multivitamin Tabs tablet Take 1 tablet by mouth at bedtime. What changed:  when to take this   sulfamethoxazole-trimethoprim 400-80 MG tablet Commonly known as:  BACTRIM,SEPTRA Take 1 tablet by mouth daily. What changed:  when to take this       Disposition and follow-up:   Mr.James Schwartz was discharged from [facility] in Stable condition.  At the hospital follow up visit please address:  Acute Cholecystitis: - Ensure follow up with surgery - Ensure patient able to obtain Augmentin - Monitor for recurrence of symptoms  HIV/AIDS:  - Ensure follow up with ID - Ensure medication adherence - Ensure patient taking prophylactic bactrim and Azithromycin.  ESRD on HDTThS:  - Ensure follow up with Nephrology - Nephrology will be making surgery referral for competing outflow tract ligation - Ensure able to have successful Dialysis  Chest Pain: - Monitor for recurrent symptoms - Consider outpatient stress test  2.  Labs / imaging needed at time of follow-up: None  3.  Pending labs/ test needing follow-up: None  Follow-up Appointments: Follow-up Information    [place] Callas, NP Follow up on 02/20/2018.   Specialty:  Infectious Diseases Why:  @  11:00 Contact information: [place] [address] [phone]        [facility], [place]. Go on 02/04/2018.   Specialty:  General Surgery Why:  Your appointment is scheduled for 9:45 AM. Please arrive 30 min prior to appointment time. Bring photo ID and insurance information.  Contact information: [address] [place] [facility] Course  by problem list:  Acute Cholecystitis: Presented with 6 days of abdominal pain and ED visit 2 days prior for the same. RUQ [place] consistent with acute cholecystitis on 4/20, but patient declined admission. Repeat RUQ [place] 4/22 re-demonstrated gall bladder wall thickening with new2.4 cm gallstone lodged in the gallbladder neck,consistent with acute cholecystitis.Patient also had elevated transaminases. Started on Zosyn and observed while obtaining CD4 count and clearance from infectious disease as patient had history of HIV and medication nonadherance and surgery was concerned for increased risk of infection. Clearance was given and patient underwent successful Laparoscopic Cholecystectomy on 4/26. Patinet felt much better following surgery; he remained afebrile, with leukocytosis (suspected to be reactive from the surgery). Patient was discharged on 5 day course of Augmentin.  HIV/AIDS: Diagnosed in 2002.History of medication non-adherence. Currently on Symtuza (Darunavir-Cobicisctat-Emtricitabine-Tenofovir Alafenamide), Juluca (Dolutegravir-Rilpivirine), and Bactrim. Patient seen by ID, repeat CD4 30 and viral load 31,000.  Patient started on Azithromycin for MAC prophylaxis. Discharged on previous regimen plus Azithromycin.  ESRD on HDTThS: Receiving Inpatient HD.On HD since 04/2017. K 4.9on admission.CXR with mild pulmonary edema, appears largely euvolemic on examination. Patient presented with hyper kalemia and recent Issues with cannulating fistula, had had two unsuccessful sessions prior to admission. Patient was able to be dialyzed inpatient, though there were some difficulties noted. Fistulogram showed competing outflow veins that would require ligation in the future. Nephrology will follow up and arrange ligation.  Chest Pain: Patient presented with chest pain with atypical and typical features. Pain was relieved by nitro. Risk factors included smoking history, HIV, and obesity. With patients  age and history, suspicion for ACS was lower, but initial troponin iwas elevated and EKG showed T-wave inversion in V5 and V5. Chest pain resolved and Troponin plateaud: 035>>0.28>>0.29. Demand ischemia suspected.  Discharge Vitals:   BP (!) 140/112 (BP Location: Left Arm)   Pulse 78   Temp 98.2 F (36.8 C) (Oral)   Resp 16   Ht 6' 1"  (1.854 m)   Wt 247 lb 12.8 oz (112.4 kg)   SpO2 100%   BMI 32.69 kg/m   Pertinent Labs, Studies, and Procedures:  CBC Latest Ref Rng & Units 01/17/2018 01/16/2018  WBC 4.0 - 10.5 K/uL 15.7(H) 8.7  Hemoglobin 13.0 - 17.0 g/dL 10.3(L) 9.3(L)  Hematocrit 39.0 - 52.0 % 31.2(L) 28.0(L)  Platelets 150 - 400 K/uL 134(L) 127(L)   BMP Latest Ref Rng & Units 01/17/2018 01/16/2018  Glucose 65 - 99 mg/dL 164(H) 95  BUN 6 - 20 mg/dL 35(H) 64(H)  Creatinine 0.61 - 1.24 mg/dL 10.26(H) 15.24(H)  Sodium 135 - 145 mmol/L 133(L) 131(L)  Potassium 3.5 - 5.1 mmol/L 4.5 4.2  Chloride 101 - 111 mmol/L 97(L) 97(L)  CO2 22 - 32 mmol/L 24 21(L)  Calcium 8.9 - 10.3 mg/dL 8.3(L) 8.0(L)   Admission EKG:  EKG Interpretation  Date/Time:  Monday January 13 2018 13:53:33 EDT Ventricular Rate:  96 PR Interval:    QRS Duration: 86 QT Interval:  387 QTC Calculation: 490 R Axis:   55  Text Interpretation:  Sinus rhythm Probable left atrial enlargement Nonspecific T abnormalities, lateral leads Borderline prolonged QT interval Since last EKG, TWI now noted in V6 Otherwise no significant change Confirmed by Duffy Bruce [phone]) on 01/13/2018 3:11:04 PM      CXR (4/22): IMPRESSION: Cardiac enlargement and possible mild pulmonary interstitial edema.  US Abdomen RUQ (4/22): IMPRESSION: - 2.4 cm gallstone lodged in the gallbladder neck. Marked gallbladder wall thickening measuring up to 1.4 cm (as previously noted). Patient was tender over the gallbladder during scanning. Findings consistent with acute cholecystitis. - Small right-sided pleural effusion. - Right renal parenchyma  increased echogenicity consistent with patient's history of renal failure.  Cholangiogram (Operative, 4/25): IMPRESSION: Filling defects in the central hepatic ducts may simply represent air bubbles. Correlate clinically as for the need for laboratory analysis or MRCP.  Fistulogram (4/26): IMPRESSION: 1. No significant outflow stenosis or occlusion. 2. 2 large competing outflow veins several cm central to the AV fistula. Consider surgical ligation.  Op Note: Description of procedure: After obtaining informed consent the patient was brought to the operating room. Patient is receiving antibiotics. SCD's were applied. General endotracheal anesthesia was initiated and a formal time-out was performed. The abdomen was prepped and draped in the usual sterile fashion and the abdomen was entered using an infraumbilical veress needle after instilling the site with local. Insufflation to 50mHg was obtained and gross inspection revealed no evidence of injury from our entry or other intraabdominal abnormalities. Two 541mtrocars were introduced in the right midclavicular and right anterior axillary lines under direct visualization and following infiltration with local. An 1148mrocar was placed in the epigastrium. The gallbladder was retracted cephalad and the infundibulum was retracted laterally. A combination of hook electrocautery and blunt dissection was utilized to clear the peritoneum from the neck and cystic duct, circumferentially isolating the cystic artery and cystic duct and lifting the gallbladder from the cystic plate. The critical view of safety was achieved with the cystic artery, cystic duct, and liver bed visualized between them with no other structures. A clip was placed at the junction of the cystic duct and gallbladder neck and a ductotomy was made sharply. The [facility] was introduced through a stab wound in the abdominal wall and directed down into the cystic duct. This was secured with a clip.  Cholangiogram was performed and demonstrated normal biliary anatomy without any obstructive lesions, contrast flowed freely into the duodenum. The catheter was removed. 3 clips were placed on the proximal cystic duct and the division was completed. The artery was clipped with two clips proximally and one distally and divided. The gallbladder was dissected from the liver plate using electrocautery. Once freed the gallbladder was placed in an endocatch bag and removed intact through the epigastric trocar site. A small amount of bleeding on the liver bed was controlled with cautery. Hemostasis was once again confirmed, and reinspection of the abdomen revealed no injuries. The clips were well opposed without any bile leak from the duct or the liver bed. The 70m97mocar site in the epigastrium was closed with a 0 vicryl in the fascia under direct visualization using a PMI device. The abdomen was desufflated and all trocars removed. The skin incisions were closed with running subcuticular monocryl and Dermabond. The patient was awakened, extubated and transported to the recovery room in stable condition. All counts were correct at the completion of the case.  Discharge Instructions: Discharge Instructions    Call MD for:  difficulty breathing, headache or  visual disturbances   Complete by:  As directed    Call MD for:  persistant dizziness or light-headedness   Complete by:  As directed    Call MD for:  persistant nausea and vomiting   Complete by:  As directed    Call MD for:  redness, tenderness, or signs of infection (pain, swelling, redness, odor or green/yellow discharge around incision site)   Complete by:  As directed    Call MD for:  severe uncontrolled pain   Complete by:  As directed    Call MD for:  temperature >100.4   Complete by:  As directed    Diet - low sodium heart healthy   Complete by:  As directed    Discharge instructions   Complete by:  As directed    Thank you for allowing [place] to  care for you  Your symptoms were due to inflammation of your gall bladder. - You had your gallbladder removed by the surgery team - Please take Augmentin 544m at bedtime for 5 days  For your HIV - Please take your antiviral medication everyday - Please take your Bactrim everyday to prevent infections - Please take Azithromycin 12026monce a week on Saturdays (starting tomorrow) to prevent infections  Please follow up with infectious disease in their clinic  Please follow up with surgery as scheduled by them   Increase activity slowly   Complete by:  As directed       Signed: MeNeva SeatMD 01/17/2018, 4:22 PM   Pager: [phone]

## 2018-01-17 NOTE — Progress Notes (Signed)
Referring Physician(s): Dr Sherlon Handing  Supervising Physician: Oley Balm  Patient Status:  [facility] - In-pt  Chief Complaint:  Rt upper arm dialysis fistula High venous pressures  Subjective:  Right upper arm fistula- Slow flow High venous pressures Has been using this access since Nov 2018 Request for fistulogram with possible angioplasty/stent placement  Admitted with abd pain; fever; leukocytosis: Yesterday Lap chole  Allergies: Tomato  Medications: Prior to Admission medications   Medication Sig Start Date End Date Taking? Authorizing Provider  ciprofloxacin (CIPRO) 500 MG tablet Take 1 tablet (500 mg total) by mouth daily with breakfast. 01/11/18  Yes Mackuen, Courteney Lyn, MD  Darbepoetin Alfa (ARANESP) 100 MCG/0.5ML SOSY injection Inject 0.5 mLs (100 mcg total) into the vein every Wednesday with hemodialysis. 05/08/17  Yes Gwynn Burly, DO  Darunavir-Cobicisctat-Emtricitabine-Tenofovir Alafenamide [facility]) 800-150-200-10 MG TABS Take 1 tablet by mouth daily with breakfast. 10/07/17  Yes Dixon, Gomez Cleverly, NP  Dolutegravir-Rilpivirine (JULUCA) 50-25 MG TABS Take 1 tablet by mouth daily with supper. Patient taking differently: Take 1 tablet by mouth daily with breakfast.  10/07/17  Yes Dixon, Gomez Cleverly, NP  gabapentin (NEURONTIN) 100 MG capsule TAKE 1 CAPSULE (100 MG TOTAL) BY MOUTH TWO (TWO) TIMES DAILY. 10/14/17  Yes Blanchard Kelch, NP  HYDROcodone-acetaminophen (NORCO/VICODIN) 5-325 MG tablet Take 1 tablet by mouth every 4 (four) hours as needed. 12/03/17  Yes Jacalyn Lefevre, MD  metoprolol succinate (TOPROL XL) 25 MG 24 hr tablet Take 1 tablet (25 mg total) by mouth daily. TAKE WITH 50 MG tablet. 01/02/18  Yes Blanchard Kelch, NP  metoprolol succinate (TOPROL XL) 50 MG 24 hr tablet Take 1 tablet (50 mg total) by mouth daily. Take with or immediately following a meal. 05/17/17 05/17/18 Yes Chundi, Vahini, MD  multivitamin (RENA-VIT) TABS tablet Take 1 tablet  by mouth at bedtime. Patient taking differently: Take 1 tablet by mouth daily with breakfast.  05/06/17  Yes Gwynn Burly, DO  oseltamivir (TAMIFLU) 30 MG capsule Take 1 capsule (30 mg total) by mouth daily. On dialysis days, take after dialysis 12/04/17  Yes Jacalyn Lefevre, MD  sulfamethoxazole-trimethoprim (BACTRIM,SEPTRA) 400-80 MG tablet Take 1 tablet by mouth daily. Patient taking differently: Take 1 tablet by mouth daily with breakfast.  10/07/17  Yes Blanchard Kelch, NP     Vital Signs: BP (!) 147/119   Pulse 83   Temp 99 F (37.2 C) (Oral)   Resp 16   Ht 6\' 1"  (1.854 m)   Wt 247 lb 12.8 oz (112.4 kg)   SpO2 99%   BMI 32.69 kg/m   Physical Exam  Constitutional: He is oriented to person, place, and time.  Cardiovascular: Normal rate and regular rhythm.  Pulmonary/Chest: Effort normal and breath sounds normal.  Abdominal: Soft. Bowel sounds are normal.  Musculoskeletal: Normal range of motion.  Right upper arm fistula with + pulse and + thrill  Neurological: He is alert and oriented to person, place, and time.  Skin: Skin is warm and dry.  Psychiatric: He has a normal mood and affect. His behavior is normal.  Nursing note and vitals reviewed.   Imaging: Dg Chest 2 View  Result Date: 01/13/2018 CLINICAL DATA:  Left-sided chest pain. EXAM: CHEST - 2 VIEW COMPARISON:  01/11/2018 FINDINGS: Stable moderate cardiac enlargement. Mild amount of fissural thickening again noted in the lateral projection, likely reflecting a small amount of pleural fluid in the fissures. There may be mild pulmonary interstitial edema bilaterally. There is no evidence of  airspace edema, consolidation, pneumothorax or nodule. IMPRESSION: Cardiac enlargement and possible mild pulmonary interstitial edema. Electronically Signed   By: Irish LackGlenn  Yamagata M.D.   On: 01/13/2018 16:20   Dg Cholangiogram Operative  Result Date: 01/16/2018 CLINICAL DATA:  Gallstones EXAM: INTRAOPERATIVE CHOLANGIOGRAM  TECHNIQUE: Cholangiographic images from the C-arm fluoroscopic device were submitted for interpretation post-operatively. Please see the procedural report for the amount of contrast and the fluoroscopy time utilized. COMPARISON:  None. FINDINGS: Contrast fills the biliary tree and duodenum. There are round filling defects in the bilateral central hepatic ducts. IMPRESSION: Filling defects in the central hepatic ducts may simply represent air bubbles. Correlate clinically as for the need for laboratory analysis or MRCP. Electronically Signed   By: Jolaine ClickArthur  Hoss M.D.   On: 01/16/2018 15:25   [place] Abdomen Limited Ruq  Result Date: 01/13/2018 CLINICAL DATA:  37 year old male with right upper quadrant pain for the past week. Subsequent encounter. EXAM: ULTRASOUND ABDOMEN LIMITED RIGHT UPPER QUADRANT COMPARISON:  01/11/2018 ultrasound. FINDINGS: Gallbladder: 2.4 cm gallstone lodged in the gallbladder neck. Marked gallbladder wall thickening measuring up to 1.4 cm. Patient was tender over the gallbladder during scanning. Common bile duct: Diameter: 3.3 mm. Liver: Heterogeneous without focal mass. Portal vein is patent on color Doppler imaging with normal direction of blood flow towards the liver. Small right pleural effusion. Right renal parenchyma increased echogenicity consistent with patient's history of renal failure. IMPRESSION: 2.4 cm gallstone lodged in the gallbladder neck. Marked gallbladder wall thickening measuring up to 1.4 cm (as previously noted). Patient was tender over the gallbladder during scanning. Findings consistent with acute cholecystitis. Small right-sided pleural effusion. Right renal parenchyma increased echogenicity consistent with patient's history of renal failure. These results were called by telephone at the time of interpretation on 01/13/2018 at 5:43 pm to Dr. Shaune PollackAMERON ISAACS , who verbally acknowledged these results. Electronically Signed   By: Lacy DuverneySteven  Olson M.D.   On: 01/13/2018 17:46     Labs:  CBC: Recent Labs    01/14/18 0408 01/15/18 0310 01/16/18 0507 01/17/18 0542  WBC 7.3 6.5 8.7 15.7*  HGB 9.9* 9.4* 9.3* 10.3*  HCT 29.2* 28.5* 28.0* 31.2*  PLT 163 141* 127* 134*    COAGS: Recent Labs    04/28/17 0949 01/13/18 1552  INR 1.16 1.31    BMP: Recent Labs    01/14/18 0408 01/15/18 0310 01/16/18 0507 01/17/18 0542  NA 136 136 131* 133*  K 4.4 3.8 4.2 4.5  CL 103 99* 97* 97*  CO2 17* 26 21* 24  GLUCOSE 76 94 95 164*  BUN 91* 57* 64* 35*  CALCIUM 8.1* 8.1* 8.0* 8.3*  CREATININE 16.32* 12.84* 15.24* 10.26*  GFRNONAA 3* 4* 4* 6*  GFRAA 4* 5* 4* 7*    LIVER FUNCTION TESTS: Recent Labs    01/13/18 1552 01/14/18 0408 01/15/18 0310 01/16/18 0507 01/17/18 0542  BILITOT 0.7  --  0.9 0.8 0.7  AST 185*  --  182* 100* 62*  ALT 191*  --  239* 190* 153*  ALKPHOS 96  --  75 64 64  PROT 7.5  --  7.0 7.0 7.7  ALBUMIN 2.9* 2.8* 2.6*  2.6* 2.4* 2.6*    Assessment and Plan:  Right upper arm fistula with high venous pressure Scheduled for fistulogram with possible angioplasty/stent placement Pt is aware of procedure benefits and risks including but not limited to Infection; bleeding; damage to fistula Agreeable to proceed Cosnent signed andin chart   Electronically Signed: Danitra Payano A, PA-C 01/17/2018,  10:02 AM   I spent a total of 25 Minutes at the the patient's bedside AND on the patient's hospital floor or unit, greater than 50% of which was counseling/coordinating care for Rt arm fistulogram

## 2018-01-17 NOTE — Progress Notes (Signed)
   Subjective: James Schwartz was seen resting comfortably in his bed this morning. He underwent a successful laparoscopic cholecystectomy yesterday. He states he is feeling well and wants to eat and go home today. He states he has not had to use any pain control medication since yesterday evening and his pain remains controlled. He is scheduled to undergo a fistulogram today and possible stenting if needed. He has no other questions or concerns this morning.  Objective:  Vital signs in last 24 hours: Vitals:   01/16/18 1719 01/16/18 2140 01/17/18 0525 01/17/18 0528  BP: 116/84 (!) 123/95 (!) 147/116 (!) 147/119  Pulse: 83 80 81 83  Resp: _0 Temp: 98.8 F (37.1 C) 98.7 F (37.1 C) 99 F (37.2 C)   TempSrc: Oral Oral Oral   SpO2: 99% 98% 99% 99%  Weight:      Height:       Physical Exam  Constitutional: He is oriented to person, place, and time. He appears well-developed and well-nourished. No distress.  Cardiovascular: Normal rate, regular rhythm, normal heart sounds and intact distal pulses.  Pulmonary/Chest: Effort normal and breath sounds normal. No respiratory distress.  Abdominal: Soft. Bowel sounds are normal. He exhibits no distension.  Mildly tender at upper abdomen  Musculoskeletal: He exhibits no edema or deformity.  Neurological: He is alert and oriented to person, place, and time.  Skin: Skin is warm and dry.   Assessment/Plan:  Acute Cholecystitis: S/P Lap Chole 4/26. Presented w/ 6d of abd pain and ED visit 2 days prior for worsening abdominal pain. RUQ [place] consistent with acute cholecystitis on 4/20, but patient declined admission. Repeat RUQ [place] 4/22 re-demonstrated gall bladder wall thickening with new 2.4 cm gallstone lodged in the gallbladder neck, consistent with acute cholecystitis. Patient also has elevated transaminases. Started on Zosyn on admission. > Afebrile since surgery, mild leukocytosis (likely reactive from surgery) - General surgery consulted,  appreciate their recommendations - Successful Lap Chole 4/25 > Surg Path: Gallbladder > CHRONIC CHOLECYSTITIS AND CHOLELITHIASIS. - Transition Abx to Augmentin 1g TID  HIV/AIDS: Diagnosed in 2002. History of medication non-adherence. Currently on Symtuza (Darunavir-Cobicisctat-Emtricitabine-Tenofovir Alafenamide), Juluca (Dolutegravir-Rilpivirine), and Bactrim (for ppx). Last CD4 count 155, VL 6870. (Nov 2018) > CD4 30, VL 31,000 - Infectious disease consulted, appreciate their recommendation - Will continue home medications - Will start weekly Azithromycin for MAC prophylaxis  ESRD on HD TThS: Receiving Inpatient HD. On HD since 04/2017. K 4.9 on admission. CXR with mild pulmonary edema, appears largely euvolemic on examination.  > Recent Issues with cannulating fistula, had missed to session prior to admission - Nephrology consulted for HD, appreciate recommendations - Fistulogram today   Chest Pain: Resolved. Chest pain with atypical and typical features present on admission. Pain was relieved by nitro. Risk factors include smoking history, HIV, and obesity. With patients age and history, suspicion for ACS is lower, but troponin was elevated and EKG showed T-wave inversion in V5 and V5. > Remains Chest Pain free > Troponin trend platued: 035>>0.28>>0.29 - Will continue to monitor for chest pain here, will likely need stress test outpatient - Cardiac Monitoring  HTN: BP 492E Systolic Last 10OFH. Home regimen includes metoprolol succinate 5m Daily. Unclear if patient has been taking his BP medications at home.  - Metoprolol 756mDaily - Will continue to monitor  FEN: NPO VTE ppx: SCDs Code Status: FULL  Dispo: Anticipated discharge later today.   MeNeva SeatMD 01/17/2018, 11:54 AM Pager: [phone]

## 2018-01-17 NOTE — Progress Notes (Signed)
Pt given discharge instructions, prescriptions, and care notes. Pt verbalized understanding AEB no further questions or concerns at this time. IV was discontinued, no redness, pain, or swelling noted at this time. Telemetry discontinued and Centralized Telemetry was notified. Pt left the floor via wheelchair with staff in stable condition. 

## 2018-01-17 NOTE — Progress Notes (Signed)
1 Day Post-Op   Subjective/Chief Complaint: Denies abdominal pain, wants to eat   Objective: Vital signs in last 24 hours: Temp:  [97.3 F (36.3 C)-101.5 F (38.6 C)] 99 F (37.2 C) (04/26 0525) Pulse Rate:  [80-104] 83 (04/26 0528) Resp:  [5-18] 16 (04/26 0525) BP: (101-155)/(68-119) 147/119 (04/26 0528) SpO2:  [94 %-100 %] 99 % (04/26 0528) Weight:  [110.1 kg (242 lb 11.6 oz)-112.4 kg (247 lb 12.8 oz)] 112.4 kg (247 lb 12.8 oz) (04/25 1303) Last BM Date: 01/15/18  Intake/Output from previous day: 04/25 0701 - 04/26 0700 In: 1110 [P.O.:360; I.V.:700; IV Piggyback:50] Out: 2573  Intake/Output this shift: No intake/output data recorded.  General appearance: alert and cooperative Resp: Unlabored GI: soft, non-tender; bowel sounds normal; no masses,  no organomegaly and Incisions clean dry and intact with Dermabond, no signs of infection  Lab Results:  Recent Labs    01/16/18 0507 01/17/18 0542  WBC 8.7 15.7*  HGB 9.3* 10.3*  HCT 28.0* 31.2*  PLT 127* 134*   BMET Recent Labs    01/16/18 0507 01/17/18 0542  NA 131* 133*  K 4.2 4.5  CL 97* 97*  CO2 21* 24  GLUCOSE 95 164*  BUN 64* 35*  CREATININE 15.24* 10.26*  CALCIUM 8.0* 8.3*   PT/INR No results for input(s): LABPROT, INR in the last 72 hours. ABG No results for input(s): PHART, HCO3 in the last 72 hours.  Invalid input(s): PCO2, PO2  Studies/Results: Dg Cholangiogram Operative  Result Date: 01/16/2018 CLINICAL DATA:  Gallstones EXAM: INTRAOPERATIVE CHOLANGIOGRAM TECHNIQUE: Cholangiographic images from the C-arm fluoroscopic device were submitted for interpretation post-operatively. Please see the procedural report for the amount of contrast and the fluoroscopy time utilized. COMPARISON:  None. FINDINGS: Contrast fills the biliary tree and duodenum. There are round filling defects in the bilateral central hepatic ducts. IMPRESSION: Filling defects in the central hepatic ducts may simply represent air  bubbles. Correlate clinically as for the need for laboratory analysis or MRCP. Electronically Signed   By: Jolaine ClickArthur  Hoss M.D.   On: 01/16/2018 15:25    Anti-infectives: Anti-infectives (From admission, onward)   Start     Dose/Rate Route Frequency Ordered Stop   01/14/18 1700  Dolutegravir-Rilpivirine 50-25 MG TABS 1 tablet  Status:  Discontinued     1 tablet Oral Daily with supper 01/13/18 2050 01/13/18 2214   01/14/18 1130  sulfamethoxazole-trimethoprim (BACTRIM,SEPTRA) 400-80 MG per tablet 1 tablet     1 tablet Oral Daily at bedtime 01/14/18 1130     01/14/18 1000  sulfamethoxazole-trimethoprim (BACTRIM,SEPTRA) 400-80 MG per tablet 1 tablet  Status:  Discontinued     1 tablet Oral Daily 01/13/18 2050 01/14/18 1130   01/14/18 0800  Darunavir-Cobicisctat-Emtricitabine-Tenofovir Alafenamide (SYMTUZA) 800-150-200-10 MG TABS 1 tablet     1 tablet Oral Daily with breakfast 01/13/18 2050     01/14/18 0800  dolutegravir (TIVICAY) tablet 50 mg     50 mg Oral Daily with breakfast 01/13/18 2215     01/14/18 0800  rilpivirine (EDURANT) tablet 25 mg     25 mg Oral Daily with breakfast 01/13/18 2215     01/14/18 0600  piperacillin-tazobactam (ZOSYN) IVPB 3.375 g     3.375 g 12.5 mL/hr over 240 Minutes Intravenous Every 12 hours 01/14/18 0147     01/13/18 1730  piperacillin-tazobactam (ZOSYN) IVPB 3.375 g     3.375 g 100 mL/hr over 30 Minutes Intravenous  Once 01/13/18 1716 01/13/18 2044      Assessment/Plan: s/p Procedure(s):  LAPAROSCOPIC CHOLECYSTECTOMY WITH INTRAOPERATIVE CHOLANGIOGRAM (N/A) 4/25 Advance diet, mobilize, pulmonary toilet. From surgical standpoint, patient is cleared for discharge.  LOS: 4 days    James Schwartz 01/17/2018

## 2018-01-17 NOTE — Plan of Care (Signed)

## 2018-01-18 ENCOUNTER — Emergency Department (HOSPITAL_COMMUNITY)
Admission: EM | Admit: 2018-01-18 | Discharge: 2018-01-20 | Disposition: A | Discharge status: Home / Self Care | Payer: Medicaid Other | Attending: Emergency Medicine | Admitting: Emergency Medicine

## 2018-01-18 DIAGNOSIS — N186 End stage renal disease: Secondary | ICD-10-CM | POA: Diagnosis not present

## 2018-01-18 DIAGNOSIS — J45909 Unspecified asthma, uncomplicated: Secondary | ICD-10-CM | POA: Diagnosis not present

## 2018-01-18 DIAGNOSIS — F17210 Nicotine dependence, cigarettes, uncomplicated: Secondary | ICD-10-CM | POA: Diagnosis not present

## 2018-01-18 DIAGNOSIS — Z9114 Patient's other noncompliance with medication regimen: Secondary | ICD-10-CM | POA: Diagnosis not present

## 2018-01-18 DIAGNOSIS — B20 Human immunodeficiency virus [HIV] disease: Secondary | ICD-10-CM | POA: Insufficient documentation

## 2018-01-18 DIAGNOSIS — G8918 Other acute postprocedural pain: Secondary | ICD-10-CM | POA: Diagnosis not present

## 2018-01-18 DIAGNOSIS — Z79899 Other long term (current) drug therapy: Secondary | ICD-10-CM | POA: Insufficient documentation

## 2018-01-18 DIAGNOSIS — D631 Anemia in chronic kidney disease: Secondary | ICD-10-CM | POA: Diagnosis not present

## 2018-01-18 DIAGNOSIS — Z992 Dependence on renal dialysis: Secondary | ICD-10-CM | POA: Diagnosis not present

## 2018-01-18 DIAGNOSIS — R1011 Right upper quadrant pain: Secondary | ICD-10-CM | POA: Diagnosis present

## 2018-01-18 DIAGNOSIS — N189 Chronic kidney disease, unspecified: Secondary | ICD-10-CM

## 2018-01-18 DIAGNOSIS — I120 Hypertensive chronic kidney disease with stage 5 chronic kidney disease or end stage renal disease: Secondary | ICD-10-CM | POA: Insufficient documentation

## 2018-01-18 LAB — COMPREHENSIVE METABOLIC PANEL
ALBUMIN: 2.6 g/dL — AB (ref 3.5–5.0)
ALT: 88 U/L — ABNORMAL HIGH (ref 17–63)
AST: 42 U/L — ABNORMAL HIGH (ref 15–41)
Alkaline Phosphatase: 58 U/L (ref 38–126)
Anion gap: 13 (ref 5–15)
BILIRUBIN TOTAL: 0.7 mg/dL (ref 0.3–1.2)
BUN: 57 mg/dL — AB (ref 6–20)
CHLORIDE: 96 mmol/L — AB (ref 101–111)
CO2: 24 mmol/L (ref 22–32)
Calcium: 7.9 mg/dL — ABNORMAL LOW (ref 8.9–10.3)
Creatinine, Ser: 12.92 mg/dL — ABNORMAL HIGH (ref 0.61–1.24)
GFR calc Af Amer: 5 mL/min — ABNORMAL LOW (ref >60)
GFR calc non Af Amer: 4 mL/min — ABNORMAL LOW (ref >60)
GLUCOSE: 114 mg/dL — AB (ref 65–99)
POTASSIUM: 3.9 mmol/L (ref 3.5–5.1)
Sodium: 133 mmol/L — ABNORMAL LOW (ref 135–145)
TOTAL PROTEIN: 7.2 g/dL (ref 6.5–8.1)

## 2018-01-18 LAB — CBC
HEMATOCRIT: 32.6 % — AB (ref 39.0–52.0)
Hemoglobin: 10.7 g/dL — ABNORMAL LOW (ref 13.0–17.0)
MCH: 31.3 pg (ref 26.0–34.0)
MCHC: 32.8 g/dL (ref 30.0–36.0)
MCV: 95.3 fL (ref 78.0–100.0)
Platelets: 136 10*3/uL — ABNORMAL LOW (ref 150–400)
RBC: 3.42 MIL/uL — ABNORMAL LOW (ref 4.22–5.81)
RDW: 17.6 % — AB (ref 11.5–15.5)
WBC: 18.6 10*3/uL — ABNORMAL HIGH (ref 4.0–10.5)

## 2018-01-18 LAB — LIPASE, BLOOD: Lipase: 42 U/L (ref 11–51)

## 2018-01-18 MED ORDER — MORPHINE SULFATE (PF) 4 MG/ML IV SOLN
4.0000 mg | Freq: Once | INTRAVENOUS | Status: AC
  Administered 2018-01-19: 4 mg via INTRAVENOUS
  Filled 2018-01-18: qty 1

## 2018-01-18 MED ORDER — CIPROFLOXACIN IN D5W 400 MG/200ML IV SOLN
400.0000 mg | Freq: Once | INTRAVENOUS | Status: AC
  Administered 2018-01-19: 400 mg via INTRAVENOUS
  Filled 2018-01-18: qty 200

## 2018-01-18 MED ORDER — PIPERACILLIN-TAZOBACTAM IN DEX 2-0.25 GM/50ML IV SOLN
2.2500 g | Freq: Once | INTRAVENOUS | Status: AC
  Administered 2018-01-19: 2.25 g via INTRAVENOUS
  Filled 2018-01-18: qty 50

## 2018-01-18 NOTE — ED Provider Notes (Signed)
[facility] EMERGENCY DEPARTMENT Provider Note   CSN: [id number] Arrival date & time: 01/18/18  2137     History   Chief Complaint Chief Complaint  Patient presents with  . Abdominal Pain    HPI James Schwartz is a 37 y.o. male.  The history is provided by the patient.  He has history of HIV infection, hypertension, end-stage renal disease on dialysis and is 2 days status post laparoscopic cholecystectomy and was discharged from the hospital yesterday.  He did not get his prescriptions filled on discharge.  He missed his dialysis session today.  He is complaining of ongoing pain which is mainly on the right side of his body extending from his abdomen up to the chest and into the neck.  He denies fever or chills.  Past Medical History:  Diagnosis Date  . Asthma   . ESRD (end stage renal disease) (HCC)   . HIV (human immunodeficiency virus infection) (HCC)   . Hypertension 04/28/2017    Patient Active Problem List   Diagnosis Date Noted  . Demand ischemia of myocardium (HCC) 01/14/2018  . Acute cholecystitis 01/13/2018  . Acute cholecystitis due to biliary calculus 01/13/2018  . Polyneuropathy associated with underlying disease (HCC) 06/21/2017  . Dialysis patient (HCC)   . AIDS (HCC)   . ESRD (end stage renal disease) (HCC)   . Hypertension 04/28/2017  . Asthma 04/28/2017  . Anemia 04/28/2017    Past Surgical History:  Procedure Laterality Date  . AV FISTULA PLACEMENT Right 05/06/2017   Procedure: ARTERIOVENOUS (AV) FISTULA CREATION RIGHT ARM;  Surgeon: Fransisco Hertz, MD;  Location: [facility] OR;  Service: Vascular;  Laterality: Right;  . CHOLECYSTECTOMY N/A 01/16/2018   Procedure: LAPAROSCOPIC CHOLECYSTECTOMY WITH INTRAOPERATIVE CHOLANGIOGRAM;  Surgeon: Berna Bue, MD;  Location: [facility] OR;  Service: General;  Laterality: N/A;  . EXCHANGE OF A DIALYSIS CATHETER Right 05/06/2017   Procedure: EXCHANGE OF A DIALYSIS CATHETER RIGHT;  Surgeon: Fransisco Hertz,  MD;  Location: [facility] OR;  Service: Vascular;  Laterality: Right;  . IR DIALY SHUNT INTRO NEEDLE/INTRACATH INITIAL W/IMG RIGHT Right 01/17/2018  . IR FLUORO GUIDE CV LINE RIGHT  04/29/2017  . IR US GUIDE VASC ACCESS RIGHT  04/29/2017        Home Medications    Prior to Admission medications   Medication Sig Start Date End Date Taking? Authorizing Provider  amoxicillin-clavulanate (AUGMENTIN) 500-125 MG tablet Take 1 tablet (500 mg total) by mouth at bedtime for 5 days. 01/17/18 01/22/18  Beola Cord, MD  azithromycin (ZITHROMAX) 600 MG tablet Take 2 tablets (1,200 mg total) by mouth once a week. 01/18/18   Beola Cord, MD  ciprofloxacin (CIPRO) 500 MG tablet Take 1 tablet (500 mg total) by mouth daily with breakfast. 01/11/18   Mackuen, Cindee Salt, MD  Darbepoetin Alfa (ARANESP) 100 MCG/0.5ML SOSY injection Inject 0.5 mLs (100 mcg total) into the vein every Wednesday with hemodialysis. 05/08/17   Gwynn Burly, DO  Darunavir-Cobicisctat-Emtricitabine-Tenofovir Alafenamide [facility]) 800-150-200-10 MG TABS Take 1 tablet by mouth daily with breakfast. 10/07/17   Blanchard Kelch, NP  Dolutegravir-Rilpivirine (JULUCA) 50-25 MG TABS Take 1 tablet by mouth daily with supper. Patient taking differently: Take 1 tablet by mouth daily with breakfast.  10/07/17   Blanchard Kelch, NP  gabapentin (NEURONTIN) 100 MG capsule TAKE 1 CAPSULE (100 MG TOTAL) BY MOUTH TWO (TWO) TIMES DAILY. 10/14/17   Blanchard Kelch, NP  HYDROcodone-acetaminophen (NORCO/VICODIN) 5-325 MG tablet Take 1 tablet by mouth  every 4 (four) hours as needed. 12/03/17   Jacalyn Lefevre, MD  metoprolol succinate (TOPROL XL) 25 MG 24 hr tablet Take 1 tablet (25 mg total) by mouth daily. TAKE WITH 50 MG tablet. 01/02/18   Blanchard Kelch, NP  metoprolol succinate (TOPROL XL) 50 MG 24 hr tablet Take 1 tablet (50 mg total) by mouth daily. Take with or immediately following a meal. 05/17/17 05/17/18  Lorenso Courier, MD  multivitamin  (RENA-VIT) TABS tablet Take 1 tablet by mouth at bedtime. Patient taking differently: Take 1 tablet by mouth daily with breakfast.  05/06/17   Gwynn Burly, DO  sulfamethoxazole-trimethoprim (BACTRIM,SEPTRA) 400-80 MG tablet Take 1 tablet by mouth daily. Patient taking differently: Take 1 tablet by mouth daily with breakfast.  10/07/17   Blanchard Kelch, NP    Family History Family History  Problem Relation Age of Onset  . Diabetes Mother     Social History Social History   Tobacco Use  . Smoking status: Current Every Day Smoker    Packs/day: 0.25    Types: Cigarettes    Start date: 09/24/2002  . Smokeless tobacco: Never Used  Substance Use Topics  . Alcohol use: No  . Drug use: No     Allergies   Tomato   Review of Systems Review of Systems  All other systems reviewed and are negative.    Physical Exam Updated Vital Signs BP (!) 145/102 (BP Location: Left Arm)   Pulse 80   Temp 97.6 F (36.4 C) (Oral)   Resp 18   Ht  (1.854 m)   Wt 108.9 kg (240 lb)   SpO2 100%   BMI 31.66 kg/m   Physical Exam  Nursing note and vitals reviewed.  37 year old male, resting comfortably and in no acute distress. Vital signs are significant for elevated blood pressure. Oxygen saturation is 100%, which is normal. Head is normocephalic and atraumatic. PERRLA, EOMI. Oropharynx is clear. Neck is nontender and supple without adenopathy or JVD. Back is nontender and there is no CVA tenderness. Lungs are clear without rales, wheezes, or rhonchi. Chest is nontender. Heart has regular rate and rhythm without murmur. Abdomen is soft, flat, with mild tenderness in the right mid and upper abdomen.  Surgical scars are healing well without any erythema or drainage.  There are no masses or hepatosplenomegaly and peristalsis is normoactive. Extremities have no cyanosis or edema, full range of motion is present.  AV fistula is present in the right upper arm with thrill present. Skin is  warm and dry without rash. Neurologic: Mental status is normal, cranial nerves are intact, there are no motor or sensory deficits.  ED Treatments / Results  Labs (all labs ordered are listed, but only abnormal results are displayed) Labs Reviewed  COMPREHENSIVE METABOLIC PANEL - Abnormal; Notable for the following components:      Result Value   Sodium 133 (*)    Chloride 96 (*)    Glucose, Bld 114 (*)    BUN 57 (*)    Creatinine, Ser 12.92 (*)    Calcium 7.9 (*)    Albumin 2.6 (*)    AST 42 (*)    ALT 88 (*)    GFR calc non Af Amer 4 (*)    GFR calc Af Amer 5 (*)    All other components within normal limits  CBC - Abnormal; Notable for the following components:   WBC 18.6 (*)    RBC 3.42 (*)  Hemoglobin 10.7 (*)    HCT 32.6 (*)    RDW 17.6 (*)    Platelets 136 (*)    All other components within normal limits  LIPASE, BLOOD   Radiology Ct Abdomen Pelvis W Contrast  Result Date: 01/19/2018 CLINICAL DATA:  Abdominal pain after cholecystectomy on Friday. Dialysis patient. EXAM: CT ABDOMEN AND PELVIS WITH CONTRAST TECHNIQUE: Multidetector CT imaging of the abdomen and pelvis was performed using the standard protocol following bolus administration of intravenous contrast. CONTRAST:  100 mL Isovue-300 COMPARISON:  None. FINDINGS: Lower chest: Limited by motion artifact. No acute abnormality suggested. Hepatobiliary: Surgical absence of the gallbladder. Small amount of fluid in the gallbladder fossa is consistent with postoperative change. No bile duct dilatation. No focal liver lesions. Pancreas: Unremarkable. No pancreatic ductal dilatation or surrounding inflammatory changes. Spleen: Normal in size without focal abnormality. Adrenals/Urinary Tract: Adrenal glands are unremarkable. Kidneys are normal, without renal calculi, focal lesion, or hydronephrosis. Bladder is unremarkable. Stomach/Bowel: Stomach is within normal limits. Appendix appears normal. No evidence of bowel wall  thickening, distention, or inflammatory changes. Vascular/Lymphatic: No significant vascular findings are present. No enlarged abdominal or pelvic lymph nodes. Reproductive: Prostate is unremarkable. Other: Scattered free air in the upper abdomen with mild subcutaneous emphysema. These are consistent with postoperative changes given the history of recent surgery. Small amount of free fluid in the pelvis is likely postoperative. Abdominal wall musculature appears intact. Musculoskeletal: No acute or significant osseous findings. IMPRESSION: 1. Postoperative cholecystectomy. Small amount of fluid in the gallbladder fossa likely represents postoperative change. 2. Free air in the abdomen and subcutaneous emphysema in the body wall consistent with postoperative changes. 3. Small amount of free fluid in the pelvis likely also postoperative period 4. No acute process otherwise demonstrated. Electronically Signed   By: Burman Nieves M.D.   On: 01/19/2018 04:55   Ir Dialy Shunt Intro Needle/intracath Initial W/img Right  Result Date: 01/17/2018 CLINICAL DATA:  End stage renal disease, high venous pressures, difficult cannulation during hemodialysis EXAM: DIALYSIS FISTULOGRAM FLUOROSCOPY TIME:  0.1 minutes; 5 uGym2 DAP COMPARISON:  None TECHNIQUE: An 18-gauge angiocatheter was placed antegrade into the outflow vein of the patient's native right upper arm brachiocephalic AV hemodialysis fistula for dialysis fistulography. No immediate complication. FINDINGS: The outflow cephalic vein is widely patent, with mild aneurysmal dilatation just central to the fistula. There are 2 large competing outflow branches several cm central to the level of the fistula, flowing into the paired brachial veins. Central venous outflow through the SVC is widely patent. Induced reflux across the brachiocephalic fistula just above the elbow shows this to be widely patent. IMPRESSION: 1. No significant outflow stenosis or occlusion. 2. 2 large  competing outflow veins several cm central to the AV fistula. Consider surgical ligation. Electronically Signed   By: Corlis Leak M.D.   On: 01/17/2018 14:15    Procedures Procedures   Medications Ordered in ED Medications  azithromycin (ZITHROMAX) tablet 1,200 mg (has no administration in time range)  morphine 4 MG/ML injection 4 mg (4 mg Intravenous Given 01/19/18 0041)  ciprofloxacin (CIPRO) IVPB 400 mg (0 mg Intravenous Stopped 01/19/18 0206)  piperacillin-tazobactam (ZOSYN) IVPB 2.25 g (0 g Intravenous Stopped 01/19/18 0121)  iopamidol (ISOVUE-300) 61 % injection 100 mL (100 mLs Intravenous Contrast Given 01/19/18 0339)  sulfamethoxazole-trimethoprim (BACTRIM DS,SEPTRA DS) 800-160 MG per tablet 1 tablet (1 tablet Oral Given 01/19/18 0630)     Initial Impression / Assessment and Plan / ED Course  I have reviewed  the triage vital signs and the nursing notes.  Pertinent labs & imaging results that were available during my care of the patient were reviewed by me and considered in my medical decision making (see chart for details).  Abdominal pain on second day postoperative cholecystectomy.  Medication noncompliance.  Abdominal exam is benign, but will check CT to make sure no serious complications.  Labs are reviewed and it is noted that he has elevated WBC.  He did miss dialysis today, but potassium is normal and he is not fluid overloaded so there is no need for emergent or urgent dialysis.  Old records are reviewed confirming hospitalization for cholecystitis and laparoscopic cholecystectomy performed 2 days ago.  CT scan shows no acute process, appropriate postoperative changes noted.  I asked the patient if he would be able to get his prescriptions filled today if discharged, and he stated that he would not be able to get them filled for at least 2 days.  We will hold the patient in the ED to see if case management can help him with getting prescriptions filled.  Final Clinical  Impressions(s) / ED Diagnoses   Final diagnoses:  Post-op pain  Noncompliance with medication regimen  End-stage renal disease on hemodialysis (HCC)  HIV disease (HCC)  Anemia associated with chronic renal failure    ED Discharge Orders        Ordered    amoxicillin-clavulanate (AUGMENTIN) 500-125 MG tablet  Daily at bedtime     01/19/18 0806    ciprofloxacin (CIPRO) 500 MG tablet  Daily with breakfast     01/19/18 0806    Darunavir-Cobicisctat-Emtricitabine-Tenofovir Alafenamide (SYMTUZA) 800-150-200-10 MG TABS  Daily with breakfast    Note to Pharmacy:  Regimen should be Juluca+Symtuza   01/19/18 0806    Dolutegravir-Rilpivirine (JULUCA) 50-25 MG TABS  Daily with breakfast     01/19/18 0806    sulfamethoxazole-trimethoprim (BACTRIM,SEPTRA) 400-80 MG tablet  Daily with breakfast     01/19/18 0806       Dione Booze, MD 01/19/18 [phone]

## 2018-01-18 NOTE — ED Triage Notes (Addendum)
Patient is a dialysis patient c/o abd pain. Was discharged from here on Friday post cholecystectomy and was suppose to have dialysis today but did not go. States that he was unable to get his abx prescription filled on d/c

## 2018-01-19 ENCOUNTER — Emergency Department (HOSPITAL_COMMUNITY): Payer: Medicaid Other

## 2018-01-19 MED ORDER — DOLUTEGRAVIR-RILPIVIRINE 50-25 MG PO TABS: 1.0000 | ORAL_TABLET | Freq: Every day | ORAL | 0 refills | Status: AC

## 2018-01-19 MED ORDER — DARUN-COBIC-EMTRICIT-TENOFAF 800-150-200-10 MG PO TABS: 1.0000 | ORAL_TABLET | Freq: Every day | ORAL | 0 refills | Status: AC

## 2018-01-19 MED ORDER — IOPAMIDOL (ISOVUE-300) INJECTION 61%
100.0000 mL | Freq: Once | INTRAVENOUS | Status: AC | PRN
  Administered 2018-01-19: 100 mL via INTRAVENOUS

## 2018-01-19 MED ORDER — AMOXICILLIN-POT CLAVULANATE 500-125 MG PO TABS: 1.0000 | ORAL_TABLET | Freq: Every day | ORAL | 0 refills | Status: AC

## 2018-01-19 MED ORDER — IOPAMIDOL (ISOVUE-300) INJECTION 61%
INTRAVENOUS | Status: AC
  Filled 2018-01-19: qty 30

## 2018-01-19 MED ORDER — AZITHROMYCIN 600 MG PO TABS
1200.0000 mg | ORAL_TABLET | Freq: Once | ORAL | Status: DC
  Filled 2018-01-19: qty 2

## 2018-01-19 MED ORDER — SULFAMETHOXAZOLE-TRIMETHOPRIM 800-160 MG PO TABS
1.0000 | ORAL_TABLET | Freq: Once | ORAL | Status: AC
  Administered 2018-01-19: 1 via ORAL
  Filled 2018-01-19: qty 1

## 2018-01-19 MED ORDER — CIPROFLOXACIN HCL 500 MG PO TABS: 500.0000 mg | ORAL_TABLET | Freq: Every day | ORAL | 0 refills | Status: AC

## 2018-01-19 MED ORDER — SULFAMETHOXAZOLE-TRIMETHOPRIM 400-80 MG PO TABS: 1.0000 | ORAL_TABLET | Freq: Every day | ORAL | 0 refills | Status: AC

## 2018-01-19 MED ORDER — IOPAMIDOL (ISOVUE-300) INJECTION 61%
INTRAVENOUS | Status: AC
  Filled 2018-01-19: qty 100

## 2018-01-19 NOTE — ED Notes (Signed)
Patient drinking 2nd CT bottle.

## 2018-01-19 NOTE — ED Notes (Signed)
Patient returned from CT

## 2018-01-19 NOTE — Discharge Instructions (Signed)
Make sure to take your antibiotics as prescribed.  Go for your dialysis on Tuesday, as scheduled.   Make sure to take your HIV medications as prescribed.

## 2018-01-19 NOTE — ED Notes (Signed)
Patient drinking 1st CT bottle.

## 2018-01-19 NOTE — ED Notes (Signed)
ED Provider at bedside. 

## 2018-01-20 NOTE — Anesthesia Postprocedure Evaluation (Signed)
Anesthesia Post Note  Patient: James Schwartz  Procedure(s) Performed: LAPAROSCOPIC CHOLECYSTECTOMY WITH INTRAOPERATIVE CHOLANGIOGRAM (N/A Abdomen)     Patient location during evaluation: PACU Anesthesia Type: General Level of consciousness: awake and alert, oriented and patient cooperative Pain management: pain level controlled Vital Signs Assessment: post-procedure vital signs reviewed and stable Respiratory status: spontaneous breathing, nonlabored ventilation, respiratory function stable and patient connected to nasal cannula oxygen Cardiovascular status: blood pressure returned to baseline and stable Postop Assessment: no apparent nausea or vomiting Anesthetic complications: no    Last Vitals:  Vitals:   01/17/18 0528 01/17/18 1348  BP: (!) 147/119 (!) 140/112  Pulse: 83 78  Resp:  16  Temp:  36.8 C  SpO2: 99% 100%    Last Pain:  Vitals:   01/17/18 1348  TempSrc: Oral  PainSc:    Pain Goal:                 [name],[name]

## 2018-01-20 NOTE — Discharge Planning (Signed)
[facility] consulted in regards to medication assistance.  Pt has insurance coverage and is not eligible for [facility] program.  Pt listed as "off the floor," will call number listed for further information.

## 2018-01-29 ENCOUNTER — Ambulatory Visit: Payer: Medicaid Other | Admitting: Surgery

## 2018-02-20 ENCOUNTER — Ambulatory Visit: Payer: Medicaid Other | Admitting: Infectious Diseases

## 2018-03-27 IMAGING — US US RENAL
1 series · 14 of 25 positions shown · non-contrast
Comparison: None.

CLINICAL DATA: Acute renal failure, history of HIV

EXAM:
RENAL / URINARY TRACT ULTRASOUND COMPLETE

[Series 1: us renal · 0.30mm/px · 14 of 28 slices shown]
[im 1/28]
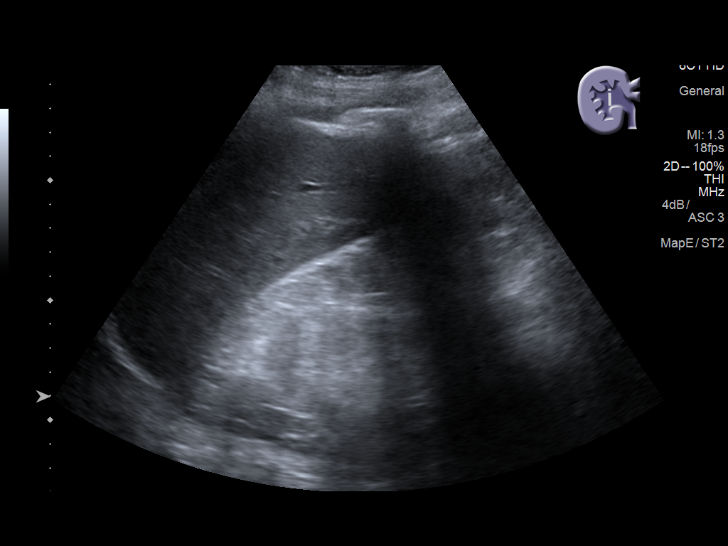
[im 3/28]
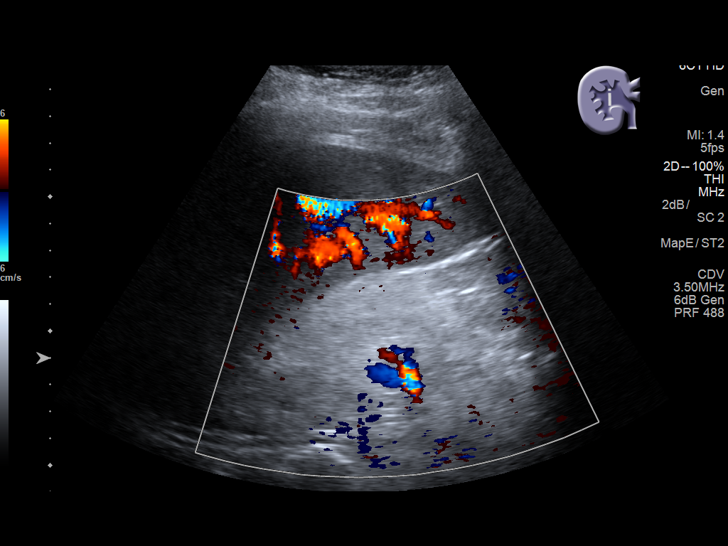
[im 5/28]
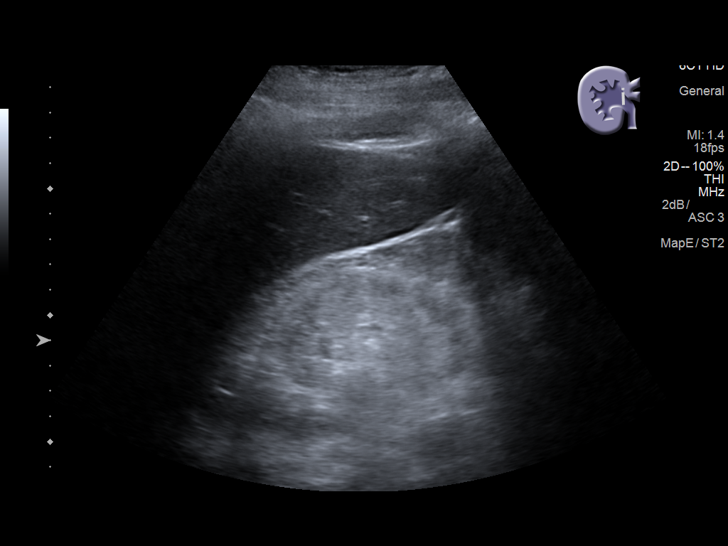
[im 7/28]
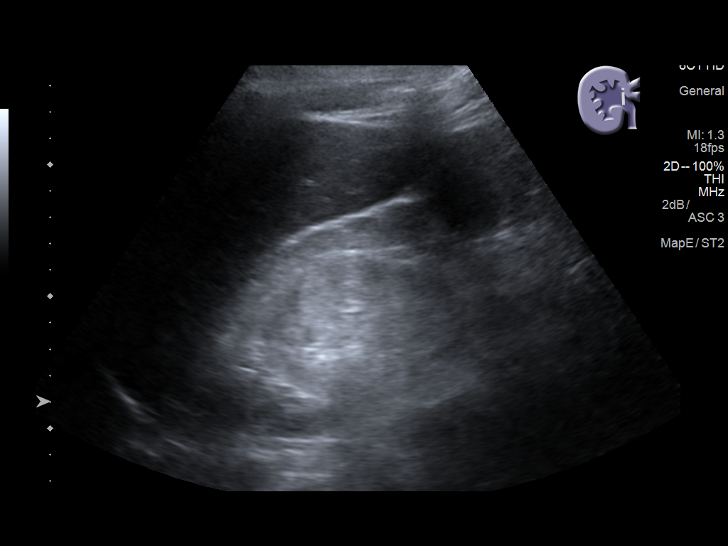
[im 10/28]
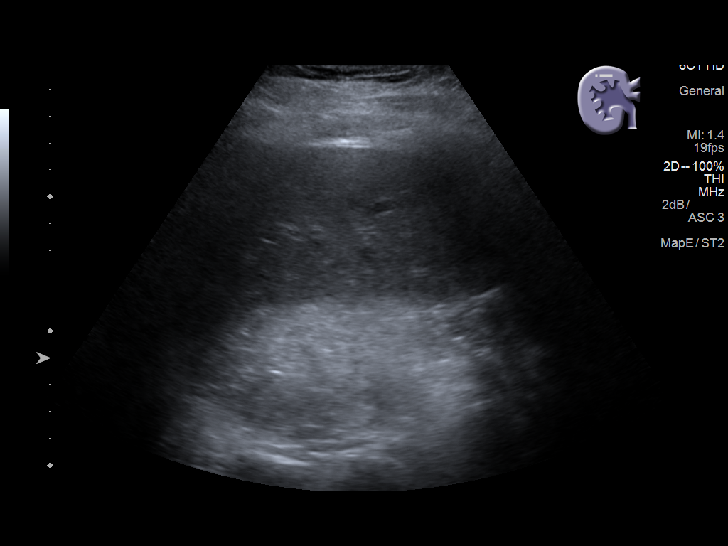
[im 11/28]
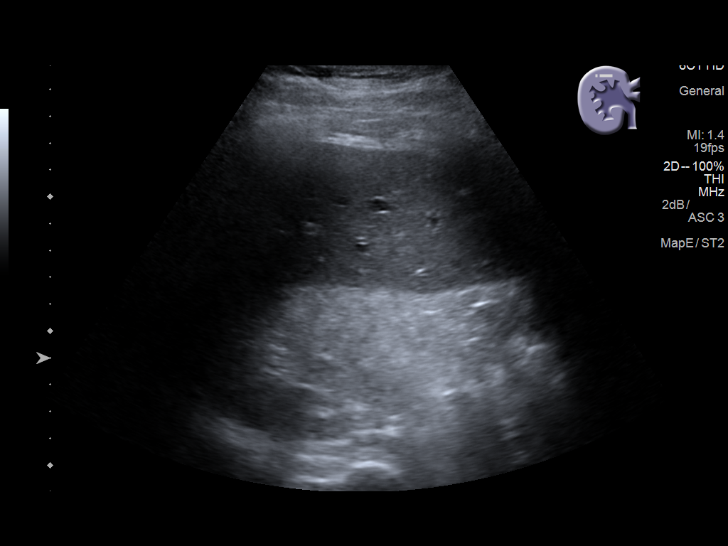
[im 13/28]
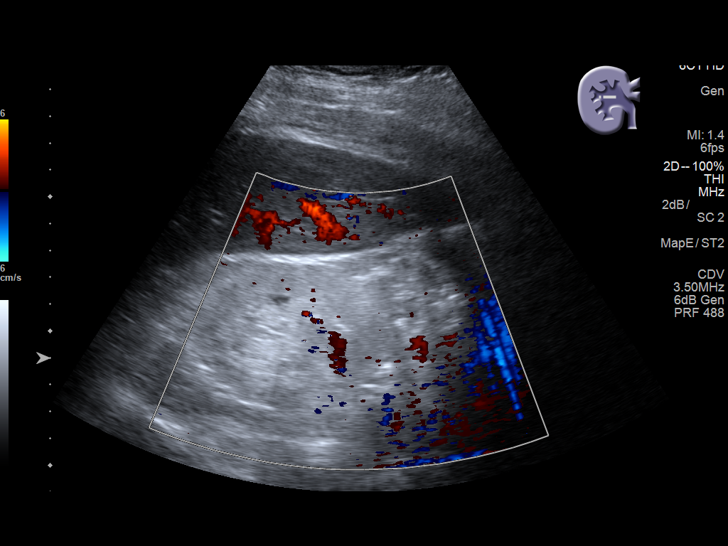
[im 15/28]
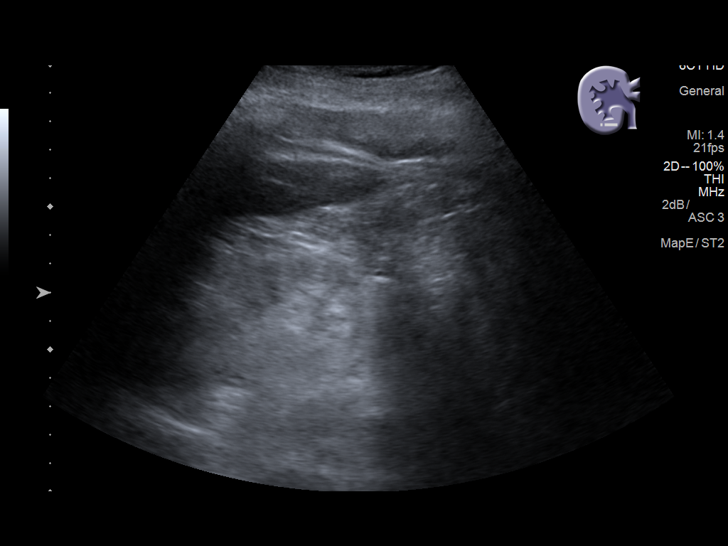
[im 17/28]
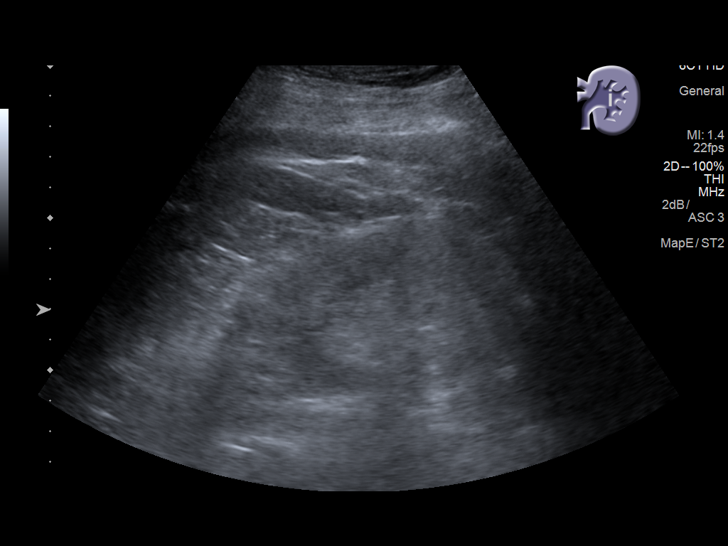
[im 19/28]
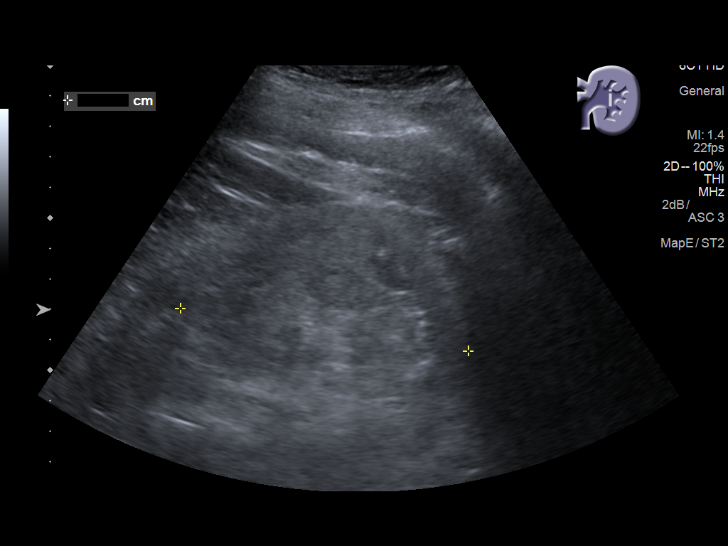
[im 21/28]
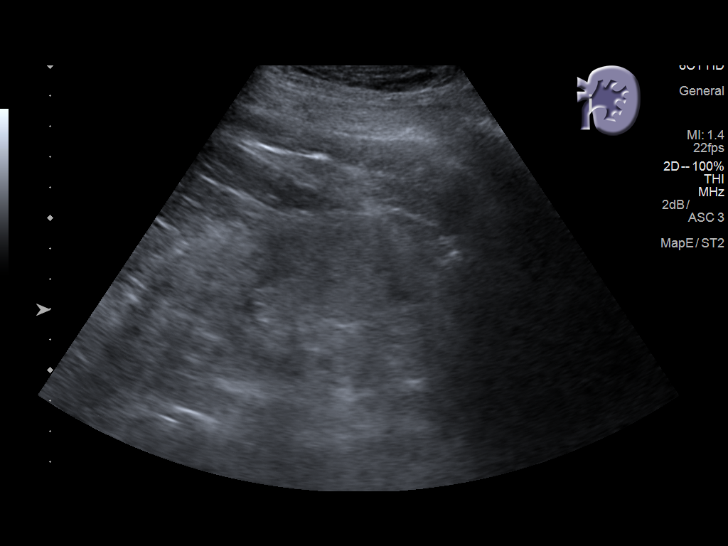
[im 23/28]
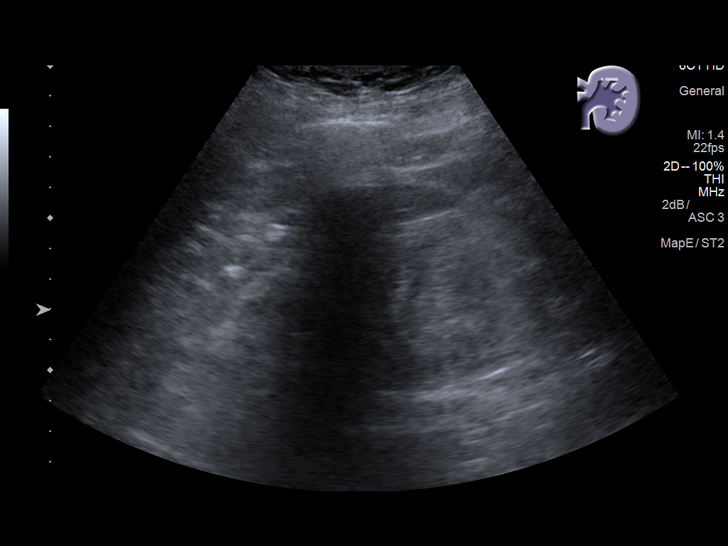
[im 25/28]
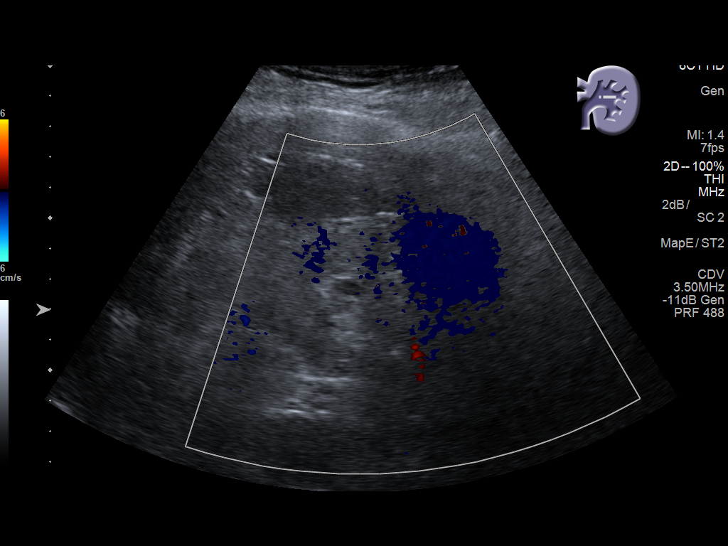
[im 28/28]
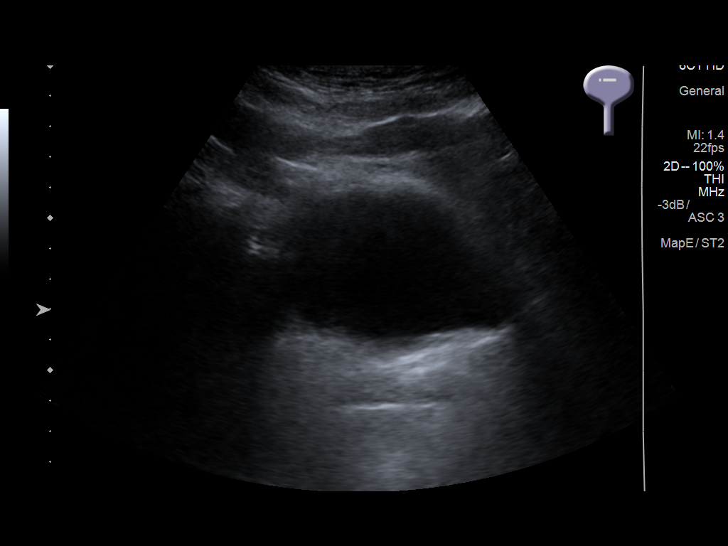

[14 of 25 positions shown; findings below may reference images not displayed]

FINDINGS: Right Kidney:

Length: 8.9 cm. Echogenicity of the kidney is increased with loss of
cortical-medullary distinction. Slight cortical thinning is also
noted. No mass or hydronephrosis visualized.

Left Kidney:

Length: 9.1 cm. Echogenicity is increased with cortical thinning. No
mass or hydronephrosis visualized.

Bladder:

Appears normal for degree of bladder distention.
IMPRESSION: Loss of cortical-medullary distinction with bilateral cortical
thinning consistent with medical renal disease. No obstructive
uropathy or nephrolithiasis.

## 2018-12-18 ENCOUNTER — Other Ambulatory Visit: Payer: Self-pay | Admitting: Infectious Diseases

## 2018-12-18 DIAGNOSIS — G63 Polyneuropathy in diseases classified elsewhere: Secondary | ICD-10-CM

## 2018-12-18 DIAGNOSIS — B20 Human immunodeficiency virus [HIV] disease: Secondary | ICD-10-CM

## 2019-04-28 IMAGING — XA IR US GUIDE VASC ACCESS RIGHT
1 series · 1 of 1 positions shown · non-contrast
Comparison: none

CLINICAL DATA: Renal failure, needs access for hemodialysis

EXAM:
EXAM
RIGHT IJ CATHETER PLACEMENT UNDER ULTRASOUND AND FLUOROSCOPIC
GUIDANCE
TECHNIQUE: The procedure, risks (including but not limited to bleeding,
infection, organ damage, pneumothorax), benefits, and alternatives
were explained to the patient. Questions regarding the procedure
were encouraged and answered. The patient understands and consents
to the procedure. Patency of the right IJ vein was confirmed with
ultrasound with image documentation. An appropriate skin site was
determined. Skin site was marked. Region was prepped using maximum
barrier technique including cap and mask, sterile gown, sterile
gloves, large sterile sheet, and Chlorhexidine as cutaneous
antisepsis. The region was infiltrated locally with 1% lidocaine.
Under real-time ultrasound guidance, the right IJ vein was accessed
with a 21 gauge needle; the needle tip within the vein was confirmed
with ultrasound image documentation. The needle exchanged over a 018
guidewire for vascular dilator which allowed advancement of a 20 cm
Mahurkar catheter. This was positioned with the tip at the
cavoatrial junction. Spot chest radiograph shows good positioning
and no pneumothorax. Catheter was flushed and sutured externally
with 0-Prolene sutures. Patient tolerated the procedure well.
FLUOROSCOPY TIME:  Less than 6 seconds, 10 u4ymE DAP
COMPLICATIONS:
COMPLICATIONS
none

[Series 1: fl (-) angio · 1 of 1 slices shown]
[im 1/1]
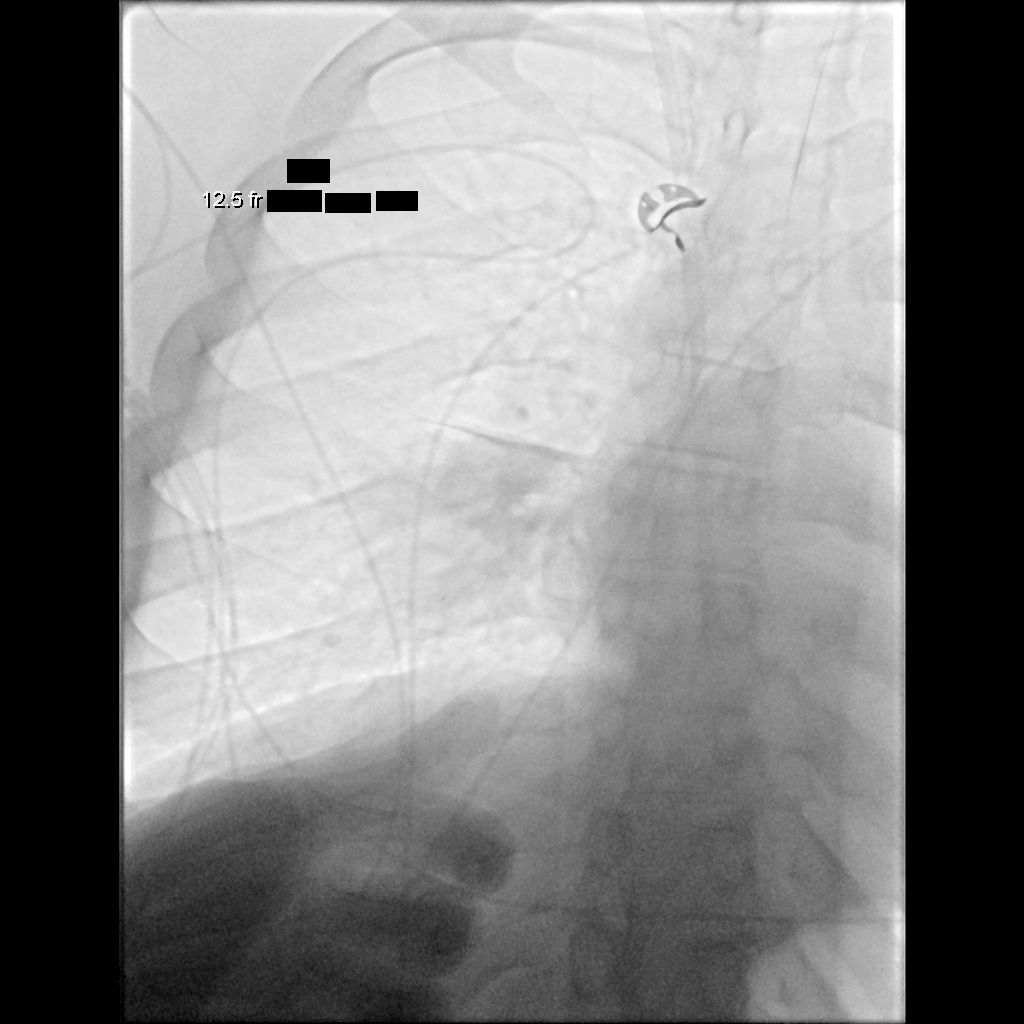

[1 of 1 positions shown; findings below may reference images not displayed]

IMPRESSION: 1. Technically successful right IJ Mahurkar catheter placement.
